# Patient Record
Sex: Female | Born: 1970 | Race: Black or African American | Hispanic: No | Marital: Single | Smoking: Never smoker
Health system: Southern US, Community
[De-identification: ages and names within clinical notes are randomized; demographics above are authoritative.]

## PROBLEM LIST (undated history)

## (undated) DIAGNOSIS — I219 Acute myocardial infarction, unspecified: Secondary | ICD-10-CM

## (undated) DIAGNOSIS — D369 Benign neoplasm, unspecified site: Secondary | ICD-10-CM

## (undated) DIAGNOSIS — I10 Essential (primary) hypertension: Secondary | ICD-10-CM

## (undated) DIAGNOSIS — D573 Sickle-cell trait: Secondary | ICD-10-CM

## (undated) DIAGNOSIS — R51 Headache: Secondary | ICD-10-CM

## (undated) DIAGNOSIS — R0600 Dyspnea, unspecified: Secondary | ICD-10-CM

## (undated) DIAGNOSIS — J45909 Unspecified asthma, uncomplicated: Secondary | ICD-10-CM

## (undated) DIAGNOSIS — I499 Cardiac arrhythmia, unspecified: Secondary | ICD-10-CM

## (undated) DIAGNOSIS — R519 Headache, unspecified: Secondary | ICD-10-CM

## (undated) DIAGNOSIS — Z0282 Encounter for adoption services: Secondary | ICD-10-CM

## (undated) DIAGNOSIS — M1990 Unspecified osteoarthritis, unspecified site: Secondary | ICD-10-CM

## (undated) DIAGNOSIS — E119 Type 2 diabetes mellitus without complications: Secondary | ICD-10-CM

## (undated) DIAGNOSIS — G4730 Sleep apnea, unspecified: Secondary | ICD-10-CM

## (undated) DIAGNOSIS — Z789 Other specified health status: Secondary | ICD-10-CM

## (undated) HISTORY — PX: TUBAL LIGATION: SHX77

## (undated) HISTORY — PX: BREAST SURGERY: SHX581

## (undated) HISTORY — PX: BREAST EXCISIONAL BIOPSY: SUR124

## (undated) HISTORY — DX: Sickle-cell trait: D57.3

---

## 2009-10-15 ENCOUNTER — Emergency Department: Payer: Self-pay | Admitting: Emergency Medicine

## 2012-03-10 ENCOUNTER — Ambulatory Visit: Payer: Self-pay

## 2013-01-03 ENCOUNTER — Emergency Department: Payer: Self-pay | Admitting: Internal Medicine

## 2013-05-29 ENCOUNTER — Ambulatory Visit: Payer: Self-pay | Admitting: Obstetrics and Gynecology

## 2013-05-29 LAB — PREGNANCY, URINE: Pregnancy Test, Urine: NEGATIVE m[IU]/mL

## 2013-05-29 LAB — HEMOGLOBIN: HGB: 13.6 g/dL (ref 12.0–16.0)

## 2013-06-04 ENCOUNTER — Ambulatory Visit: Payer: Self-pay | Admitting: Obstetrics and Gynecology

## 2014-03-08 HISTORY — PX: TUMOR EXCISION: SHX421

## 2014-06-29 NOTE — Op Note (Signed)
PATIENT NAME:  Destiny Singh, Destiny Singh MR#:  [id number] DATE OF BIRTH:  12/28/1970  DATE OF PROCEDURE:  06/04/2013  PREOPERATIVE DIAGNOSIS: 1.  Multiparous,  desiring permanent sterilization.   POSTOPERATIVE DIAGNOSIS:   1.  Multiparous, desiring permanent sterilization.  OPERATION PERFORMED: Includes laparoscopic bilateral tubal ligation.   ANESTHESIA: General.   SURGEON: Rubie Maid, M.D.   EBL: Minimal.  OPERATIVE FLUIDS: 1400 mL of lactated Ringer's.   URINE OUTPUT:  150 mL.   COMPLICATIONS: None.   FINDINGS: On exam under anesthesia, the uterus was sounded to approximately 9 cm. On laparoscopy, there was a normal-appearing uterus, fallopian tubes and ovaries bilaterally. Abdomen and pelvis were noted to be free of any adhesions.   SPECIMENS: There were none.   DETAILS OF PROCEDURE: The patient was taken to the operating room where she was placed under general anesthesia without difficulty. She was then placed in the dorsal lithotomy position using the Allen stirrups and prepped and draped in normal sterile fashion. Next, a straight catheterization was performed with return of 150 mL of clear urine. After this, the speculum was placed into the vagina to visualize the cervix. A single-tooth tenaculum was placed in the anterior lip of the cervix and the uterus was sounded to 9 cm. After this, the sound and the single-tooth tenaculum were removed and a Hulka clamp was placed on the cervix for uterine manipulation. The speculum was then removed from the vagina. Next, attention was then turned to the abdomen where an approximately 5 mm vertical infraumbilical incision was made. After this, a 5 mm trocar and sheath were placed in the abdomen under direct visualization using the laparoscope. Once entrance into the abdominal cavity had been confirmed, a pneumoperitoneum was then established by insufflating the abdomen with CO2 gas. Survey over the abdomen was performed with the above findings noted.  Next, a 5 mm accessory trocar and sheath were placed under direct visualization through a stab incision, approximately 5 fingerbreadths from the umbilicus lateral to the rectus muscles on both sides. Next, the left fallopian tube was grasped approximately 3 cm from the left cornu and fulgurated using bipolar cautery. Two contiguous areas of the tube distal to the initial sites were also cauterized with fulguration noted to extend to the mesosalpinx. This incision was then cut using laparoscopic scissors. The right fallopian tube was then similarly fulgurated in 3 contiguous areas beginning approximately 3 cm from the right cornu with extension of the fulguration to the mesosalpinx. This area was then cut using the laparoscopic scissors. Good hemostasis was noted bilaterally. The accessory sheaths and cautery were removed under direct visualization. The pneumoperitoneum was evacuated. The laparoscope and sheath were then withdrawn and the skin edges were reapproximated using Dermabond. All incisions were then injected using 1% Xylocaine with approximately 10 mL being injected total for all 3 incisions. Instruments were then removed from the vagina and the patient was awakened from general anesthesia without difficulty. Sponge and needle counts were correct.  Estimated blood loss was minimal. There were no complications.   DISPOSITION: The patient will be discharged home later today once awake and alert and discharge medications include Tylenol #3 and Motrin as needed for pain.     ____________________________ Chesley Noon. Marcelline Mates, MD asc:dmm D: 06/04/2013 14:36:36 ET T: 06/04/2013 23:06:57 ET JOB#: [id number]  cc: Chesley Noon. Marcelline Mates, MD, <Dictator> Augusto Gamble MD ELECTRONICALLY SIGNED 06/11/2013 9:07

## 2014-07-25 ENCOUNTER — Other Ambulatory Visit: Payer: Self-pay | Admitting: Nurse Practitioner

## 2014-07-25 ENCOUNTER — Ambulatory Visit
Admission: RE | Admit: 2014-07-25 | Discharge: 2014-07-25 | Disposition: A | Discharge status: Home / Self Care | Payer: Medicaid Other | Source: Ambulatory Visit | Attending: Nurse Practitioner | Admitting: Nurse Practitioner

## 2014-07-25 DIAGNOSIS — M25562 Pain in left knee: Secondary | ICD-10-CM | POA: Diagnosis not present

## 2014-07-25 DIAGNOSIS — M25462 Effusion, left knee: Secondary | ICD-10-CM | POA: Diagnosis not present

## 2014-07-25 DIAGNOSIS — M25569 Pain in unspecified knee: Secondary | ICD-10-CM | POA: Diagnosis present

## 2014-08-09 DIAGNOSIS — M222X9 Patellofemoral disorders, unspecified knee: Secondary | ICD-10-CM | POA: Insufficient documentation

## 2015-02-28 ENCOUNTER — Other Ambulatory Visit: Payer: Self-pay | Admitting: Nurse Practitioner

## 2015-02-28 DIAGNOSIS — M25561 Pain in right knee: Secondary | ICD-10-CM

## 2015-02-28 DIAGNOSIS — M25562 Pain in left knee: Secondary | ICD-10-CM

## 2015-04-17 ENCOUNTER — Emergency Department: Payer: Medicaid Other

## 2015-04-17 ENCOUNTER — Encounter: Payer: Self-pay | Admitting: Emergency Medicine

## 2015-04-17 ENCOUNTER — Emergency Department
Admission: EM | Admit: 2015-04-17 | Discharge: 2015-04-17 | Disposition: A | Discharge status: Home / Self Care | Payer: Medicaid Other | Attending: Emergency Medicine | Admitting: Emergency Medicine

## 2015-04-17 DIAGNOSIS — R112 Nausea with vomiting, unspecified: Secondary | ICD-10-CM | POA: Diagnosis not present

## 2015-04-17 DIAGNOSIS — R1084 Generalized abdominal pain: Secondary | ICD-10-CM | POA: Diagnosis not present

## 2015-04-17 DIAGNOSIS — Z3202 Encounter for pregnancy test, result negative: Secondary | ICD-10-CM | POA: Insufficient documentation

## 2015-04-17 DIAGNOSIS — R1013 Epigastric pain: Secondary | ICD-10-CM | POA: Diagnosis present

## 2015-04-17 LAB — URINALYSIS COMPLETE WITH MICROSCOPIC (ARMC ONLY)
BACTERIA UA: NONE SEEN
Bilirubin Urine: NEGATIVE
Glucose, UA: NEGATIVE mg/dL
LEUKOCYTES UA: NEGATIVE
Nitrite: NEGATIVE
PH: 5 (ref 5.0–8.0)
PROTEIN: NEGATIVE mg/dL
Specific Gravity, Urine: 1.024 (ref 1.005–1.030)

## 2015-04-17 LAB — CBC
HEMATOCRIT: 43.1 % (ref 35.0–47.0)
HEMOGLOBIN: 13.9 g/dL (ref 12.0–16.0)
MCH: 24.9 pg — ABNORMAL LOW (ref 26.0–34.0)
MCHC: 32.3 g/dL (ref 32.0–36.0)
MCV: 77.3 fL — AB (ref 80.0–100.0)
Platelets: 374 10*3/uL (ref 150–440)
RBC: 5.58 MIL/uL — AB (ref 3.80–5.20)
RDW: 16.5 % — AB (ref 11.5–14.5)
WBC: 13.3 10*3/uL — AB (ref 3.6–11.0)

## 2015-04-17 LAB — COMPREHENSIVE METABOLIC PANEL
ALT: 19 U/L (ref 14–54)
AST: 18 U/L (ref 15–41)
Albumin: 3.7 g/dL (ref 3.5–5.0)
Alkaline Phosphatase: 78 U/L (ref 38–126)
Anion gap: 10 (ref 5–15)
BUN: 14 mg/dL (ref 6–20)
CHLORIDE: 104 mmol/L (ref 101–111)
CO2: 22 mmol/L (ref 22–32)
Calcium: 8.9 mg/dL (ref 8.9–10.3)
Creatinine, Ser: 0.7 mg/dL (ref 0.44–1.00)
GFR calc Af Amer: >60 mL/min (ref >60)
Glucose, Bld: 118 mg/dL — ABNORMAL HIGH (ref 65–99)
POTASSIUM: 3.9 mmol/L (ref 3.5–5.1)
Sodium: 136 mmol/L (ref 135–145)
Total Bilirubin: 0.8 mg/dL (ref 0.3–1.2)
Total Protein: 7.5 g/dL (ref 6.5–8.1)

## 2015-04-17 LAB — LIPASE, BLOOD: LIPASE: 22 U/L (ref 11–51)

## 2015-04-17 LAB — PREGNANCY, URINE: PREG TEST UR: NEGATIVE

## 2015-04-17 MED ORDER — SODIUM CHLORIDE 0.9 % IV SOLN
Freq: Once | INTRAVENOUS | Status: AC
  Administered 2015-04-17: 1000 mL via INTRAVENOUS

## 2015-04-17 MED ORDER — MORPHINE SULFATE (PF) 4 MG/ML IV SOLN: 4.0000 mg | Freq: Once | INTRAVENOUS | Status: DC

## 2015-04-17 MED ORDER — MORPHINE SULFATE (PF) 4 MG/ML IV SOLN
4.0000 mg | Freq: Once | INTRAVENOUS | Status: AC
  Administered 2015-04-17: 4 mg via INTRAVENOUS

## 2015-04-17 MED ORDER — IOHEXOL 240 MG/ML SOLN
25.0000 mL | Freq: Once | INTRAMUSCULAR | Status: DC | PRN
  Administered 2015-04-17: 25 mL via ORAL
  Filled 2015-04-17: qty 25

## 2015-04-17 MED ORDER — DICYCLOMINE HCL 20 MG PO TABS: 20.0000 mg | ORAL_TABLET | Freq: Three times a day (TID) | ORAL | Status: DC | PRN

## 2015-04-17 MED ORDER — ONDANSETRON HCL 4 MG PO TABS: 4.0000 mg | ORAL_TABLET | Freq: Every day | ORAL | Status: DC | PRN

## 2015-04-17 MED ORDER — ONDANSETRON HCL 4 MG/2ML IJ SOLN
4.0000 mg | Freq: Once | INTRAMUSCULAR | Status: AC
  Administered 2015-04-17: 4 mg via INTRAVENOUS

## 2015-04-17 MED ORDER — ONDANSETRON 4 MG PO TBDP
4.0000 mg | ORAL_TABLET | Freq: Once | ORAL | Status: AC | PRN
  Administered 2015-04-17: 4 mg via ORAL

## 2015-04-17 MED ORDER — MORPHINE SULFATE (PF) 4 MG/ML IV SOLN
4.0000 mg | Freq: Once | INTRAVENOUS | Status: AC
  Administered 2015-04-17: 4 mg via INTRAVENOUS
  Filled 2015-04-17: qty 1

## 2015-04-17 MED ORDER — ONDANSETRON HCL 4 MG/2ML IJ SOLN
INTRAMUSCULAR | Status: AC
  Administered 2015-04-17: 4 mg via INTRAVENOUS
  Filled 2015-04-17: qty 2

## 2015-04-17 MED ORDER — IOHEXOL 300 MG/ML  SOLN
100.0000 mL | Freq: Once | INTRAMUSCULAR | Status: AC | PRN
  Administered 2015-04-17: 100 mL via INTRAVENOUS
  Filled 2015-04-17: qty 100

## 2015-04-17 MED ORDER — ONDANSETRON 4 MG PO TBDP
ORAL_TABLET | ORAL | Status: AC
  Filled 2015-04-17: qty 1

## 2015-04-17 MED ORDER — MORPHINE SULFATE (PF) 4 MG/ML IV SOLN
INTRAVENOUS | Status: AC
  Administered 2015-04-17: 4 mg via INTRAVENOUS
  Filled 2015-04-17: qty 1

## 2015-04-17 NOTE — ED Notes (Signed)
Vomiting since 230 am, epigastric pain that goes into back and throat

## 2015-04-17 NOTE — ED Notes (Signed)
Took her daughters unknown nausea pill at home - give zofran odt 4mg 

## 2015-04-17 NOTE — ED Provider Notes (Addendum)
[facility] Emergency Department Provider Note     Time seen: ----------------------------------------- 2:00 PM on 04/17/2015 -----------------------------------------    I have reviewed the triage vital signs and the nursing notes.   HISTORY  Chief Complaint Abdominal Pain    HPI 77 [name] is a 45 y.o. female who presents ER for epigastric and left sided abdominal pain that started at 2:30 this morning. She's not had any diarrhea but has had a lot of vomiting. Nothing makes her symptoms better or worse. She denies fevers chills or other complaints, states her daughter has also been sick.   History reviewed. No pertinent past medical history.  There are no active problems to display for this patient.   Past Surgical History  Procedure Laterality Date  . Breast surgery      Allergies Review of patient's allergies indicates no known allergies.  Social History Social History  Substance Use Topics  . Smoking status: Never Smoker   . Smokeless tobacco: None  . Alcohol Use: No    Review of Systems Constitutional: Negative for fever. Eyes: Negative for visual changes. ENT: Negative for sore throat. Cardiovascular: Negative for chest pain. Respiratory: Negative for shortness of breath. Gastrointestinal: Positive for abdominal pain and vomiting Genitourinary: Negative for dysuria. Musculoskeletal: Negative for back pain. Skin: Negative for rash. Neurological: Negative for headaches, focal weakness or numbness.  10-point ROS otherwise negative.  ____________________________________________   PHYSICAL EXAM:  VITAL SIGNS: ED Triage Vitals  Enc Vitals Group     BP 04/17/15 1126 138/92 mmHg     Pulse Rate 04/17/15 1126 108     Resp 04/17/15 1126 18     Temp 04/17/15 1126 98.7 F (37.1 C)     Temp src --      SpO2 04/17/15 1126 100 %     Weight 04/17/15 1126 245 lb (111.131 kg)     Height 04/17/15 1126 5\' 9"  (1.753 m)     Head Cir  --      Peak Flow --      Pain Score 04/17/15 1126 10     Pain Loc --      Pain Edu? --      Excl. in [place]? --     Constitutional: Alert and oriented. Well appearing and in no distress. Eyes: Conjunctivae are normal. PERRL. Normal extraocular movements. ENT   Head: Normocephalic and atraumatic.   Nose: No congestion/rhinnorhea.   Mouth/Throat: Mucous membranes are moist.   Neck: No stridor. Cardiovascular: Normal rate, regular rhythm. Normal and symmetric distal pulses are present in all extremities. No murmurs, rubs, or gallops. Respiratory: Normal respiratory effort without tachypnea nor retractions. Breath sounds are clear and equal bilaterally. No wheezes/rales/rhonchi. Gastrointestinal: Epigastric and left-sided abdominal tenderness, no rebound or guarding. Normal bowel sounds. Musculoskeletal: Nontender with normal range of motion in all extremities. No joint effusions.  No lower extremity tenderness nor edema. Neurologic:  Normal speech and language. No gross focal neurologic deficits are appreciated. Speech is normal. No gait instability. Skin:  Skin is warm, dry and intact. No rash noted. Psychiatric: Mood and affect are normal. Speech and behavior are normal. Patient exhibits appropriate insight and judgment. ____________________________________________  ED COURSE:  Pertinent labs & imaging results that were available during my care of the patient were reviewed by me and considered in my medical decision making (see chart for details). Patient is in no acute distress but does have significant left-sided abdominal tenderness. Obtain CT imaging. ____________________________________________    LABS (  pertinent positives/negatives)  Labs Reviewed  COMPREHENSIVE METABOLIC PANEL - Abnormal; Notable for the following:    Glucose, Bld 118 (*)    All other components within normal limits  CBC - Abnormal; Notable for the following:    WBC 13.3 (*)    RBC 5.58 (*)     MCV 77.3 (*)    MCH 24.9 (*)    RDW 16.5 (*)    All other components within normal limits  LIPASE, BLOOD  URINALYSIS COMPLETEWITH MICROSCOPIC ([facility] ONLY)  POC URINE PREG, ED    RADIOLOGY Images were viewed by me  CT the abdomen and pelvis with contrast IMPRESSION: No acute abnormality noted.  Scattered diverticular are noted. ____________________________________________  FINAL ASSESSMENT AND PLAN  Abdominal pain, vomiting  Plan: Patient with labs and imaging as dictated above. Pain is likely intestinal spasm related. Patient likely with a viral issue. She'll be discharged with Bentyl and Zofran. She is stable for outpatient follow-up.   Earleen Newport, MD   Earleen Newport, MD 04/17/15 [place], MD 04/17/15 [phone]

## 2015-04-17 NOTE — Discharge Instructions (Signed)
Abdominal Pain, Adult °Many things can cause abdominal pain. Usually, abdominal pain is not caused by a disease and will improve without treatment. It can often be observed and treated at home. Your health care provider will do a physical exam and possibly order blood tests and X-rays to help determine the seriousness of your pain. However, in many cases, more time must pass before a clear cause of the pain can be found. Before that point, your health care provider may not know if you need more testing or further treatment. °HOME CARE INSTRUCTIONS °Monitor your abdominal pain for any changes. The following actions may help to alleviate any discomfort you are experiencing: °· Only take over-the-counter or prescription medicines as directed by your health care provider. °· Do not take laxatives unless directed to do so by your health care provider. °· Try a clear liquid diet (broth, tea, or water) as directed by your health care provider. Slowly move to a bland diet as tolerated. °SEEK MEDICAL CARE IF: °· You have unexplained abdominal pain. °· You have abdominal pain associated with nausea or diarrhea. °· You have pain when you urinate or have a bowel movement. °· You experience abdominal pain that wakes you in the night. °· You have abdominal pain that is worsened or improved by eating food. °· You have abdominal pain that is worsened with eating fatty foods. °· You have a fever. °SEEK IMMEDIATE MEDICAL CARE IF: °· Your pain does not go away within 2 hours. °· You keep throwing up (vomiting). °· Your pain is felt only in portions of the abdomen, such as the right side or the left lower portion of the abdomen. °· You pass bloody or black tarry stools. °MAKE SURE YOU: °· Understand these instructions. °· Will watch your condition. °· Will get help right away if you are not doing well or get worse. °  °This information is not intended to replace advice given to you by your health care provider. Make sure you discuss  any questions you have with your health care provider. °  °Document Released: 12/02/2004 Document Revised: 11/13/2014 Document Reviewed: 11/01/2012 °Elsevier Interactive Patient Education ©2016 [employer] ° °Nausea and Vomiting °Nausea is a sick feeling that often comes before throwing up (vomiting). Vomiting is a reflex where stomach contents come out of your mouth. Vomiting can cause severe loss of body fluids (dehydration). Children and elderly adults can become dehydrated quickly, especially if they also have diarrhea. Nausea and vomiting are symptoms of a condition or disease. It is important to find the cause of your symptoms. °CAUSES  °· Direct irritation of the stomach lining. This irritation can result from increased acid production (gastroesophageal reflux disease), infection, food poisoning, taking certain medicines (such as nonsteroidal anti-inflammatory drugs), alcohol use, or tobacco use. °· Signals from the brain. These signals could be caused by a headache, heat exposure, an inner ear disturbance, increased pressure in the brain from injury, infection, a tumor, or a concussion, pain, emotional stimulus, or metabolic problems. °· An obstruction in the gastrointestinal tract (bowel obstruction). °· Illnesses such as diabetes, hepatitis, gallbladder problems, appendicitis, kidney problems, cancer, sepsis, atypical symptoms of a heart attack, or eating disorders. °· Medical treatments such as chemotherapy and radiation. °· Receiving medicine that makes you sleep (general anesthetic) during surgery. °DIAGNOSIS °Your caregiver may ask for tests to be done if the problems do not improve after a few days. Tests may also be done if symptoms are severe or if the reason for the   nausea and vomiting is not clear. Tests may include: °· Urine tests. °· Blood tests. °· Stool tests. °· Cultures (to look for evidence of infection). °· X-rays or other imaging studies. °Test results can help your caregiver make  decisions about treatment or the need for additional tests. °TREATMENT °You need to stay well hydrated. Drink frequently but in small amounts. You may wish to drink water, sports drinks, clear broth, or eat frozen ice pops or gelatin dessert to help stay hydrated. When you eat, eating slowly may help prevent nausea. There are also some antinausea medicines that may help prevent nausea. °HOME CARE INSTRUCTIONS  °· Take all medicine as directed by your caregiver. °· If you do not have an appetite, do not force yourself to eat. However, you must continue to drink fluids. °· If you have an appetite, eat a normal diet unless your caregiver tells you differently. °¨ Eat a variety of complex carbohydrates (rice, wheat, potatoes, bread), lean meats, yogurt, fruits, and vegetables. °¨ Avoid high-fat foods because they are more difficult to digest. °· Drink enough water and fluids to keep your urine clear or pale yellow. °· If you are dehydrated, ask your caregiver for specific rehydration instructions. Signs of dehydration may include: °¨ Severe thirst. °¨ Dry lips and mouth. °¨ Dizziness. °¨ Dark urine. °¨ Decreasing urine frequency and amount. °¨ Confusion. °¨ Rapid breathing or pulse. °SEEK IMMEDIATE MEDICAL CARE IF:  °· You have blood or brown flecks (like coffee grounds) in your vomit. °· You have black or bloody stools. °· You have a severe headache or stiff neck. °· You are confused. °· You have severe abdominal pain. °· You have chest pain or trouble breathing. °· You do not urinate at least once every 8 hours. °· You develop cold or clammy skin. °· You continue to vomit for longer than 24 to 48 hours. °· You have a fever. °MAKE SURE YOU:  °· Understand these instructions. °· Will watch your condition. °· Will get help right away if you are not doing well or get worse. °  °This information is not intended to replace advice given to you by your health care provider. Make sure you discuss any questions you have with  your health care provider. °  °Document Released: 02/22/2005 Document Revised: 05/17/2011 Document Reviewed: 07/22/2010 °Elsevier Interactive Patient Education ©2016 [employer] ° °

## 2015-04-22 ENCOUNTER — Ambulatory Visit
Admission: RE | Admit: 2015-04-22 | Discharge: 2015-04-22 | Disposition: A | Discharge status: Home / Self Care | Payer: Medicaid Other | Source: Ambulatory Visit | Attending: Nurse Practitioner | Admitting: Nurse Practitioner

## 2015-04-22 DIAGNOSIS — M25562 Pain in left knee: Secondary | ICD-10-CM | POA: Insufficient documentation

## 2015-04-22 DIAGNOSIS — M25561 Pain in right knee: Secondary | ICD-10-CM

## 2015-04-22 DIAGNOSIS — M1711 Unilateral primary osteoarthritis, right knee: Secondary | ICD-10-CM | POA: Insufficient documentation

## 2015-06-06 DIAGNOSIS — M2242 Chondromalacia patellae, left knee: Secondary | ICD-10-CM | POA: Insufficient documentation

## 2015-06-06 DIAGNOSIS — M2241 Chondromalacia patellae, right knee: Secondary | ICD-10-CM | POA: Insufficient documentation

## 2015-06-17 ENCOUNTER — Emergency Department
Admission: EM | Admit: 2015-06-17 | Discharge: 2015-06-17 | Disposition: A | Discharge status: Home / Self Care | Payer: Medicaid Other | Attending: Emergency Medicine | Admitting: Emergency Medicine

## 2015-06-17 DIAGNOSIS — Y999 Unspecified external cause status: Secondary | ICD-10-CM | POA: Insufficient documentation

## 2015-06-17 DIAGNOSIS — X509XXA Other and unspecified overexertion or strenuous movements or postures, initial encounter: Secondary | ICD-10-CM | POA: Diagnosis not present

## 2015-06-17 DIAGNOSIS — Y9389 Activity, other specified: Secondary | ICD-10-CM | POA: Insufficient documentation

## 2015-06-17 DIAGNOSIS — Z791 Long term (current) use of non-steroidal anti-inflammatories (NSAID): Secondary | ICD-10-CM | POA: Insufficient documentation

## 2015-06-17 DIAGNOSIS — S39012A Strain of muscle, fascia and tendon of lower back, initial encounter: Secondary | ICD-10-CM | POA: Insufficient documentation

## 2015-06-17 DIAGNOSIS — Y929 Unspecified place or not applicable: Secondary | ICD-10-CM | POA: Insufficient documentation

## 2015-06-17 DIAGNOSIS — M545 Low back pain: Secondary | ICD-10-CM | POA: Diagnosis present

## 2015-06-17 MED ORDER — ORPHENADRINE CITRATE 30 MG/ML IJ SOLN
60.0000 mg | Freq: Once | INTRAMUSCULAR | Status: AC
  Administered 2015-06-17: 60 mg via INTRAMUSCULAR
  Filled 2015-06-17: qty 2

## 2015-06-17 MED ORDER — OXYCODONE HCL 5 MG PO TABS
10.0000 mg | ORAL_TABLET | Freq: Once | ORAL | Status: AC
  Administered 2015-06-17: 10 mg via ORAL
  Filled 2015-06-17: qty 2

## 2015-06-17 MED ORDER — DIAZEPAM 2 MG PO TABS
2.0000 mg | ORAL_TABLET | Freq: Once | ORAL | Status: AC
  Administered 2015-06-17: 2 mg via ORAL
  Filled 2015-06-17: qty 1

## 2015-06-17 MED ORDER — OXYCODONE-ACETAMINOPHEN 5-325 MG PO TABS: 1.0000 | ORAL_TABLET | Freq: Four times a day (QID) | ORAL | Status: DC | PRN

## 2015-06-17 MED ORDER — MELOXICAM 15 MG PO TABS: 15.0000 mg | ORAL_TABLET | Freq: Every day | ORAL | Status: DC

## 2015-06-17 NOTE — ED Notes (Signed)
Pt arrives to ER via POV c/o mid back spasms X 1 [place]. 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 Pt ambulatory but pain when walking. Pt alert and oriented X4, active, cooperative, pt in NAD. RR even and unlabored, color WNL.

## 2015-06-17 NOTE — ED Notes (Addendum)
Pt wheeled to treatment room in wheelchair. Pt states back spasms that go straight up back, denies radiation down legs. Pt states it is difficult for her to stand up straight. Pt states pain began this morning after picking up a pt. Pt states she has a "messed up knee so I can't lift with my knees".

## 2015-06-17 NOTE — ED Notes (Signed)
Called pharmacy to get Norflex injection because pyxis is out.

## 2015-06-17 NOTE — ED Provider Notes (Signed)
CSN: [id number]     Arrival date & time 06/17/15  1935 History   First MD Initiated Contact with Patient 06/17/15 1951     Chief Complaint  Patient presents with  . Back Pain     HPI   45 year old female who presents to the emergency department for evaluation of right mid to lower back pain. She states that while assisting a resident out of her wheelchair, she felt her muscle pull. She states she is now having spasms and a burning sensation. She has had no relief with ibuprofen or ice to hot muscle rub. Pain is much worse with movement.  History reviewed. No pertinent past medical history. Past Surgical History  Procedure Laterality Date  . Breast surgery     No family history on file. Social History  Substance Use Topics  . Smoking status: Never Smoker   . Smokeless tobacco: None  . Alcohol Use: No   OB History    No data available     Review of Systems  Constitutional: Positive for activity change. Negative for fever and chills.  Respiratory: Negative.   Cardiovascular: Negative.   Genitourinary: Negative for dysuria, hematuria, flank pain and difficulty urinating.  Musculoskeletal: Positive for back pain and gait problem.  Skin: Negative for color change, rash and wound.  Neurological: Negative for weakness and numbness.      Allergies  Bee venom  Home Medications   Prior to Admission medications   Medication Sig Start Date End Date Taking? Authorizing Provider  dicyclomine (BENTYL) 20 MG tablet Take 1 tablet (20 mg total) by mouth 3 (three) times daily as needed for spasms. 04/17/15 04/16/16  Earleen Newport, MD  meloxicam (MOBIC) 15 MG tablet Take 1 tablet (15 mg total) by mouth daily. 06/17/15   Victorino Dike, FNP  ondansetron (ZOFRAN) 4 MG tablet Take 1 tablet (4 mg total) by mouth daily as needed for nausea or vomiting. 04/17/15   Earleen Newport, MD  oxyCODONE-acetaminophen (ROXICET) 5-325 MG tablet Take 1 tablet by mouth every 6 (six) hours as needed.  06/17/15   Abiel Antrim B Shady Padron, FNP   BP 111/52 mmHg  Pulse 73  Temp(Src) 97.7 F (36.5 C) (Oral)  Resp 16  Ht 5\' 9"  (1.753 m)  Wt 106.595 kg  BMI 34.69 kg/m2  SpO2 97%  LMP 06/17/2015 Physical Exam  Constitutional: She is oriented to person, place, and time. She appears well-developed and well-nourished.  HENT:  Head: Atraumatic.  Eyes: EOM are normal.  Neck: Normal range of motion. Neck supple.  Pulmonary/Chest: Effort normal.  Abdominal: Soft.  Musculoskeletal:       Lumbar back: She exhibits decreased range of motion, tenderness, pain and spasm. She exhibits no bony tenderness, no swelling, no edema and no deformity.  Neurological: She is alert and oriented to person, place, and time.  Skin: Skin is warm and dry.  Psychiatric: She has a normal mood and affect. Her behavior is normal. Judgment and thought content normal.  Nursing note and vitals reviewed.   ED Course  Procedures (including critical care time) Labs Review Labs Reviewed - No data to display  Imaging Review No results found. I have personally reviewed and evaluated these images and lab results as part of my medical decision-making.   EKG Interpretation None      MDM   Final diagnoses:  Lumbosacral strain, initial encounter    Patient was given Norflex and oxycodone IR while in the emergency department with little relief. She  was then given 2 mg of Valium by mouth with significant relief. She was then able to stand and bear some weight independently. She was advised to stay home for the next 2 days and rest. She was given a work note for the same. She will be prescribed Roxicet 5-3 25 and meloxicam 15 mg tablet. She was instructed to follow-up with her primary care provider for symptoms that are not improving over the next few days. She was instructed to return to the emergency department for symptoms that change or worsen if she is unable schedule an appointment.    Victorino Dike, FNP 06/17/15  [id number]  Earleen Newport, MD 06/17/15 [phone]

## 2015-07-28 ENCOUNTER — Emergency Department
Admission: EM | Admit: 2015-07-28 | Discharge: 2015-07-28 | Disposition: A | Discharge status: Home / Self Care | Payer: Medicaid Other | Attending: Emergency Medicine | Admitting: Emergency Medicine

## 2015-07-28 ENCOUNTER — Encounter: Payer: Self-pay | Admitting: Emergency Medicine

## 2015-07-28 DIAGNOSIS — M545 Low back pain: Secondary | ICD-10-CM | POA: Diagnosis present

## 2015-07-28 DIAGNOSIS — M62830 Muscle spasm of back: Secondary | ICD-10-CM | POA: Diagnosis not present

## 2015-07-28 DIAGNOSIS — I10 Essential (primary) hypertension: Secondary | ICD-10-CM | POA: Diagnosis not present

## 2015-07-28 HISTORY — DX: Essential (primary) hypertension: I10

## 2015-07-28 MED ORDER — DIAZEPAM 5 MG PO TABS
5.0000 mg | ORAL_TABLET | Freq: Once | ORAL | Status: AC
  Administered 2015-07-28: 5 mg via ORAL
  Filled 2015-07-28: qty 1

## 2015-07-28 MED ORDER — IBUPROFEN 800 MG PO TABS: 800.0000 mg | ORAL_TABLET | Freq: Three times a day (TID) | ORAL | Status: DC | PRN

## 2015-07-28 MED ORDER — DIAZEPAM 2 MG PO TABS: 2.0000 mg | ORAL_TABLET | Freq: Three times a day (TID) | ORAL | Status: DC | PRN

## 2015-07-28 MED ORDER — HYDROCODONE-ACETAMINOPHEN 5-325 MG PO TABS
1.0000 | ORAL_TABLET | Freq: Once | ORAL | Status: AC
  Administered 2015-07-28: 1 via ORAL
  Filled 2015-07-28: qty 1

## 2015-07-28 NOTE — ED Notes (Signed)
See triage note   Developed lower back pain this am  W/o injury

## 2015-07-28 NOTE — ED Provider Notes (Signed)
[facility] Emergency Department Provider Note  ____________________________________________  Time seen: Approximately 2:57 PM  I have reviewed the triage vital signs and the nursing notes.   HISTORY  Chief Complaint Back Pain    HPI Destiny Singh is a 45 y.o. female ,NAD, presents to the emergency department with 12 hour history of minimal lower back pain. States that the pain began at 3 AM while she was working. Denies any inciting injury, trauma, fall. Has had back pain several times over the last month. Has been seen by her primary care provider who gave her tizanidine which she states does not help. States she went home after working and took a tizanidine and lay down. States she woke with worsening pain causing her to "fall to her knees".  Does note that the pain can radiate down her right leg. States she asked her daughter to bring her to the emergency department for further treatment. Denies any numbness, weakness, tingling. Denies any lower back pain nor hip pain. No saddle paresthesias nor loss of bowel or bladder control. Patient does note she has not taken her blood pressure medication today, propranolol, because she states it makes her somnolent.   Past Medical History  Diagnosis Date  . Hypertension     There are no active problems to display for this patient.   Past Surgical History  Procedure Laterality Date  . Breast surgery      Current Outpatient Rx  Name  Route  Sig  Dispense  Refill  . diazepam (VALIUM) 2 MG tablet   Oral   Take 1 tablet (2 mg total) by mouth every 8 (eight) hours as needed.   21 tablet   0   . ibuprofen (ADVIL,MOTRIN) 800 MG tablet   Oral   Take 1 tablet (800 mg total) by mouth every 8 (eight) hours as needed (pain).   60 tablet   0     Allergies Bee venom and Other  No family history on file.  Social History Social History  Substance Use Topics  . Smoking status: Never Smoker   . Smokeless tobacco:  None  . Alcohol Use: No     Review of Systems  Constitutional: No fever/chills, fatigue Eyes: No visual changes.  Cardiovascular: No chest pain. Respiratory: No shortness of breath. No wheezing.  Gastrointestinal: No abdominal pain.  No nausea, vomiting.  Musculoskeletal: Positive for back pain. Negative lower extremity pain. Skin: Negative for rash, bruising, redness, swelling, skin sores. Neurological: Negative for headaches, focal weakness or numbness. No saddle paresthesias, loss of bowel or bladder control 10-point ROS otherwise negative.  ____________________________________________   PHYSICAL EXAM:  VITAL SIGNS: ED Triage Vitals  Enc Vitals Group     BP 07/28/15 1346 169/100 mmHg     Pulse Rate 07/28/15 1346 105     Resp 07/28/15 1346 20     Temp 07/28/15 1346 98.6 F (37 C)     Temp Source 07/28/15 1346 Oral     SpO2 07/28/15 1346 96 %     Weight 07/28/15 1346 235 lb (106.595 kg)     Height 07/28/15 1346 5\' 9"  (1.753 m)     Head Cir --      Peak Flow --      Pain Score 07/28/15 1348 10     Pain Loc --      Pain Edu? --      Excl. in [place]? --      Constitutional: Alert and oriented. Well  appearing and in no acute distress but in pain. Eyes: Conjunctivae are normal.  Head: Atraumatic. Neck: Supple with full range of motion. No cervical spine tenderness to palpation. No trapezial muscle spasms noted. Hematological/Lymphatic/Immunilogical: No cervical lymphadenopathy. Cardiovascular: Normal rate, regular rhythm. Normal S1 and S2.  Good peripheral circulation. Respiratory: Normal respiratory effort without tachypnea or retractions. Lungs CTAB with breath sounds noted in all lung fields. Musculoskeletal:  Mild lower thoracic spine tenderness to palpation without step offs or crepitus. Right thoracic muscle spasm. No tenderness to palpation about the lumbar spine. No SI joint tenderness. No lower extremity tenderness nor edema.  No joint effusions. Neurologic:  Normal  speech and language. No gross focal neurologic deficits are appreciated.  Skin:  Skin is warm, dry and intact. No rash noted. Psychiatric: Mood and affect are normal. Speech and behavior are normal. Patient exhibits appropriate insight and judgement.   ____________________________________________   LABS  None  ____________________________________________  EKG  None ____________________________________________  RADIOLOGY  None ____________________________________________    PROCEDURES  Procedure(s) performed: None    Medications  diazepam (VALIUM) tablet 5 mg (5 mg Oral Given 07/28/15 1524)  HYDROcodone-acetaminophen (NORCO/VICODIN) 5-325 MG per tablet 1 tablet (1 tablet Oral Given 07/28/15 1524)     ____________________________________________   INITIAL IMPRESSION / ASSESSMENT AND PLAN / ED COURSE  Patient's diagnosis is consistent with Muscle spasm of back. Patient will be discharged home with prescriptions for diazepam and ibuprofen to take as directed. Patient was making a phone call to her primary care provider in regards to taking her propranolol in conjunction with the diazepam. Discussed with the patient that the combination of diazepam and propranolol would increase her somnolence and may not be safe to take. Patient does not know her resting heart rate when she takes the propranolol. At this time I have advised her not to take the propranolol while taking diazepam until she is able to speak with her primary care provider. She will monitor her blood pressure and pulse while at home and if remains elevated or has any onset of life-threatening symptoms she is to return to this emergency department for further evaluation. Patient is to follow up with her primary care provider if symptoms of muscle spasms persist past this treatment course. Patient is given ED precautions to return to the ED for any worsening or new symptoms.     ____________________________________________  FINAL CLINICAL IMPRESSION(S) / ED DIAGNOSES  Final diagnoses:  Muscle spasm of back      NEW MEDICATIONS STARTED DURING THIS VISIT:  Discharge Medication List as of 07/28/2015  4:40 PM    START taking these medications   Details  diazepam (VALIUM) 2 MG tablet Take 1 tablet (2 mg total) by mouth every 8 (eight) hours as needed., Starting 07/28/2015, Until Tue 07/27/16, Print    ibuprofen (ADVIL,MOTRIN) 800 MG tablet Take 1 tablet (800 mg total) by mouth every 8 (eight) hours as needed (pain)., Starting 07/28/2015, Until Discontinued, [place], PA-C 07/28/15 1704  Nance Pear, MD 07/28/15 1807

## 2015-07-28 NOTE — Discharge Instructions (Signed)
Back Exercises The following exercises strengthen the muscles that help to support the back. They also help to keep the lower back flexible. Doing these exercises can help to prevent back pain or lessen existing pain. If you have back pain or discomfort, try doing these exercises 2-3 times each [place] or as told by your health care provider. When the pain goes away, do them once each [place], but increase the number of times that you repeat the steps for each exercise (do more repetitions). If you do not have back pain or discomfort, do these exercises once each [place] or as told by your health care provider. EXERCISES Single Knee to Chest Repeat these steps 3-5 times for each leg:  Lie on your back on a firm bed or the floor with your legs extended.  Bring one knee to your chest. Your other leg should stay extended and in contact with the floor.  Hold your knee in place by grabbing your knee or thigh.  Pull on your knee until you feel a gentle stretch in your lower back.  Hold the stretch for 10-30 seconds.  Slowly release and straighten your leg. Pelvic Tilt Repeat these steps 5-10 times:  Lie on your back on a firm bed or the floor with your legs extended.  Bend your knees so they are pointing toward the ceiling and your feet are flat on the floor.  Tighten your lower abdominal muscles to press your lower back against the floor. This motion will tilt your pelvis so your tailbone points up toward the ceiling instead of pointing to your feet or the floor.  With gentle tension and even breathing, hold this position for 5-10 seconds. Cat-Cow Repeat these steps until your lower back becomes more flexible:  Get into a hands-and-knees position on a firm surface. Keep your hands under your shoulders, and keep your knees under your hips. You may place padding under your knees for comfort.  Let your head hang down, and point your tailbone toward the floor so your lower back becomes rounded like the  back of a cat.  Hold this position for 5 seconds.  Slowly lift your head and point your tailbone up toward the ceiling so your back forms a sagging arch like the back of a cow.  Hold this position for 5 seconds. Press-Ups Repeat these steps 5-10 times:  Lie on your abdomen (face-down) on the floor.  Place your palms near your head, about shoulder-width apart.  While you keep your back as relaxed as possible and keep your hips on the floor, slowly straighten your arms to raise the top half of your body and lift your shoulders. Do not use your back muscles to raise your upper torso. You may adjust the placement of your hands to make yourself more comfortable.  Hold this position for 5 seconds while you keep your back relaxed.  Slowly return to lying flat on the floor. Bridges Repeat these steps 10 times:  Lie on your back on a firm surface.  Bend your knees so they are pointing toward the ceiling and your feet are flat on the floor.  Tighten your buttocks muscles and lift your buttocks off of the floor until your waist is at almost the same height as your knees. You should feel the muscles working in your buttocks and the back of your thighs. If you do not feel these muscles, slide your feet 1-2 inches farther away from your buttocks.  Hold this position for 3-5  seconds.  Slowly lower your hips to the starting position, and allow your buttocks muscles to relax completely. If this exercise is too easy, try doing it with your arms crossed over your chest. Abdominal Crunches Repeat these steps 5-10 times: 1. Lie on your back on a firm bed or the floor with your legs extended. 2. Bend your knees so they are pointing toward the ceiling and your feet are flat on the floor. 3. Cross your arms over your chest. 4. Tip your chin slightly toward your chest without bending your neck. 5. Tighten your abdominal muscles and slowly raise your trunk (torso) high enough to lift your shoulder blades  a tiny bit off of the floor. Avoid raising your torso higher than that, because it can put too much stress on your low back and it does not help to strengthen your abdominal muscles. 6. Slowly return to your starting position. Back Lifts Repeat these steps 5-10 times: 1. Lie on your abdomen (face-down) with your arms at your sides, and rest your forehead on the floor. 2. Tighten the muscles in your legs and your buttocks. 3. Slowly lift your chest off of the floor while you keep your hips pressed to the floor. Keep the back of your head in line with the curve in your back. Your eyes should be looking at the floor. 4. Hold this position for 3-5 seconds. 5. Slowly return to your starting position. SEEK MEDICAL CARE IF:  Your back pain or discomfort gets much worse when you do an exercise.  Your back pain or discomfort does not lessen within 2 hours after you exercise. If you have any of these problems, stop doing these exercises right away. Do not do them again unless your health care provider says that you can. SEEK IMMEDIATE MEDICAL CARE IF:  You develop sudden, severe back pain. If this happens, stop doing the exercises right away. Do not do them again unless your health care provider says that you can.   This information is not intended to replace advice given to you by your health care provider. Make sure you discuss any questions you have with your health care provider.   Document Released: 04/01/2004 Document Revised: 11/13/2014 Document Reviewed: 04/18/2014 Elsevier Interactive Patient Education 2016 [place] therapy can help ease sore, stiff, injured, and tight muscles and joints. Heat relaxes your muscles, which may help ease your pain. Heat therapy should only be used on old, pre-existing, or long-lasting (chronic) injuries. Do not use heat therapy unless told by your doctor. HOW TO USE HEAT THERAPY There are several different kinds of heat therapy,  including:  Moist heat pack.  Warm water bath.  Hot water bottle.  Electric heating pad.  Heated gel pack.  Heated wrap.  Electric heating pad. GENERAL HEAT THERAPY RECOMMENDATIONS   Do not sleep while using heat therapy. Only use heat therapy while you are awake.  Your skin may turn pink while using heat therapy. Do not use heat therapy if your skin turns red.  Do not use heat therapy if you have new pain.  High heat or long exposure to heat can cause burns. Be careful when using heat therapy to avoid burning your skin.  Do not use heat therapy on areas of your skin that are already irritated, such as with a rash or sunburn. GET HELP IF:   You have blisters, redness, swelling (puffiness), or numbness.  You have new pain.  Your pain is worse.  MAKE SURE YOU:  Understand these instructions.  Will watch your condition.  Will get help right away if you are not doing well or get worse.   This information is not intended to replace advice given to you by your health care provider. Make sure you discuss any questions you have with your health care provider.   Document Released: 05/17/2011 Document Revised: 03/15/2014 Document Reviewed: 04/17/2013 Elsevier Interactive Patient Education 2016 Elsevier Inc.  Muscle Cramps and Spasms Muscle cramps and spasms occur when a muscle or muscles tighten and you have no control over this tightening (involuntary muscle contraction). They are a common problem and can develop in any muscle. The most common place is in the calf muscles of the leg. Both muscle cramps and muscle spasms are involuntary muscle contractions, but they also have differences:   Muscle cramps are sporadic and painful. They may last a few seconds to a quarter of an hour. Muscle cramps are often more forceful and last longer than muscle spasms.  Muscle spasms may or may not be painful. They may also last just a few seconds or much longer. CAUSES  It is uncommon  for cramps or spasms to be due to a serious underlying problem. In many cases, the cause of cramps or spasms is unknown. Some common causes are:   Overexertion.   Overuse from repetitive motions (doing the same thing over and over).   Remaining in a certain position for a long period of time.   Improper preparation, form, or technique while performing a sport or activity.   Dehydration.   Injury.   Side effects of some medicines.   Abnormally low levels of the salts and ions in your blood (electrolytes), especially potassium and calcium. This could happen if you are taking water pills (diuretics) or you are pregnant.  Some underlying medical problems can make it more likely to develop cramps or spasms. These include, but are not limited to:   Diabetes.   Parkinson disease.   Hormone disorders, such as thyroid problems.   Alcohol abuse.   Diseases specific to muscles, joints, and bones.   Blood vessel disease where not enough blood is getting to the muscles.  HOME CARE INSTRUCTIONS   Stay well hydrated. Drink enough water and fluids to keep your urine clear or pale yellow.  It may be helpful to massage, stretch, and relax the affected muscle.  For tight or tense muscles, use a warm towel, heating pad, or hot shower water directed to the affected area.  If you are sore or have pain after a cramp or spasm, applying ice to the affected area may relieve discomfort.  Put ice in a plastic bag.  Place a towel between your skin and the bag.  Leave the ice on for 15-20 minutes, 03-04 times a [place].  Medicines used to treat a known cause of cramps or spasms may help reduce their frequency or severity. Only take over-the-counter or prescription medicines as directed by your caregiver. SEEK MEDICAL CARE IF:  Your cramps or spasms get more severe, more frequent, or do not improve over time.  MAKE SURE YOU:   Understand these instructions.  Will watch your  condition.  Will get help right away if you are not doing well or get worse.   This information is not intended to replace advice given to you by your health care provider. Make sure you discuss any questions you have with your health care provider.   Document Released: 08/14/2001  Document Revised: 06/19/2012 Document Reviewed: 02/09/2012 Elsevier Interactive Patient Education [employer].

## 2015-07-28 NOTE — ED Notes (Signed)
Back pain since early this am, history of same times several months. No new injury.

## 2015-11-16 ENCOUNTER — Encounter: Payer: Self-pay | Admitting: *Deleted

## 2015-11-16 ENCOUNTER — Emergency Department
Admission: EM | Admit: 2015-11-16 | Discharge: 2015-11-16 | Disposition: A | Discharge status: Home / Self Care | Payer: Medicaid Other | Attending: Emergency Medicine | Admitting: Emergency Medicine

## 2015-11-16 DIAGNOSIS — S8391XA Sprain of unspecified site of right knee, initial encounter: Secondary | ICD-10-CM | POA: Insufficient documentation

## 2015-11-16 DIAGNOSIS — X501XXA Overexertion from prolonged static or awkward postures, initial encounter: Secondary | ICD-10-CM | POA: Insufficient documentation

## 2015-11-16 DIAGNOSIS — Z791 Long term (current) use of non-steroidal anti-inflammatories (NSAID): Secondary | ICD-10-CM | POA: Diagnosis not present

## 2015-11-16 DIAGNOSIS — I10 Essential (primary) hypertension: Secondary | ICD-10-CM | POA: Diagnosis not present

## 2015-11-16 DIAGNOSIS — Y990 Civilian activity done for income or pay: Secondary | ICD-10-CM | POA: Insufficient documentation

## 2015-11-16 DIAGNOSIS — Y92009 Unspecified place in unspecified non-institutional (private) residence as the place of occurrence of the external cause: Secondary | ICD-10-CM | POA: Insufficient documentation

## 2015-11-16 DIAGNOSIS — Y9389 Activity, other specified: Secondary | ICD-10-CM | POA: Insufficient documentation

## 2015-11-16 DIAGNOSIS — S8392XA Sprain of unspecified site of left knee, initial encounter: Secondary | ICD-10-CM | POA: Diagnosis not present

## 2015-11-16 DIAGNOSIS — S8992XA Unspecified injury of left lower leg, initial encounter: Secondary | ICD-10-CM | POA: Diagnosis present

## 2015-11-16 MED ORDER — HYDROCODONE-ACETAMINOPHEN 5-325 MG PO TABS: 1.0000 | ORAL_TABLET | ORAL | 0 refills | Status: DC | PRN

## 2015-11-16 NOTE — Discharge Instructions (Signed)
Ice to knees as needed for swelling. Wear brace that you have at home on your right knee for support. Continue taking ibuprofen as needed for inflammation and pain. Norco as needed for severe pain. Be aware that this medication could cause drowsiness and increased your  risk for falling. Follow-up with your primary care doctor if any continued problems.

## 2015-11-16 NOTE — ED Triage Notes (Signed)
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

## 2015-11-16 NOTE — ED Provider Notes (Signed)
[facility] Emergency Department Provider Note  ____________________________________________   First MD Initiated Contact with Patient 11/16/15 [phone]     (approximate)  I have reviewed the triage vital signs and the nursing notes.   HISTORY  Chief Complaint Knee Pain   HPI Destiny Singh is a 45 y.o. female is here with complaint of bilateral knee pain. 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 Patient states that she twisted both knees while getting the resident the floor. Patient states that she has pre-existing injury of both knees from 1-1/2 years ago.Patient states that she does not want to file Workmen's Comp. because of all of her pre-existing knee problems. Patient states she also has rheumatoid arthritis but does not take any medications other than ibuprofen. Patient has continued to ambulate without assistance. She states that her knees are painful with walking. She denies any direct trauma to her knees and states that it was a twisting motion that has flared her knees up. Currently she rates her pain as 10 out of 10.   Past Medical History:  Diagnosis Date  . Hypertension     There are no active problems to display for this patient.   Past Surgical History:  Procedure Laterality Date  . BREAST SURGERY      Prior to Admission medications   Medication Sig Start Date End Date Taking? Authorizing Provider  diazepam (VALIUM) 2 MG tablet Take 1 tablet (2 mg total) by mouth every 8 (eight) hours as needed. 07/28/15 07/27/16  Jami L Hagler, PA-C  HYDROcodone-acetaminophen (NORCO/VICODIN) 5-325 MG tablet Take 1 tablet by mouth every Destiny (four) hours as needed for moderate pain. 11/16/15   Johnn Hai, PA-C  ibuprofen (ADVIL,MOTRIN) 800 MG tablet Take 1 tablet (800 mg total) by mouth every 8 (eight) hours as needed (pain). 07/28/15   Jami L Hagler, PA-C    Allergies Bee venom and Other  History reviewed. No  pertinent family history.  Social History Social History  Substance Use Topics  . Smoking status: Never Smoker  . Smokeless tobacco: Not on file  . Alcohol use No    Review of Systems Constitutional: No fever/chills ENT: No sore throat. Cardiovascular: Denies chest pain. Respiratory: Denies shortness of breath. Gastrointestinal:   No nausea, no vomiting.   Musculoskeletal: Positive history for bilateral knee pain. Skin: Negative for rash. Neurological: Negative for headaches, focal weakness or numbness.  10-point ROS otherwise negative.  ____________________________________________   PHYSICAL EXAM:  VITAL SIGNS: ED Triage Vitals  Enc Vitals Group     BP 11/16/15 0830 128/81     Pulse Rate 11/16/15 0830 91     Resp 11/16/15 0830 18     Temp 11/16/15 0830 98.2 F (36.8 C)     Temp Source 11/16/15 0830 Oral     SpO2 11/16/15 0830 97 %     Weight 11/16/15 0830 230 lb (104.3 kg)     Height 11/16/15 0830 5\' 9"  (1.753 m)     Head Circumference --      Peak Flow --      Pain Score 11/16/15 0831 10     Pain Loc --      Pain Edu? --      Excl. in [place]? --     Constitutional: Alert and oriented. Well appearing and in no acute distress. Eyes: Conjunctivae are normal. PERRL. EOMI. Head: Atraumatic. Nose: No congestion/rhinnorhea. Neck: No stridor.   Cardiovascular:  Normal rate, regular rhythm. Grossly normal heart sounds.  Good peripheral circulation. Respiratory: Normal respiratory effort.  No retractions. Lungs CTAB. Musculoskeletal: On examination of the lower legs there are no gross deformities however there is degenerative changes noted in both knees. Range of motion is slow but patient is able. There is no effusion present. There is some soft tissue edema present. There is crepitus on range of motion. Range of motion is slow and guarded. Ligaments are stable bilaterally. Neurologic:  Normal speech and language. No gross focal neurologic deficits are appreciated. No gait  instability. Skin:  Skin is warm, dry and intact. No ecchymosis, abrasions, erythema was noted. Psychiatric: Mood and affect are normal. Speech and behavior are normal.  ____________________________________________   LABS (all labs ordered are listed, but only abnormal results are displayed)  Labs Reviewed - No data to display  PROCEDURES  Procedure(s) performed: None  Procedures  Critical Care performed: No  ____________________________________________   INITIAL IMPRESSION / ASSESSMENT AND PLAN / ED COURSE  Pertinent labs & imaging results that were available during my care of the patient were reviewed by me and considered in my medical decision making (see chart for details).    Clinical Course  X-rays were deferred at this time since there was no direct trauma to the knees. Patient has a knee immobilizer at home and wear on her right knee since at this time the right one hurts worse than the left. Patient has ibuprofen at home. She will continue taking this medication. She was given a prescription for Norco as needed for severe pain. She is instructed to use ice and elevate as needed. 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 She'll follow-up with her primary care doctor if any continued problems.   ____________________________________________   FINAL CLINICAL IMPRESSION(S) / ED DIAGNOSES  Final diagnoses:  Knee sprain, left, initial encounter  Knee sprain, right, initial encounter      NEW MEDICATIONS STARTED DURING THIS VISIT:  Discharge Medication List as of 11/16/2015  9:15 AM    START taking these medications   Details  HYDROcodone-acetaminophen (NORCO/VICODIN) 5-325 MG tablet Take 1 tablet by mouth every Destiny (four) hours as needed for moderate pain., Starting Sun 11/16/2015, Print         Note:  This document was prepared using Dragon voice recognition software and may include unintentional dictation errors.    Johnn Hai,  PA-C 11/16/15 [place], MD 11/16/15 [phone]

## 2016-01-12 ENCOUNTER — Other Ambulatory Visit: Payer: Self-pay | Admitting: Nurse Practitioner

## 2016-01-12 DIAGNOSIS — Z1231 Encounter for screening mammogram for malignant neoplasm of breast: Secondary | ICD-10-CM

## 2016-01-13 ENCOUNTER — Emergency Department: Payer: Medicaid Other

## 2016-01-13 ENCOUNTER — Emergency Department
Admission: EM | Admit: 2016-01-13 | Discharge: 2016-01-13 | Disposition: A | Discharge status: Home / Self Care | Payer: Medicaid Other | Attending: Emergency Medicine | Admitting: Emergency Medicine

## 2016-01-13 ENCOUNTER — Encounter: Payer: Self-pay | Admitting: Emergency Medicine

## 2016-01-13 DIAGNOSIS — S60221A Contusion of right hand, initial encounter: Secondary | ICD-10-CM | POA: Diagnosis not present

## 2016-01-13 DIAGNOSIS — Y9389 Activity, other specified: Secondary | ICD-10-CM | POA: Diagnosis not present

## 2016-01-13 DIAGNOSIS — S39012A Strain of muscle, fascia and tendon of lower back, initial encounter: Secondary | ICD-10-CM | POA: Insufficient documentation

## 2016-01-13 DIAGNOSIS — X500XXA Overexertion from strenuous movement or load, initial encounter: Secondary | ICD-10-CM | POA: Diagnosis not present

## 2016-01-13 DIAGNOSIS — Y990 Civilian activity done for income or pay: Secondary | ICD-10-CM | POA: Insufficient documentation

## 2016-01-13 DIAGNOSIS — Z79899 Other long term (current) drug therapy: Secondary | ICD-10-CM | POA: Diagnosis not present

## 2016-01-13 DIAGNOSIS — Z791 Long term (current) use of non-steroidal anti-inflammatories (NSAID): Secondary | ICD-10-CM | POA: Insufficient documentation

## 2016-01-13 DIAGNOSIS — Y929 Unspecified place or not applicable: Secondary | ICD-10-CM | POA: Diagnosis not present

## 2016-01-13 DIAGNOSIS — I10 Essential (primary) hypertension: Secondary | ICD-10-CM | POA: Insufficient documentation

## 2016-01-13 DIAGNOSIS — S3992XA Unspecified injury of lower back, initial encounter: Secondary | ICD-10-CM | POA: Diagnosis present

## 2016-01-13 MED ORDER — IBUPROFEN 800 MG PO TABS: 800.0000 mg | ORAL_TABLET | Freq: Three times a day (TID) | ORAL | 0 refills | Status: DC | PRN

## 2016-01-13 MED ORDER — OXYCODONE-ACETAMINOPHEN 7.5-325 MG PO TABS: 1.0000 | ORAL_TABLET | Freq: Four times a day (QID) | ORAL | 0 refills | Status: DC | PRN

## 2016-01-13 MED ORDER — ORPHENADRINE CITRATE 30 MG/ML IJ SOLN
60.0000 mg | Freq: Two times a day (BID) | INTRAMUSCULAR | Status: DC
  Administered 2016-01-13: 60 mg via INTRAMUSCULAR
  Filled 2016-01-13: qty 2

## 2016-01-13 MED ORDER — HYDROMORPHONE HCL 1 MG/ML IJ SOLN
1.0000 mg | Freq: Once | INTRAMUSCULAR | Status: AC
  Administered 2016-01-13: 1 mg via INTRAMUSCULAR
  Filled 2016-01-13: qty 1

## 2016-01-13 MED ORDER — CYCLOBENZAPRINE HCL 10 MG PO TABS: 10.0000 mg | ORAL_TABLET | Freq: Three times a day (TID) | ORAL | 0 refills | Status: DC | PRN

## 2016-01-13 NOTE — ED Triage Notes (Signed)
Pt to ed with c/o back pain and right wrist/hand pain.  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

## 2016-01-13 NOTE — ED Notes (Signed)
States she developed pain to right side of back which moves into right leg yesterday  Also having pain to right hand  States she feels like she has jammed it   Min swelling noted  Ambulates slowly to room w/o limp

## 2016-01-13 NOTE — ED Provider Notes (Signed)
[facility] Emergency Department Provider Note   ____________________________________________   First MD Initiated Contact with Patient 01/13/16 [phone]     (approximate)  I have reviewed the triage vital signs and the nursing notes.   HISTORY  Chief Complaint Hand Pain and Back Pain    HPI Destiny Singh is a 45 y.o. 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 Patient state she hit her hand after trying to lift a patient when she felt a catch in her back. Incident occurred yesterday. Patient states she is noticing edema to the dorsal aspect of her right hand. Patient states she is having pain with flexion and extension of her back.  Patient denies any bladder or bowel dysfunction. Patient stated the pain radiates down her right leg. No palliative measures taken for this complaint.Patient is rating her back pain as a 10 over 10. Patient rates her hand pain as a 5/10. Patient describes back pain as  "sharp". She described her hand pain as "dull".   Past Medical History:  Diagnosis Date  . Hypertension     There are no active problems to display for this patient.   Past Surgical History:  Procedure Laterality Date  . BREAST SURGERY      Prior to Admission medications   Medication Sig Start Date End Date Taking? Authorizing Provider  ciclopirox (LOPROX) 0.77 % cream Apply 1 application topically 2 (two) times daily.  01/05/16  Yes Historical Provider, MD  diazepam (VALIUM) 2 MG tablet Take 1 tablet (2 mg total) by mouth every 8 (eight) hours as needed. 07/28/15 07/27/16 Yes Jami L Hagler, PA-C  doxycycline (VIBRA-TABS) 100 MG tablet Take 1 tablet by mouth daily. 1 tab for twice daily for 14 days - then 1 tab once daily. 12/19/15  Yes Historical Provider, MD  ibuprofen (ADVIL,MOTRIN) 800 MG tablet Take 1 tablet (800 mg total) by mouth every 8 (eight) hours as needed (pain). 07/28/15  Yes Jami L Hagler,  PA-C  propranolol ER (INDERAL LA) 60 MG 24 hr capsule Take 1 capsule by mouth daily. 12/19/15  Yes Historical Provider, MD  traMADol (ULTRAM) 50 MG tablet Take 50 mg by mouth every 12 (twelve) hours as needed.  12/19/15  Yes Historical Provider, MD  cyclobenzaprine (FLEXERIL) 10 MG tablet Take 1 tablet (10 mg total) by mouth 3 (three) times daily as needed. 01/13/16   Sable Feil, PA-C  ibuprofen (ADVIL,MOTRIN) 800 MG tablet Take 1 tablet (800 mg total) by mouth every 8 (eight) hours as needed for moderate pain. 01/13/16   Sable Feil, PA-C  oxyCODONE-acetaminophen (PERCOCET) 7.5-325 MG tablet Take 1 tablet by mouth every 6 (six) hours as needed for severe pain. 01/13/16   Sable Feil, PA-C    Allergies Bee venom and Other  Family History  Problem Relation Age of Onset  . Adopted: Yes    Social History Social History  Substance Use Topics  . Smoking status: Never Smoker  . Smokeless tobacco: Never Used  . Alcohol use No    Review of Systems Constitutional: No fever/chills Eyes: No visual changes. ENT: No sore throat. Cardiovascular: Denies chest pain. Respiratory: Denies shortness of breath. Gastrointestinal: No abdominal pain.  No nausea, no vomiting.  No diarrhea.  No constipation. Genitourinary: Negative for dysuria. Musculoskeletal: Positive for back pain. Skin: Negative for rash. Neurological: Negative for headaches, focal weakness or numbness. Endocrine:Hypertension Allergic/Immunilogical: Bee sting ___________________________________________  PHYSICAL EXAM:  VITAL SIGNS: ED Triage Vitals  Enc Vitals Group     BP 01/13/16 0755 (!) 149/78     Pulse Rate 01/13/16 0755 80     Resp 01/13/16 0755 16     Temp 01/13/16 0755 97.5 F (36.4 C)     Temp Source 01/13/16 0755 Oral     SpO2 01/13/16 0755 97 %     Weight 01/13/16 0752 230 lb (104.3 kg)     Height --      Head Circumference --      Peak Flow --      Pain Score 01/13/16 0752 10     Pain Loc --       Pain Edu? --      Excl. in [place]? --     Constitutional: Alert and oriented. Well appearing and in no acute distress. Obesity Eyes: Conjunctivae are normal. PERRL. EOMI. Head: Atraumatic. Nose: No congestion/rhinnorhea. Mouth/Throat: Mucous membranes are moist.  Oropharynx non-erythematous. Neck: No stridor.  No cervical spine tenderness to palpation. Hematological/Lymphatic/Immunilogical: No cervical lymphadenopathy. Cardiovascular: Normal rate, regular rhythm. Grossly normal heart sounds.  Good peripheral circulation. Respiratory: Normal respiratory effort.  No retractions. Lungs CTAB. Gastrointestinal: Soft and nontender. No distention. No abdominal bruits. No CVA tenderness. Musculoskeletal: Exam is limited by patient body habitus. No obvious spinal deformity. Patient has some moderate guarding palpation of L3-S1. Patient decreased range of motion with lateral movements results and paraspinal muscle spasms. Patient has negative straight leg test. No obvious deformity to the right hand. There is some mild edema to the dorsal aspect over the third metacarpal head. Patient has full nuchal range of motion of the hand. Neurologic:  Normal speech and language. No gross focal neurologic deficits are appreciated. No gait instability. Skin:  Skin is warm, dry and intact. No rash noted. Psychiatric: Mood and affect are normal. Speech and behavior are normal.  ____________________________________________   LABS (all labs ordered are listed, but only abnormal results are displayed)  Labs Reviewed - No data to display ____________________________________________  EKG   ____________________________________________  RADIOLOGY  No acute findings x-ray of the right hand. ____________________________________________   PROCEDURES  Procedure(s) performed: None  Procedures  Critical Care performed: No  ____________________________________________   INITIAL IMPRESSION / ASSESSMENT AND  PLAN / ED COURSE  Pertinent labs & imaging results that were available during my care of the patient were reviewed by me and considered in my medical decision making (see chart for details).  Right hand contusion. Discussed x-ray finding with patient. Low back strain. Patient given discharge Instructions. Patient given a prescription for Percocet, ibuprofen and Flexeril. Patient given a work note.  Clinical Course      ____________________________________________   FINAL CLINICAL IMPRESSION(S) / ED DIAGNOSES  Final diagnoses:  Contusion of right hand, initial encounter  Strain of lumbar region, initial encounter      NEW MEDICATIONS STARTED DURING THIS VISIT:  New Prescriptions   CYCLOBENZAPRINE (FLEXERIL) 10 MG TABLET    Take 1 tablet (10 mg total) by mouth 3 (three) times daily as needed.   IBUPROFEN (ADVIL,MOTRIN) 800 MG TABLET    Take 1 tablet (800 mg total) by mouth every 8 (eight) hours as needed for moderate pain.   OXYCODONE-ACETAMINOPHEN (PERCOCET) 7.5-325 MG TABLET    Take 1 tablet by mouth every 6 (six) hours as needed for severe pain.     Note:  This document was prepared using Dragon voice recognition software and may include unintentional dictation errors.  Sable Feil, PA-C 01/13/16 [id number]    Lavonia Drafts, MD 01/13/16 [id number]

## 2016-02-06 ENCOUNTER — Other Ambulatory Visit: Payer: Self-pay | Admitting: Physician Assistant

## 2016-02-06 DIAGNOSIS — M2391 Unspecified internal derangement of right knee: Secondary | ICD-10-CM

## 2016-02-19 ENCOUNTER — Ambulatory Visit: Payer: Medicaid Other

## 2016-02-19 ENCOUNTER — Ambulatory Visit
Admission: RE | Admit: 2016-02-19 | Discharge: 2016-02-19 | Disposition: A | Discharge status: Home / Self Care | Payer: Medicaid Other | Source: Ambulatory Visit | Attending: Physician Assistant | Admitting: Physician Assistant

## 2016-02-19 DIAGNOSIS — M2391 Unspecified internal derangement of right knee: Secondary | ICD-10-CM

## 2016-02-19 DIAGNOSIS — S83231A Complex tear of medial meniscus, current injury, right knee, initial encounter: Secondary | ICD-10-CM | POA: Diagnosis not present

## 2016-02-19 DIAGNOSIS — M948X6 Other specified disorders of cartilage, lower leg: Secondary | ICD-10-CM | POA: Insufficient documentation

## 2016-03-03 DIAGNOSIS — S83232A Complex tear of medial meniscus, current injury, left knee, initial encounter: Secondary | ICD-10-CM | POA: Insufficient documentation

## 2016-03-18 ENCOUNTER — Encounter
Admission: RE | Admit: 2016-03-18 | Discharge: 2016-03-18 | Disposition: A | Discharge status: Home / Self Care | Payer: Medicaid Other | Source: Ambulatory Visit | Attending: Surgery | Admitting: Surgery

## 2016-03-18 HISTORY — DX: Headache: R51

## 2016-03-18 HISTORY — DX: Benign neoplasm, unspecified site: D36.9

## 2016-03-18 HISTORY — DX: Dyspnea, unspecified: R06.00

## 2016-03-18 HISTORY — DX: Acute myocardial infarction, unspecified: I21.9

## 2016-03-18 HISTORY — DX: Headache, unspecified: R51.9

## 2016-03-18 HISTORY — DX: Unspecified asthma, uncomplicated: J45.909

## 2016-03-18 HISTORY — DX: Unspecified osteoarthritis, unspecified site: M19.90

## 2016-03-18 HISTORY — DX: Encounter for adoption services: Z02.82

## 2016-03-18 HISTORY — DX: Cardiac arrhythmia, unspecified: I49.9

## 2016-03-18 HISTORY — DX: Other specified health status: Z78.9

## 2016-03-18 NOTE — Patient Instructions (Signed)
  Your procedure is scheduled on: 04-01-16 [facility]) Report to [facility] 2nd floor medical mall [facility] Entrance-take elevator on left to 2nd floor.  Check in with surgery information desk.) To find out your arrival time please call [phone] between 1PM - 3PM on 03-31-16 [facility])  Remember: Instructions that are not followed completely may result in serious medical risk, up to and including death, or upon the discretion of your surgeon and anesthesiologist your surgery may need to be rescheduled.    _x___ 1. Do not eat food or drink liquids after midnight. No gum chewing or hard candies.     __x__ 2. No Alcohol for 24 hours before or after surgery.   __x__3. No Smoking for 24 prior to surgery.   ____  4. Bring all medications with you on the [place] of surgery if instructed.    __x__ 5. Notify your doctor if there is any change in your medical condition     (cold, fever, infections).     Do not wear jewelry, make-up, hairpins, clips or nail polish.  Do not wear lotions, powders, or perfumes. You may wear deodorant.  Do not shave 48 hours prior to surgery. Men may shave face and neck.  Do not bring valuables to the hospital.    [facility] is not responsible for any belongings or valuables.               Contacts, dentures or bridgework may not be worn into surgery.  Leave your suitcase in the car. After surgery it may be brought to your room.  For patients admitted to the hospital, discharge time is determined by your treatment team.   Patients discharged the [place] of surgery will not be allowed to drive home.  You will need someone to drive you home and stay with you the night of your procedure.    Please read over the following fact sheets that you were given:   [facility] Preparing for Surgery and or MRSA Information   _x___ Take these medicines the morning of surgery with A SIP OF WATER:    1. PROPRANOLOL  2.  3.  4.  5.  6.  ____Fleets enema or Magnesium  Citrate as directed.   _x___ Use CHG Soap or sage wipes as directed on instruction sheet   ____ Use inhalers on the [place] of surgery and bring to hospital [place] of surgery  ____ Stop metformin 2 days prior to surgery    ____ Take 1/2 of usual insulin dose the night before surgery and none on the morning of surgery.   ____ Stop Aspirin, Coumadin, Pllavix ,Eliquis, Effient, or Pradaxa  x__ Stop Anti-inflammatories such as Advil, Aleve, Ibuprofen, Motrin, Naproxen,          Naprosyn, GOODIES POWDERS or aspirin products 7 DAYS PRIOR TO SURGERY-Ok to take Tylenol OR TRAMADOL IF NEEDED   ____ Stop supplements until after surgery.    ____ Bring C-Pap to the hospital.

## 2016-03-22 ENCOUNTER — Encounter
Admission: RE | Admit: 2016-03-22 | Discharge: 2016-03-22 | Disposition: A | Discharge status: Home / Self Care | Payer: Medicaid Other | Source: Ambulatory Visit | Attending: Surgery | Admitting: Surgery

## 2016-03-22 DIAGNOSIS — I252 Old myocardial infarction: Secondary | ICD-10-CM | POA: Diagnosis not present

## 2016-03-22 DIAGNOSIS — I10 Essential (primary) hypertension: Secondary | ICD-10-CM | POA: Insufficient documentation

## 2016-03-22 DIAGNOSIS — I517 Cardiomegaly: Secondary | ICD-10-CM | POA: Diagnosis not present

## 2016-03-22 DIAGNOSIS — Z01812 Encounter for preprocedural laboratory examination: Secondary | ICD-10-CM | POA: Insufficient documentation

## 2016-03-22 DIAGNOSIS — Z01810 Encounter for preprocedural cardiovascular examination: Secondary | ICD-10-CM | POA: Diagnosis present

## 2016-03-22 DIAGNOSIS — M170 Bilateral primary osteoarthritis of knee: Secondary | ICD-10-CM | POA: Insufficient documentation

## 2016-03-22 LAB — CBC
HCT: 41.3 % (ref 35.0–47.0)
Hemoglobin: 13.3 g/dL (ref 12.0–16.0)
MCH: 25.1 pg — ABNORMAL LOW (ref 26.0–34.0)
MCHC: 32.3 g/dL (ref 32.0–36.0)
MCV: 77.6 fL — AB (ref 80.0–100.0)
PLATELETS: 346 10*3/uL (ref 150–440)
RBC: 5.32 MIL/uL — AB (ref 3.80–5.20)
RDW: 16.9 % — AB (ref 11.5–14.5)
WBC: 8.8 10*3/uL (ref 3.6–11.0)

## 2016-03-25 ENCOUNTER — Ambulatory Visit: Payer: Medicaid Other | Attending: Nurse Practitioner

## 2016-03-31 MED ORDER — CEFAZOLIN SODIUM-DEXTROSE 2-4 GM/100ML-% IV SOLN
2.0000 g | Freq: Once | INTRAVENOUS | Status: AC
  Administered 2016-04-01: 2 g via INTRAVENOUS

## 2016-04-01 ENCOUNTER — Ambulatory Visit: Payer: Medicaid Other | Admitting: Anesthesiology

## 2016-04-01 ENCOUNTER — Encounter
Admission: RE | Disposition: A | Discharge status: Home / Self Care | Payer: Self-pay | Source: Ambulatory Visit | Attending: Surgery

## 2016-04-01 ENCOUNTER — Encounter: Payer: Self-pay | Admitting: *Deleted

## 2016-04-01 ENCOUNTER — Ambulatory Visit
Admission: RE | Admit: 2016-04-01 | Discharge: 2016-04-01 | Disposition: A | Discharge status: Home / Self Care | Payer: Medicaid Other | Source: Ambulatory Visit | Attending: Surgery | Admitting: Surgery

## 2016-04-01 DIAGNOSIS — M1711 Unilateral primary osteoarthritis, right knee: Secondary | ICD-10-CM | POA: Insufficient documentation

## 2016-04-01 DIAGNOSIS — M23203 Derangement of unspecified medial meniscus due to old tear or injury, right knee: Secondary | ICD-10-CM | POA: Diagnosis not present

## 2016-04-01 DIAGNOSIS — M25561 Pain in right knee: Secondary | ICD-10-CM | POA: Diagnosis present

## 2016-04-01 DIAGNOSIS — I10 Essential (primary) hypertension: Secondary | ICD-10-CM | POA: Diagnosis not present

## 2016-04-01 HISTORY — PX: CHONDROPLASTY: SHX5177

## 2016-04-01 HISTORY — PX: KNEE ARTHROSCOPY WITH MENISCAL REPAIR: SHX5653

## 2016-04-01 LAB — POCT PREGNANCY, URINE: Preg Test, Ur: NEGATIVE

## 2016-04-01 SURGERY — ARTHROSCOPY, KNEE, WITH MENISCUS REPAIR
Anesthesia: General | Site: Knee | Laterality: Right | Wound class: Clean

## 2016-04-01 MED ORDER — PROMETHAZINE HCL 25 MG/ML IJ SOLN: 6.2500 mg | INTRAMUSCULAR | Status: DC | PRN

## 2016-04-01 MED ORDER — ROCURONIUM BROMIDE 100 MG/10ML IV SOLN
INTRAVENOUS | Status: DC | PRN
  Administered 2016-04-01: 45 mg via INTRAVENOUS

## 2016-04-01 MED ORDER — ROCURONIUM BROMIDE 50 MG/5ML IV SOSY
PREFILLED_SYRINGE | INTRAVENOUS | Status: AC
  Filled 2016-04-01: qty 5

## 2016-04-01 MED ORDER — FAMOTIDINE 20 MG PO TABS
ORAL_TABLET | ORAL | Status: AC
  Administered 2016-04-01: 20 mg via ORAL
  Filled 2016-04-01: qty 1

## 2016-04-01 MED ORDER — KETOROLAC TROMETHAMINE 30 MG/ML IJ SOLN
INTRAMUSCULAR | Status: AC
  Filled 2016-04-01: qty 1

## 2016-04-01 MED ORDER — LIDOCAINE HCL (PF) 2 % IJ SOLN
INTRAMUSCULAR | Status: AC
  Filled 2016-04-01: qty 2

## 2016-04-01 MED ORDER — DEXAMETHASONE SODIUM PHOSPHATE 10 MG/ML IJ SOLN
INTRAMUSCULAR | Status: AC
  Filled 2016-04-01: qty 1

## 2016-04-01 MED ORDER — CEFAZOLIN SODIUM-DEXTROSE 2-4 GM/100ML-% IV SOLN
INTRAVENOUS | Status: AC
  Filled 2016-04-01: qty 100

## 2016-04-01 MED ORDER — FENTANYL CITRATE (PF) 250 MCG/5ML IJ SOLN
INTRAMUSCULAR | Status: AC
  Filled 2016-04-01: qty 5

## 2016-04-01 MED ORDER — GLYCOPYRROLATE 0.2 MG/ML IJ SOLN
INTRAMUSCULAR | Status: AC
  Filled 2016-04-01: qty 1

## 2016-04-01 MED ORDER — LIDOCAINE HCL (PF) 1 % IJ SOLN
INTRAMUSCULAR | Status: AC
  Filled 2016-04-01: qty 30

## 2016-04-01 MED ORDER — BUPIVACAINE-EPINEPHRINE 0.25% -1:200000 IJ SOLN
INTRAMUSCULAR | Status: DC | PRN
  Administered 2016-04-01: 20 mL
  Administered 2016-04-01: 30 mL

## 2016-04-01 MED ORDER — ONDANSETRON HCL 4 MG/2ML IJ SOLN
INTRAMUSCULAR | Status: DC | PRN
  Administered 2016-04-01: 4 mg via INTRAVENOUS

## 2016-04-01 MED ORDER — MIDAZOLAM HCL 2 MG/2ML IJ SOLN
INTRAMUSCULAR | Status: AC
  Filled 2016-04-01: qty 2

## 2016-04-01 MED ORDER — METOCLOPRAMIDE HCL 10 MG PO TABS: 5.0000 mg | ORAL_TABLET | Freq: Three times a day (TID) | ORAL | Status: DC | PRN

## 2016-04-01 MED ORDER — FENTANYL CITRATE (PF) 100 MCG/2ML IJ SOLN
INTRAMUSCULAR | Status: AC
  Administered 2016-04-01: 25 ug via INTRAVENOUS
  Filled 2016-04-01: qty 2

## 2016-04-01 MED ORDER — HYDROCODONE-ACETAMINOPHEN 5-325 MG PO TABS
1.0000 | ORAL_TABLET | Freq: Four times a day (QID) | ORAL | 0 refills | Status: DC | PRN
Start: 2016-04-01 — End: 2017-04-18

## 2016-04-01 MED ORDER — FAMOTIDINE 20 MG PO TABS
20.0000 mg | ORAL_TABLET | Freq: Once | ORAL | Status: AC
Start: 2016-04-01 — End: 2016-04-01
  Administered 2016-04-01: 20 mg via ORAL

## 2016-04-01 MED ORDER — BUPIVACAINE-EPINEPHRINE (PF) 0.25% -1:200000 IJ SOLN
INTRAMUSCULAR | Status: AC
  Filled 2016-04-01: qty 60

## 2016-04-01 MED ORDER — FENTANYL CITRATE (PF) 100 MCG/2ML IJ SOLN
INTRAMUSCULAR | Status: DC | PRN
  Administered 2016-04-01: 250 ug via INTRAVENOUS

## 2016-04-01 MED ORDER — HYDROCODONE-ACETAMINOPHEN 5-325 MG PO TABS
1.0000 | ORAL_TABLET | ORAL | Status: DC | PRN
  Administered 2016-04-01: 2 via ORAL

## 2016-04-01 MED ORDER — KETOROLAC TROMETHAMINE 30 MG/ML IJ SOLN
INTRAMUSCULAR | Status: DC | PRN
  Administered 2016-04-01: 30 mg via INTRAVENOUS

## 2016-04-01 MED ORDER — MIDAZOLAM HCL 2 MG/2ML IJ SOLN
INTRAMUSCULAR | Status: DC | PRN
  Administered 2016-04-01: 2 mg via INTRAVENOUS

## 2016-04-01 MED ORDER — ONDANSETRON HCL 4 MG/2ML IJ SOLN
INTRAMUSCULAR | Status: AC
  Filled 2016-04-01: qty 2

## 2016-04-01 MED ORDER — LIDOCAINE HCL (CARDIAC) 20 MG/ML IV SOLN
INTRAVENOUS | Status: DC | PRN
  Administered 2016-04-01: 100 mg via INTRAVENOUS

## 2016-04-01 MED ORDER — PROPOFOL 10 MG/ML IV BOLUS
INTRAVENOUS | Status: AC
  Filled 2016-04-01: qty 20

## 2016-04-01 MED ORDER — PROPOFOL 10 MG/ML IV BOLUS
INTRAVENOUS | Status: DC | PRN
  Administered 2016-04-01: 180 mg via INTRAVENOUS

## 2016-04-01 MED ORDER — ONDANSETRON HCL 4 MG PO TABS: 4.0000 mg | ORAL_TABLET | Freq: Four times a day (QID) | ORAL | Status: DC | PRN

## 2016-04-01 MED ORDER — FENTANYL CITRATE (PF) 100 MCG/2ML IJ SOLN
25.0000 ug | INTRAMUSCULAR | Status: DC | PRN
  Administered 2016-04-01 (×3): 25 ug via INTRAVENOUS

## 2016-04-01 MED ORDER — LIDOCAINE HCL 1 % IJ SOLN
INTRAMUSCULAR | Status: DC | PRN
  Administered 2016-04-01: 30 mL

## 2016-04-01 MED ORDER — SUGAMMADEX SODIUM 500 MG/5ML IV SOLN
INTRAVENOUS | Status: AC
  Filled 2016-04-01: qty 5

## 2016-04-01 MED ORDER — SUGAMMADEX SODIUM 200 MG/2ML IV SOLN
INTRAVENOUS | Status: DC | PRN
  Administered 2016-04-01: 500 mg via INTRAVENOUS

## 2016-04-01 MED ORDER — HYDROCODONE-ACETAMINOPHEN 5-325 MG PO TABS
ORAL_TABLET | ORAL | Status: AC
  Filled 2016-04-01: qty 2

## 2016-04-01 MED ORDER — LACTATED RINGERS IV SOLN
INTRAVENOUS | Status: DC
  Administered 2016-04-01: 07:00:00 via INTRAVENOUS

## 2016-04-01 MED ORDER — DEXAMETHASONE SODIUM PHOSPHATE 4 MG/ML IJ SOLN
INTRAMUSCULAR | Status: DC | PRN
  Administered 2016-04-01: 5 mg via INTRAVENOUS

## 2016-04-01 MED ORDER — METOCLOPRAMIDE HCL 5 MG/ML IJ SOLN: 5.0000 mg | Freq: Three times a day (TID) | INTRAMUSCULAR | Status: DC | PRN

## 2016-04-01 MED ORDER — ONDANSETRON HCL 4 MG/2ML IJ SOLN: 4.0000 mg | Freq: Four times a day (QID) | INTRAMUSCULAR | Status: DC | PRN

## 2016-04-01 MED ORDER — POTASSIUM CHLORIDE IN NACL 20-0.9 MEQ/L-% IV SOLN: INTRAVENOUS | Status: DC

## 2016-04-01 SURGICAL SUPPLY — 35 items
BAG COUNTER SPONGE EZ (MISCELLANEOUS) IMPLANT
BAG SPNG 4X4 CLR HAZ (MISCELLANEOUS)
BANDAGE ACE 6X5 VEL STRL LF (GAUZE/BANDAGES/DRESSINGS) ×2 IMPLANT
BLADE FULL RADIUS 3.5 (BLADE) ×2 IMPLANT
BLADE SHAVER 4.5X7 STR FR (MISCELLANEOUS) ×2 IMPLANT
CHLORAPREP W/TINT 26ML (MISCELLANEOUS) ×2 IMPLANT
CUFF TOURN 24 STER (MISCELLANEOUS) IMPLANT
CUFF TOURN 30 STER DUAL PORT (MISCELLANEOUS) IMPLANT
CUFF TOURN 34 STER (MISCELLANEOUS) ×1 IMPLANT
DRAPE IMP U-DRAPE 54X76 (DRAPES) ×2 IMPLANT
ELECT REM PT RETURN 9FT ADLT (ELECTROSURGICAL) ×2
ELECTRODE REM PT RTRN 9FT ADLT (ELECTROSURGICAL) ×1 IMPLANT
GAUZE SPONGE 4X4 12PLY STRL (GAUZE/BANDAGES/DRESSINGS) ×2 IMPLANT
GLOVE BIO SURGEON STRL SZ8 (GLOVE) ×4 IMPLANT
GLOVE BIOGEL M 7.0 STRL (GLOVE) ×4 IMPLANT
GLOVE BIOGEL PI IND STRL 7.5 (GLOVE) ×1 IMPLANT
GLOVE BIOGEL PI INDICATOR 7.5 (GLOVE) ×1
GLOVE INDICATOR 8.0 STRL GRN (GLOVE) ×2 IMPLANT
GOWN STRL REUS W/ TWL LRG LVL3 (GOWN DISPOSABLE) ×1 IMPLANT
GOWN STRL REUS W/ TWL XL LVL3 (GOWN DISPOSABLE) ×2 IMPLANT
GOWN STRL REUS W/TWL LRG LVL3 (GOWN DISPOSABLE) ×2
GOWN STRL REUS W/TWL XL LVL3 (GOWN DISPOSABLE) ×4
IV LACTATED RINGER IRRG 3000ML (IV SOLUTION) ×2
IV LR IRRIG 3000ML ARTHROMATIC (IV SOLUTION) ×1 IMPLANT
KIT RM TURNOVER STRD PROC AR (KITS) ×2 IMPLANT
MANIFOLD NEPTUNE II (INSTRUMENTS) ×2 IMPLANT
NDL HYPO 21X1.5 SAFETY (NEEDLE) ×1 IMPLANT
NEEDLE HYPO 21X1.5 SAFETY (NEEDLE) ×2 IMPLANT
PACK ARTHROSCOPY KNEE (MISCELLANEOUS) ×2 IMPLANT
PENCIL ELECTRO HAND CTR (MISCELLANEOUS) ×2 IMPLANT
SUT PROLENE 4 0 PS 2 18 (SUTURE) ×2 IMPLANT
SUT TICRON COATED BLUE 2 0 30 (SUTURE) IMPLANT
SYR 50ML LL SCALE MARK (SYRINGE) ×2 IMPLANT
TUBING ARTHRO INFLOW-ONLY STRL (TUBING) ×2 IMPLANT
WAND HAND CNTRL MULTIVAC 90 (MISCELLANEOUS) ×2 IMPLANT

## 2016-04-01 NOTE — Discharge Instructions (Addendum)
Keep dressing dry and intact.  May shower after dressing changed on post-op Podesta #4 (Monday).  Cover sutures with Band-Aids after drying off. Apply ice frequently to knee. Take ibuprofen 800 mg TID with meals for 7-10 days, then as necessary. Take pain medication as prescribed or ES Tylenol when needed.  May weight-bear as tolerated - use crutches or walker as needed. Follow-up in 10-14 days or as scheduled.  AMBULATORY SURGERY  DISCHARGE INSTRUCTIONS   1) The drugs that you were given will stay in your system until tomorrow so for the next 24 hours you should not:  A) Drive an automobile B) Make any legal decisions C) Drink any alcoholic beverage   2) You may resume regular meals tomorrow.  Today it is better to start with liquids and gradually work up to solid foods.  You may eat anything you prefer, but it is better to start with liquids, then soup and crackers, and gradually work up to solid foods.   3) Please notify your doctor immediately if you have any unusual bleeding, trouble breathing, redness and pain at the surgery site, drainage, fever, or pain not relieved by medication.    4) Additional Instructions:    Please contact your physician with any problems or [facility] at [phone], Monday through Friday 6 am to 4 pm, or [place] [facility] number at [phone].

## 2016-04-01 NOTE — Anesthesia Procedure Notes (Signed)
Procedure Name: Intubation Date/Time: 04/01/2016 7:45 AM Performed by: Rosaria Ferries, Kyheem Bathgate Pre-anesthesia Checklist: Patient identified, Emergency Drugs available, Suction available and Patient being monitored Patient Re-evaluated:Patient Re-evaluated prior to inductionOxygen Delivery Method: Circle system utilized Preoxygenation: Pre-oxygenation with 100% oxygen Intubation Type: IV induction Laryngoscope Size: Mac and 3 Grade View: Grade I Tube type: Oral Tube size: 7.0 mm Number of attempts: 1 Placement Confirmation: ETT inserted through vocal cords under direct vision,  positive ETCO2 and breath sounds checked- equal and bilateral Secured at: 22 cm Tube secured with: Tape Dental Injury: Teeth and Oropharynx as per pre-operative assessment  Comments: Lots of thyroid pressure down and to right to optimize view

## 2016-04-01 NOTE — Anesthesia Preprocedure Evaluation (Signed)
Anesthesia Evaluation  Patient identified by MRN, date of birth, ID band Patient awake    Reviewed: Allergy & Precautions, H&P , NPO status , Patient's Chart, lab work & pertinent test results, reviewed documented beta blocker date and time   History of Anesthesia Complications Negative for: history of anesthetic complications  Airway Mallampati: IV  TM Distance: >3 FB Neck ROM: full    Dental no notable dental hx. (+) Teeth Intact, Dental Advidsory Given   Pulmonary neg shortness of breath, asthma , neg sleep apnea, neg recent URI,           Cardiovascular Exercise Tolerance: Good hypertension, (-) angina+ Past MI  (-) CAD, (-) Cardiac Stents and (-) CABG + dysrhythmias (-) Valvular Problems/Murmurs     Neuro/Psych negative neurological ROS  negative psych ROS   GI/Hepatic negative GI ROS, Neg liver ROS,   Endo/Other  negative endocrine ROS  Renal/GU negative Renal ROS  negative genitourinary   Musculoskeletal   Abdominal   Peds  Hematology negative hematology ROS (+)   Anesthesia Other Findings Past Medical History: No date: Adopted No date: Arthritis     Comment: BOTH KNEES No date: Asthma No date: Benign tumor No date: Dyspnea     Comment: VERY RARE No date: Dysrhythmia     Comment: IRREGULAR PER PT No date: Headache No date: Hypertension No date: Myocardial infarction     Comment: AGE 46   Reproductive/Obstetrics negative OB ROS                             Anesthesia Physical Anesthesia Plan  ASA: II  Anesthesia Plan: General   Post-op Pain Management:    Induction:   Airway Management Planned:   Additional Equipment:   Intra-op Plan:   Post-operative Plan:   Informed Consent: I have reviewed the patients History and Physical, chart, labs and discussed the procedure including the risks, benefits and alternatives for the proposed anesthesia with the patient or  authorized representative who has indicated his/her understanding and acceptance.   Dental Advisory Given  Plan Discussed with: Anesthesiologist, CRNA and Surgeon  Anesthesia Plan Comments:         Anesthesia Quick Evaluation

## 2016-04-01 NOTE — Anesthesia Post-op Follow-up Note (Cosign Needed)
Anesthesia QCDR form completed.        

## 2016-04-01 NOTE — Transfer of Care (Signed)
Immediate Anesthesia Transfer of Care Note  Patient: Destiny Singh  Procedure(s) Performed: Procedure(s): KNEE ARTHROSCOPY WITH PARTIAL MEDIAL  MENISCAL REPAIR (Right) CHONDROPLASTY -ABRASION OF FEMORAL TROCHLEA, LATERAL TIBIA Plateau (Right)  Patient Location: PACU  Anesthesia Type:General  Level of Consciousness: awake, alert , oriented and patient cooperative  Airway & Oxygen Therapy: Patient Spontanous Breathing and Patient connected to face mask oxygen  Post-op Assessment: Report given to RN  Post vital signs: Reviewed and stable  Last Vitals:  Vitals:   04/01/16 0601  BP: (!) 166/99  Pulse: 88  Resp: 18  Temp: 36.5 C    Last Pain:  Vitals:   04/01/16 0601  TempSrc: Oral  PainSc: 10-Worst pain ever         Complications: No apparent anesthesia complications

## 2016-04-01 NOTE — H&P (Signed)
Paper H&P to be scanned into permanent record. H&P reviewed. No changes. 

## 2016-04-01 NOTE — Op Note (Signed)
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  Postoperative diagnosis:   Same.  Procedure:   Arthroscopic partial medial meniscectomy with abrasion chondroplasty of grade 3 chondromalacial changes involving the medial femoral condyle, lateral tibial plateau, the femoral trochlea, right knee.  Surgeon:   Pascal Lux, M.D.  Anesthesia:   GET  Findings:   As above. The medial meniscus tear involving the posterior medial corner and included a degenerative flap tear with extension back nearly to the meniscocapsular junction. The lateral meniscus was in satisfactory condition as were the anterior and posterior cruciate ligaments. There also were grade 2 chondral malacia changes involving the central ridge of the patella and grade 2 chondral malacia changes involving the medial tibial plateau.  Complications:   None.  EBL:   2 cc.  Total fluids:   800 cc of crystalloid.  Tourniquet time:   None  Drains:   None  Closure:   4-0 Prolene interrupted sutures.  Brief clinical note:   The patient is a 46 year old female with a history of medial sided right knee pain. Her symptoms have persisted despite medications, activity modification, etc. Her history and examination are consistent with degenerative joint disease with a medial meniscus tear, both of which were confirmed by MRI scan. The patient presents at this time for arthroscopy, debridement, and partial medial meniscectomy.  Procedure:   The patient was brought into the operating room and lain in the supine position. After adequate general endotracheal intubation and anesthesia was obtained, a timeout was performed to verify the appropriate side. The patient's right knee was injected sterilely using a solution of 30 cc of 1% lidocaine and 30 cc of 0.25% Sensorcaine with epinephrine. The right lower extremity was prepped with ChloraPrep  solution before being draped sterilely. Preoperative antibiotics were administered. The expected portal sites were injected with 0.5% Sensorcaine with epinephrine before the camera was placed in the anterolateral portal and instrumentation performed through the anteromedial portal. The knee was sequentially examined beginning in the suprapatellar pouch, then progressing to the patellofemoral space, the medial gutter compartment, the notch, and finally the lateral compartment and gutter. The findings were as described above. Abundant reactive synovial tissues anteriorly were debrided using the full-radius resector in order to improve visualization. The degenerative tear of the medial meniscus was debrided back to stable margins using the straight and side-biting mini-munchers, as well as the full-radius resector. Subsequent probing of the remaining rim demonstrated good stability. The areas of loose articular cartilage involving the lateral tibial plateau, the medial femoral condyle, and the femoral trochlea all were debrided back to stable margins using the full-radius resector as well. The instruments were removed from the joint after suctioning the excess fluid. The portal sites were closed using 4-0 Prolene interrupted sutures before a sterile bulky dressing was applied to the knee. The patient was then awakened, extubated, and returned to the recovery room in satisfactory condition after tolerating the procedure well.

## 2016-04-02 NOTE — Anesthesia Postprocedure Evaluation (Signed)
Anesthesia Post Note  Patient: Destiny Singh  Procedure(s) Performed: Procedure(s) (LRB): KNEE ARTHROSCOPY WITH PARTIAL MEDIAL  MENISCAL REPAIR (Right) CHONDROPLASTY -ABRASION OF FEMORAL TROCHLEA, LATERAL TIBIA Plateau (Right)  Patient location during evaluation: PACU Anesthesia Type: General Level of consciousness: awake and alert Pain management: pain level controlled Vital Signs Assessment: post-procedure vital signs reviewed and stable Respiratory status: spontaneous breathing, nonlabored ventilation, respiratory function stable and patient connected to nasal cannula oxygen Cardiovascular status: blood pressure returned to baseline and stable Postop Assessment: no signs of nausea or vomiting Anesthetic complications: no     Last Vitals:  Vitals:   04/01/16 1012 04/01/16 1132  BP: (!) 144/89 (!) 142/96  Pulse: 68 80  Resp: 16 16  Temp: 36.7 C     Last Pain:  Vitals:   04/02/16 0838  TempSrc:   PainSc: 10-Worst pain ever                 Martha Clan

## 2016-05-24 DIAGNOSIS — M25461 Effusion, right knee: Secondary | ICD-10-CM | POA: Insufficient documentation

## 2016-09-27 ENCOUNTER — Emergency Department
Admission: EM | Admit: 2016-09-27 | Discharge: 2016-09-27 | Disposition: A | Discharge status: Home / Self Care | Payer: Medicaid Other | Attending: Emergency Medicine | Admitting: Emergency Medicine

## 2016-09-27 ENCOUNTER — Encounter: Payer: Self-pay | Admitting: Emergency Medicine

## 2016-09-27 DIAGNOSIS — R778 Other specified abnormalities of plasma proteins: Secondary | ICD-10-CM

## 2016-09-27 DIAGNOSIS — Z8673 Personal history of transient ischemic attack (TIA), and cerebral infarction without residual deficits: Secondary | ICD-10-CM | POA: Insufficient documentation

## 2016-09-27 DIAGNOSIS — J45909 Unspecified asthma, uncomplicated: Secondary | ICD-10-CM | POA: Insufficient documentation

## 2016-09-27 DIAGNOSIS — I10 Essential (primary) hypertension: Secondary | ICD-10-CM | POA: Insufficient documentation

## 2016-09-27 DIAGNOSIS — I471 Supraventricular tachycardia: Secondary | ICD-10-CM | POA: Insufficient documentation

## 2016-09-27 DIAGNOSIS — Z79899 Other long term (current) drug therapy: Secondary | ICD-10-CM | POA: Insufficient documentation

## 2016-09-27 DIAGNOSIS — R7989 Other specified abnormal findings of blood chemistry: Secondary | ICD-10-CM

## 2016-09-27 LAB — BASIC METABOLIC PANEL
ANION GAP: 11 (ref 5–15)
BUN: 20 mg/dL (ref 6–20)
CALCIUM: 8.9 mg/dL (ref 8.9–10.3)
CO2: 21 mmol/L — AB (ref 22–32)
Chloride: 107 mmol/L (ref 101–111)
Creatinine, Ser: 1.23 mg/dL — ABNORMAL HIGH (ref 0.44–1.00)
GFR calc Af Amer: 60 mL/min — ABNORMAL LOW (ref >60)
GFR calc non Af Amer: 51 mL/min — ABNORMAL LOW (ref >60)
GLUCOSE: 137 mg/dL — AB (ref 65–99)
Potassium: 3.9 mmol/L (ref 3.5–5.1)
Sodium: 139 mmol/L (ref 135–145)

## 2016-09-27 LAB — T4, FREE: Free T4: 1.04 ng/dL (ref 0.61–1.12)

## 2016-09-27 LAB — CBC
HEMATOCRIT: 42.7 % (ref 35.0–47.0)
Hemoglobin: 13.8 g/dL (ref 12.0–16.0)
MCH: 24.9 pg — AB (ref 26.0–34.0)
MCHC: 32.3 g/dL (ref 32.0–36.0)
MCV: 77.1 fL — AB (ref 80.0–100.0)
Platelets: 415 10*3/uL (ref 150–440)
RBC: 5.54 MIL/uL — ABNORMAL HIGH (ref 3.80–5.20)
RDW: 17.2 % — AB (ref 11.5–14.5)
WBC: 12.4 10*3/uL — AB (ref 3.6–11.0)

## 2016-09-27 LAB — TSH: TSH: 1.237 u[IU]/mL (ref 0.350–4.500)

## 2016-09-27 LAB — TROPONIN I
Troponin I: <0.03 ng/mL (ref <0.03)
Troponin I: 0.04 ng/mL (ref <0.03)

## 2016-09-27 LAB — MAGNESIUM: Magnesium: 1.8 mg/dL (ref 1.7–2.4)

## 2016-09-27 MED ORDER — ASPIRIN 81 MG PO CHEW
324.0000 mg | CHEWABLE_TABLET | Freq: Once | ORAL | Status: AC
Start: 2016-09-27 — End: 2016-09-27
  Administered 2016-09-27: 324 mg via ORAL
  Filled 2016-09-27: qty 4

## 2016-09-27 MED ORDER — SODIUM CHLORIDE 0.9 % IV BOLUS (SEPSIS)
1000.0000 mL | Freq: Once | INTRAVENOUS | Status: AC
  Administered 2016-09-27: 1000 mL via INTRAVENOUS

## 2016-09-27 MED ORDER — ADENOSINE 6 MG/2ML IV SOLN
INTRAVENOUS | Status: AC
  Filled 2016-09-27: qty 2

## 2016-09-27 MED ORDER — ADENOSINE 6 MG/2ML IV SOLN
6.0000 mg | Freq: Once | INTRAVENOUS | Status: AC
  Administered 2016-09-27: 6 mg via INTRAVENOUS

## 2016-09-27 MED ORDER — ADENOSINE 6 MG/2ML IV SOLN
INTRAVENOUS | Status: AC
  Administered 2016-09-27: 6 mg via INTRAVENOUS
  Filled 2016-09-27: qty 2

## 2016-09-27 MED ORDER — ADENOSINE 12 MG/4ML IV SOLN
INTRAVENOUS | Status: AC
  Filled 2016-09-27: qty 4

## 2016-09-27 NOTE — ED Provider Notes (Signed)
Clinical Course as of Sep 27 2316  Mon Sep 27, 2016  2011 Assuming care from Dr. Alfred Levins.  In short, Destiny Singh is a 46 y.o. female with a chief complaint of SVT and chest pain.  Refer to the original H&P for additional details.  The current plan of care is to follow up second troponin and discharge if appropriate.   [CF]  2014 Patient remains in normal sinus rhythm with no chest pain. Second troponin is due at 8:30. Care transferred to Dr. Karma Greaser with plan to DC home with f/u with Cardiology if 2nd trop is negative,  [CV]  2240 The patient's second troponin is 0.04 which is to be expected after a lengthy episode of SVT.  I did call and speak with Dr. Fletcher Anon with cardiology who agreed that this is not concerning as long as the patient is asymptomatic.  I double checked with her and she has been asymptomatic since receiving the adenosine.  I gave her my usual and customary recommendations and return precautions and she will call the office of Dr. Fletcher Anon to schedule a follow-up appointment.  She and her family understand and agree with the plan. Troponin I: (!!) 0.04 [CF]    Clinical Course User Index [CF] Hinda Kehr, MD [CV] Rudene Re, MD      Hinda Kehr, MD 09/27/16 [phone]

## 2016-09-27 NOTE — ED Triage Notes (Signed)
Pt presents to ED c/o fast heart rate, and shortness of breath

## 2016-09-27 NOTE — ED Provider Notes (Signed)
[facility] Emergency Department Provider Note  ____________________________________________  Time seen: Approximately 6:32 PM  I have reviewed the triage vital signs and the nursing notes.   HISTORY  Chief Complaint No chief complaint on file.   HPI Destiny Singh is a 46 y.o. female with a history of coronary artery disease, hypertension, irregular heartbeat on propranolol who presents for evaluation of chest pain. Patient reports that she was post work overnight and was taking a nap. When she woke up she noticed palpitations and started having chest pain that she describes as a heaviness located in the center of her chest, constant for 1 hour, nonradiating, associated with shortness of breath and dizziness. Patient has had a prior episode which was similar but she does not remember what the diagnosis was or what medication she received. Patient is not a smoker. She denies drug use or alcohol. She denies caffeine intake. She denies history of thyroid disease. She denies personal or family history blood clots, recent travel immobilization, leg pain or swelling, hemoptysis, exogenous hormones. Patient is status post tubal ligation.  Past Medical History:  Diagnosis Date  . Adopted   . Arthritis    BOTH KNEES  . Asthma   . Benign tumor   . Dyspnea    VERY RARE  . Dysrhythmia    IRREGULAR PER PT  . Headache   . Hypertension   . Myocardial infarction ([place])    AGE 29    There are no active problems to display for this patient.   Past Surgical History:  Procedure Laterality Date  . BREAST SURGERY Bilateral    BENIGN TUMORS  . CHONDROPLASTY Right 04/01/2016   Procedure: CHONDROPLASTY -ABRASION OF FEMORAL TROCHLEA, LATERAL TIBIA Plateau;  Surgeon: Corky Mull, MD;  Location: [facility] ORS;  Service: Orthopedics;  Laterality: Right;  . KNEE ARTHROSCOPY WITH MENISCAL REPAIR Right 04/01/2016   Procedure: KNEE ARTHROSCOPY WITH PARTIAL MEDIAL  MENISCAL REPAIR;   Surgeon: Corky Mull, MD;  Location: [facility] ORS;  Service: Orthopedics;  Laterality: Right;  . TUBAL LIGATION    . TUMOR EXCISION     BENIGN-HEAD    Prior to Admission medications   Medication Sig Start Date End Date Taking? Authorizing Provider  Aspirin-Acetaminophen-Caffeine (GOODY HEADACHE PO) Take 1 packet by mouth daily as needed (headaches).    [provider]  ciclopirox (LOPROX) 0.77 % cream Apply 1 application topically 2 (two) times daily.  01/05/16   [provider]  cyclobenzaprine (FLEXERIL) 10 MG tablet Take 1 tablet (10 mg total) by mouth 3 (three) times daily as needed. Patient taking differently: Take 10 mg by mouth 3 (three) times daily as needed for muscle spasms.  01/13/16   Sable Feil, PA-C  doxycycline (VIBRA-TABS) 100 MG tablet Take 1 tablet by mouth daily.  10/Destiny/17   [provider]  HYDROcodone-acetaminophen (NORCO) 5-325 MG tablet Take 1-2 tablets by mouth every 6 (six) hours as needed for moderate pain. MAXIMUM TOTAL ACETAMINOPHEN DOSE IS 4000 MG PER [place] 04/01/16   Poggi, Marshall Cork, MD  ibuprofen (ADVIL,MOTRIN) 800 MG tablet Take 1 tablet (800 mg total) by mouth every 8 (eight) hours as needed for moderate pain. 01/13/16   Sable Feil, PA-C  propranolol ER (INDERAL LA) 60 MG 24 hr capsule Take 60 mg by mouth every morning.  10/Destiny/17   [provider]    Allergies Bee venom and Other  Family History  Problem Relation Age of Onset  . Adopted:  Yes    Social History Social History  Substance Use Topics  . Smoking status: Never Smoker  . Smokeless tobacco: Never Used  . Alcohol use No    Review of Systems  Constitutional: Negative for fever. + Lightheadedness Eyes: Negative for visual changes. ENT: Negative for sore throat. Neck: No neck pain  Cardiovascular: + chest pain. Respiratory: + shortness of breath. Gastrointestinal: Negative for abdominal pain, vomiting or diarrhea. Genitourinary: Negative for  dysuria. Musculoskeletal: Negative for back pain. Skin: Negative for rash. Neurological: Negative for headaches, weakness or numbness. Psych: No SI or HI  ____________________________________________   PHYSICAL EXAM:  VITAL SIGNS: ED Triage Vitals  Enc Vitals Group     BP 09/27/16 1720 (!) 154/100     Pulse Rate 09/27/16 1720 (!) 175     Resp 09/27/16 1720 (!) 25     Temp 09/27/16 1731 98.4 F (36.9 C)     Temp src --      SpO2 09/27/16 1720 100 %     Weight 09/27/16 1732 220 lb (99.8 kg)     Height 09/27/16 1732 5\' 9"  (1.753 m)     Head Circumference --      Peak Flow --      Pain Score --      Pain Loc --      Pain Edu? --      Excl. in [place]? --     Constitutional: Alert and oriented, in distress due to chest pain.  HEENT:      Head: Normocephalic and atraumatic.         Eyes: Conjunctivae are normal. Sclera is non-icteric.       Mouth/Throat: Mucous membranes are moist.       Neck: Supple with no signs of meningismus. Cardiovascular: Regular rhythm with tachycardic rate. No murmurs, gallops, or rubs. 2+ symmetrical distal pulses are present in all extremities. No JVD. Respiratory: Normal respiratory effort. Lungs are clear to auscultation bilaterally. No wheezes, crackles, or rhonchi.  Gastrointestinal: Soft, non tender, and non distended with positive bowel sounds. No rebound or guarding. Musculoskeletal: Nontender with normal range of motion in all extremities. No edema, cyanosis, or erythema of extremities. Neurologic: Normal speech and language. Face is symmetric. Moving all extremities. No gross focal neurologic deficits are appreciated. Skin: Skin is warm, dry and intact. No rash noted. Psychiatric: Mood and affect are normal. Speech and behavior are normal.  ____________________________________________   LABS (all labs ordered are listed, but only abnormal results are displayed)  Labs Reviewed  CBC - Abnormal; Notable for the following:       Result Value    WBC 12.4 (*)    RBC 5.54 (*)    MCV 77.1 (*)    MCH 24.9 (*)    RDW 17.2 (*)    All other components within normal limits  BASIC METABOLIC PANEL - Abnormal; Notable for the following:    CO2 21 (*)    Glucose, Bld 137 (*)    Creatinine, Ser 1.23 (*)    GFR calc non Af Amer 51 (*)    GFR calc Af Amer 60 (*)    All other components within normal limits  TSH  T4, FREE  TROPONIN I  MAGNESIUM  TROPONIN I   ____________________________________________  EKG   ED ECG REPORT I, Rudene Re, the attending physician, personally viewed and interpreted this ECG.  17:11 - SVT, rate of 190, normal QTC, normal axis, slight ST depressions in inferolateral leads, no  ST elevation.  17:22 - sinus tachycardia, rate of 111, normal intervals, normal axis, occasional PVCs, no ST elevations or depressions. ____________________________________________  RADIOLOGY  none  ____________________________________________   PROCEDURES  Procedure(s) performed: None Procedures Critical Care performed:  None ____________________________________________   INITIAL IMPRESSION / ASSESSMENT AND PLAN / ED COURSE   46 y.o. female with a history of coronary artery disease, hypertension, irregular heartbeat on propranolol who presents for evaluation of chest pain and palpitations that started one hour PTA. Patient is initial EKG concerning for SVT. Pacer pads paced on patient and she was chemically cardioverted with 6mg  of adenosine. Repeat EKG showing sinus tachycardia which resolved with IV fluids. Patient's symptoms all resolved after she was cardioverted. Thyroid labs within normal limits, no evidence of anemia, patient slightly dehydrated with GFR 51 (baseline 60). Electrolytes within normal limits. Patient was given 1 L of IV fluid and her heart rates currently 88 with a BP of 132/79. Due to patient's history of coronary artery disease and chest pain that she had upon arrival will monitor her and  repeat troponin 3 hours.  Clinical Course as of Sep 27 2013  Mon Sep 27, 2016  2011 Assuming care from Dr. Alfred Levins.  In short, Destiny Singh is a 46 y.o. female with a chief complaint of SVT and chest pain.  Refer to the original H&P for additional details.  The current plan of care is to follow up second troponin and discharge if appropriate.   [CF]  2014 Patient remains in normal sinus rhythm with no chest pain. Second troponin is due at 8:30. Care transferred to Dr. Karma Greaser with plan to DC home with f/u with Cardiology if 2nd trop is negative,  [CV]    Clinical Course User Index [CF] Hinda Kehr, MD Chisholm, [place], MD    Pertinent labs & imaging results that were available during my care of the patient were reviewed by me and considered in my medical decision making (see chart for details).    ____________________________________________   FINAL CLINICAL IMPRESSION(S) / ED DIAGNOSES  Final diagnoses:  SVT (supraventricular tachycardia) (HCC)      NEW MEDICATIONS STARTED DURING THIS VISIT:  New Prescriptions   No medications on file     Note:  This document was prepared using Dragon voice recognition software and may include unintentional dictation errors.    Alfred Levins, [place], MD 09/27/16 2015

## 2016-09-27 NOTE — Discharge Instructions (Addendum)
As we discussed, your workup was generally reassuring with just a slight elevation of your heart enzyme (troponin) which is commonly seen after an episode of supraventricular tachycardia (SVT).  We recommend that you follow up with the listed cardiologist for further evaluation.  Please read through the included information and try some of the techniques we discussed the next time you have the symptoms.  Return to the Emergency Department if you develop new or worsening symptoms that concern you.

## 2016-11-02 ENCOUNTER — Ambulatory Visit: Payer: Medicaid Other | Admitting: Internal Medicine

## 2016-11-03 ENCOUNTER — Encounter: Payer: Self-pay | Admitting: Internal Medicine

## 2017-03-21 ENCOUNTER — Encounter: Payer: Self-pay | Admitting: Nurse Practitioner

## 2017-04-18 ENCOUNTER — Emergency Department: Payer: Self-pay

## 2017-04-18 ENCOUNTER — Encounter: Payer: Self-pay | Admitting: Medical Oncology

## 2017-04-18 ENCOUNTER — Emergency Department
Admission: EM | Admit: 2017-04-18 | Discharge: 2017-04-18 | Disposition: A | Discharge status: Home / Self Care | Payer: Self-pay | Attending: Emergency Medicine | Admitting: Emergency Medicine

## 2017-04-18 DIAGNOSIS — J45909 Unspecified asthma, uncomplicated: Secondary | ICD-10-CM | POA: Insufficient documentation

## 2017-04-18 DIAGNOSIS — M659 Synovitis and tenosynovitis, unspecified: Secondary | ICD-10-CM | POA: Insufficient documentation

## 2017-04-18 DIAGNOSIS — Z79899 Other long term (current) drug therapy: Secondary | ICD-10-CM | POA: Insufficient documentation

## 2017-04-18 DIAGNOSIS — M779 Enthesopathy, unspecified: Secondary | ICD-10-CM

## 2017-04-18 DIAGNOSIS — I10 Essential (primary) hypertension: Secondary | ICD-10-CM | POA: Insufficient documentation

## 2017-04-18 DIAGNOSIS — M778 Other enthesopathies, not elsewhere classified: Secondary | ICD-10-CM

## 2017-04-18 MED ORDER — NAPROXEN 500 MG PO TABS: 500.0000 mg | ORAL_TABLET | Freq: Two times a day (BID) | ORAL | 0 refills | Status: DC

## 2017-04-18 NOTE — Discharge Instructions (Addendum)
Begin taking naproxen immediately.  1 twice daily with food.  Wear thumb spica splint daily.  Ice as needed for pain.  You will need to follow-up with your primary care provider if not improving in 1 week.

## 2017-04-18 NOTE — ED Notes (Signed)
See triage note  Presents with pain to left thumb/wrist area for the past 3-4 days  Unsure of injury  Min swelling noted  Tender on palpation

## 2017-04-18 NOTE — ED Triage Notes (Signed)
Pt c/o left thumb and wrist pain that began 3 days ago. No known injury.

## 2017-04-18 NOTE — ED Provider Notes (Addendum)
[facility] Emergency Department Provider Note  ____________________________________________   First MD Initiated Contact with Patient 04/18/17 [phone]     (approximate)  I have reviewed the triage vital signs and the nursing notes.   HISTORY  Chief Complaint Hand Pain   HPI 44 R Kellenberger is a 47 y.o. female complaint of left thumb and wrist pain that began without injury.  She has been having pain for the last 3 days.  She has not taken any over-the-counter medication for this.  She denies any previous injury in the past.  Pain is increased with any movement of the wrist or thumb.  She rates her pain as a 10/10.  Past Medical History:  Diagnosis Date  . Adopted   . Arthritis    BOTH KNEES  . Asthma   . Benign tumor   . Dyspnea    VERY RARE  . Dysrhythmia    IRREGULAR PER PT  . Headache   . Hypertension   . Myocardial infarction ([place])    AGE 3    There are no active problems to display for this patient.   Past Surgical History:  Procedure Laterality Date  . BREAST SURGERY Bilateral    BENIGN TUMORS  . CHONDROPLASTY Right 04/01/2016   Procedure: CHONDROPLASTY -ABRASION OF FEMORAL TROCHLEA, LATERAL TIBIA Plateau;  Surgeon: Corky Mull, MD;  Location: [facility] ORS;  Service: Orthopedics;  Laterality: Right;  . KNEE ARTHROSCOPY WITH MENISCAL REPAIR Right 04/01/2016   Procedure: KNEE ARTHROSCOPY WITH PARTIAL MEDIAL  MENISCAL REPAIR;  Surgeon: Corky Mull, MD;  Location: [facility] ORS;  Service: Orthopedics;  Laterality: Right;  . TUBAL LIGATION    . TUMOR EXCISION     BENIGN-HEAD    Prior to Admission medications   Medication Sig Start Date End Date Taking? Authorizing Provider  Aspirin-Acetaminophen-Caffeine (GOODY HEADACHE PO) Take 1 packet by mouth daily as needed (headaches).    [provider]  ciclopirox (LOPROX) 0.77 % cream Apply 1 application topically 2 (two) times daily.  01/05/16   [provider]  naproxen (NAPROSYN) 500  MG tablet Take 1 tablet (500 mg total) by mouth 2 (two) times daily with a meal. 04/18/17   Letitia Neri L, PA-C  propranolol ER (INDERAL LA) 60 MG 24 hr capsule Take 60 mg by mouth every morning.  12/19/15   [provider]    Allergies Bee venom and Other  Family History  Adopted: Yes    Social History Social History   Tobacco Use  . Smoking status: Never Smoker  . Smokeless tobacco: Never Used  Substance Use Topics  . Alcohol use: No  . Drug use: No    Review of Systems Constitutional: No fever/chills Cardiovascular: Denies chest pain. Respiratory: Denies shortness of breath. Musculoskeletal: Left thumb and wrist pain. Neurological: Negative for headaches ___________________________________________   PHYSICAL EXAM:  VITAL SIGNS: ED Triage Vitals  Enc Vitals Group     BP 04/18/17 0849 (!) 160/90     Pulse Rate 04/18/17 0849 100     Resp 04/18/17 0849 18     Temp 04/18/17 0849 98.7 F (37.1 C)     Temp Source 04/18/17 0849 Oral     SpO2 04/18/17 0849 98 %     Weight 04/18/17 0847 215 lb (97.5 kg)     Height 04/18/17 0847 5\' 9"  (1.753 m)     Head Circumference --      Peak Flow --      Pain  Score 04/18/17 0847 10     Pain Loc --      Pain Edu? --      Excl. in [place]? --    Constitutional: Alert and oriented. Well appearing and in no acute distress. Eyes: Conjunctivae are normal.  Head: Atraumatic. Neck: No stridor.   Cardiovascular: Normal rate, regular rhythm. Grossly normal heart sounds.  Good peripheral circulation. Respiratory: Normal respiratory effort.  No retractions. Lungs CTAB. Musculoskeletal: On examination of left hand there is no gross deformity or soft tissue swelling noted.  On palpation of the thumb there is no gross deformity and range of motion is guarded.  Patient is able to flex and extend slowly.  This increases pain on the medial aspect of her wrist.  There is marked tenderness on palpation of her wrist and increased pain with  range of motion on flexion and extension.  No soft tissue swelling or erythema is noted.  Pulses present.  Capillary refill is less than 3 seconds.  Skin is intact. Neurologic:  Normal speech and language. No gross focal neurologic deficits are appreciated. No gait instability. Skin:  Skin is warm, dry and intact.  No warmth, erythema or ecchymosis noted. Psychiatric: Mood and affect are normal. Speech and behavior are normal.  ____________________________________________   LABS (all labs ordered are listed, but only abnormal results are displayed)  Labs Reviewed - No data to display  RADIOLOGY  ED MD interpretation:   No acute bony abnormalities are noted.  Official radiology report(s): Dg Hand Complete Left  Result Date: 04/18/2017 CLINICAL DATA:  Left thumb pain, no known injury, initial encounter EXAM: LEFT HAND - COMPLETE 3+ VIEW COMPARISON:  None. FINDINGS: Mild degenerative changes of the first [facility] joint are noted. No acute fracture or dislocation is seen. No gross soft tissue abnormality is noted. IMPRESSION: Mild degenerative change without acute abnormality. Electronically Signed   By: Inez Catalina M.D.   On: 04/18/2017 09:32    ____________________________________________   PROCEDURES  Procedure(s) performed:   .Splint Application Date/Time: 6/71/2458 1:43 PM Performed by: Brayton Caves, NT Authorized by: Johnn Hai, PA-C   Consent:    Consent obtained:  Written   Consent given by:  Patient   Risks discussed:  Numbness   Alternatives discussed:  No treatment Pre-procedure details:    Sensation:  Normal Procedure details:    Laterality:  Left   Location:  Finger   Finger:  L thumb   Splint type:  Thumb spica Post-procedure details:    Pain:  Unchanged   Sensation:  Normal   Patient tolerance of procedure:  Tolerated well, no immediate complications    Critical Care performed: No  ____________________________________________   INITIAL  IMPRESSION / ASSESSMENT AND PLAN / ED COURSE  Clinical Course as of Apr 18 1301  Mon Apr 18, 2017  0998 DG Hand Complete Left [RS]    Clinical Course User Index [RS] Johnn Hai, PA-C  ____________________________________________ Patient was made aware that by examination and x-ray results most likely this is more tendinitis.  She was placed in a thumb spica splint.  She is to follow-up with Dr. Harlow Mares who is the orthopedist on call for further evaluation.  She was placed on naproxen 500 mg twice daily with food.   FINAL CLINICAL IMPRESSION(S) / ED DIAGNOSES  Final diagnoses:  Tendinitis of extensor tendon of left hand     ED Discharge Orders        Ordered    naproxen (  NAPROSYN) 500 MG tablet  2 times daily with meals     04/18/17 2458       Note:  This document was prepared using Dragon voice recognition software and may include unintentional dictation errors.    Johnn Hai, PA-C 04/18/17 1303    Lavonia Drafts, MD 04/18/17 1316    Johnn Hai, PA-C 04/18/17 1344    Lavonia Drafts, MD 04/18/17 1414

## 2017-05-20 ENCOUNTER — Encounter: Payer: Self-pay | Admitting: Nurse Practitioner

## 2017-06-02 ENCOUNTER — Encounter: Payer: Self-pay | Admitting: Internal Medicine

## 2017-08-05 ENCOUNTER — Encounter: Payer: Self-pay | Admitting: Nurse Practitioner

## 2017-09-16 ENCOUNTER — Encounter: Payer: Self-pay | Admitting: Nurse Practitioner

## 2017-09-16 ENCOUNTER — Ambulatory Visit: Payer: Medicaid Other | Admitting: Nurse Practitioner

## 2017-09-16 ENCOUNTER — Encounter (INDEPENDENT_AMBULATORY_CARE_PROVIDER_SITE_OTHER): Payer: Self-pay

## 2017-09-16 VITALS — BP 148/104 | HR 102 | Resp 16 | Ht 68.5 in | Wt 258.0 lb

## 2017-09-16 DIAGNOSIS — G43009 Migraine without aura, not intractable, without status migrainosus: Secondary | ICD-10-CM | POA: Diagnosis not present

## 2017-09-16 DIAGNOSIS — Z1231 Encounter for screening mammogram for malignant neoplasm of breast: Secondary | ICD-10-CM | POA: Diagnosis not present

## 2017-09-16 DIAGNOSIS — I10 Essential (primary) hypertension: Secondary | ICD-10-CM

## 2017-09-16 DIAGNOSIS — Z1239 Encounter for other screening for malignant neoplasm of breast: Secondary | ICD-10-CM

## 2017-09-16 DIAGNOSIS — Z0001 Encounter for general adult medical examination with abnormal findings: Secondary | ICD-10-CM | POA: Diagnosis not present

## 2017-09-16 DIAGNOSIS — R300 Dysuria: Secondary | ICD-10-CM

## 2017-09-16 MED ORDER — PROPRANOLOL HCL ER 60 MG PO CP24: 60.0000 mg | ORAL_CAPSULE | ORAL | 5 refills | Status: DC

## 2017-09-16 NOTE — Progress Notes (Signed)
[facility] [place], [place] [address]  Internal MEDICINE  Office Visit Note  Patient Name: Destiny Singh  [id number]  [id number]  Date of Service: 10/12/2017   Pt is here for routine health maintenance examination  Chief Complaint  Patient presents with  . Annual Exam  . Hypertension     Hypertension  This is a chronic problem. The current episode started more than 1 year ago. The problem has been gradually worsening (has been out of medications.) since onset. The problem is resistant. Associated symptoms include headaches, malaise/fatigue and palpitations. Pertinent negatives include no chest pain, neck pain or shortness of breath. Agents associated with hypertension include NSAIDs. Risk factors for coronary artery disease include obesity and stress. Past treatments include beta blockers. The current treatment provides moderate improvement. Compliance problems include medication cost.      Current Medication: Outpatient Encounter Medications as of 09/16/2017  Medication Sig  . Aspirin-Acetaminophen-Caffeine (GOODY HEADACHE PO) Take 1 packet by mouth daily as needed (headaches).  . ciclopirox (LOPROX) 0.77 % cream Apply 1 application topically 2 (two) times daily.   . naproxen (NAPROSYN) 500 MG tablet Take 1 tablet (500 mg total) by mouth 2 (two) times daily with a meal.  . propranolol ER (INDERAL LA) 60 MG 24 hr capsule Take 1 capsule (60 mg total) by mouth every morning.  . [DISCONTINUED] propranolol ER (INDERAL LA) 60 MG 24 hr capsule Take 60 mg by mouth every morning.    No facility-administered encounter medications on file as of 09/16/2017.     Surgical History: Past Surgical History:  Procedure Laterality Date  . BREAST SURGERY Bilateral    BENIGN TUMORS  . CHONDROPLASTY Right 04/01/2016   Procedure: CHONDROPLASTY -ABRASION OF FEMORAL TROCHLEA, LATERAL TIBIA Plateau;  Surgeon: Corky Mull, MD;  Location: [facility] ORS;  Service: Orthopedics;   Laterality: Right;  . KNEE ARTHROSCOPY WITH MENISCAL REPAIR Right 04/01/2016   Procedure: KNEE ARTHROSCOPY WITH PARTIAL MEDIAL  MENISCAL REPAIR;  Surgeon: Corky Mull, MD;  Location: [facility] ORS;  Service: Orthopedics;  Laterality: Right;  . TUBAL LIGATION    . TUMOR EXCISION     BENIGN-HEAD    Medical History: Past Medical History:  Diagnosis Date  . Adopted   . Arthritis    BOTH KNEES  . Asthma   . Benign tumor   . Dyspnea    VERY RARE  . Dysrhythmia    IRREGULAR PER PT  . Headache   . Hypertension   . Myocardial infarction ([place])    AGE 47    Family History: Family History  Adopted: Yes      Review of Systems  Constitutional: Positive for fatigue and malaise/fatigue. Negative for chills and unexpected weight change.  HENT: Negative for postnasal drip, rhinorrhea, sneezing and sore throat.   Eyes: Negative.  Negative for redness.  Respiratory: Negative for cough, chest tightness and shortness of breath.   Cardiovascular: Positive for palpitations. Negative for chest pain.  Gastrointestinal: Negative for abdominal pain, constipation, diarrhea, nausea and vomiting.  Endocrine: Negative for cold intolerance, heat intolerance, polydipsia, polyphagia and polyuria.  Genitourinary: Negative.  Negative for dysuria and frequency.  Musculoskeletal: Positive for arthralgias, back pain and myalgias. Negative for joint swelling and neck pain.  Skin: Negative for rash.  Allergic/Immunologic: Negative for environmental allergies.  Neurological: Positive for headaches. Negative for tremors and numbness.  Hematological: Negative for adenopathy. Does not bruise/bleed easily.  Psychiatric/Behavioral: Positive for dysphoric mood. Negative for behavioral problems (Depression),  sleep disturbance and suicidal ideas. The patient is nervous/anxious.      Today's Vitals   09/16/17 1058  BP: (!) 148/104  Pulse: (!) 102  Resp: 16  SpO2: 98%  Weight: 258 lb (117 kg)  Height: 5' 8.5" (1.74  m)   Physical Exam  Constitutional: She is oriented to person, place, and time. She appears well-developed and well-nourished. No distress.  HENT:  Head: Normocephalic and atraumatic.  Nose: Nose normal.  Mouth/Throat: No oropharyngeal exudate.  Eyes: Pupils are equal, round, and reactive to light. Conjunctivae and EOM are normal.  Neck: Normal range of motion. Neck supple. No JVD present. No tracheal deviation present. No thyromegaly present.  Cardiovascular: Normal rate, regular rhythm, normal heart sounds and intact distal pulses. Exam reveals no gallop and no friction rub.  No murmur heard. Pulmonary/Chest: Effort normal and breath sounds normal. No respiratory distress. She has no wheezes. She has no rales. She exhibits no tenderness. Right breast exhibits no inverted nipple, no mass, no nipple discharge, no skin change and no tenderness. Left breast exhibits no inverted nipple, no mass, no nipple discharge, no skin change and no tenderness.  Abdominal: Soft. Bowel sounds are normal. There is no tenderness.  Musculoskeletal: Normal range of motion.  Lymphadenopathy:    She has no cervical adenopathy.  Neurological: She is alert and oriented to person, place, and time. No cranial nerve deficit.  Skin: Skin is warm and dry. Capillary refill takes less than 2 seconds. She is not diaphoretic.  Psychiatric: She has a normal mood and affect. Her behavior is normal. Judgment and thought content normal.  Nursing note and vitals reviewed.    LABS: Recent Results (from the past 2160 hour(s))  UA/M w/rflx Culture, Routine     Status: Abnormal   Collection Time: 09/16/17 11:23 AM  Result Value Ref Range   Specific Gravity, UA 1.017 1.005 - 1.030   pH, UA 7.0 5.0 - 7.5   Color, UA Yellow Yellow   Appearance Ur Clear Clear   Leukocytes, UA Negative Negative   Protein, UA Negative Negative/Trace   Glucose, UA Negative Negative   Ketones, UA Negative Negative   RBC, UA Trace (A) Negative    Bilirubin, UA Negative Negative   Urobilinogen, Ur 0.2 0.2 - 1.0 mg/dL   Nitrite, UA Negative Negative   Microscopic Examination See below:     Comment: Microscopic was indicated and was performed.   Urinalysis Reflex Comment     Comment: This specimen will not reflex to a Urine Culture.  Microscopic Examination     Status: Abnormal   Collection Time: 09/16/17 11:23 AM  Result Value Ref Range   WBC, UA 0-5 0 - 5 /hpf   RBC, UA 3-10 (A) 0 - 2 /hpf   Epithelial Cells (non renal) 0-10 0 - 10 /hpf   Casts None seen None seen /lpf   Mucus, UA Present Not Estab.   Bacteria, UA None seen None seen/Few  Specimen status report     Status: None (Preliminary result)   Collection Time: 09/16/17 11:23 AM  Result Value Ref Range   specimen status report Comment     Comment: No Micro Urine Received    Assessment/Plan: 1. Encounter for general adult medical examination with abnormal findings Annual wellness visit today.   2. Essential hypertension Add back propranolol ER 60mg  daily to lower BP and control migraines.  - propranolol ER (INDERAL LA) 60 MG 24 hr capsule; Take 1 capsule (60 mg total)  by mouth every morning.  Dispense: 30 capsule; Refill: 5  3. Migraine without aura and without status migrainosus, not intractable Restart propranolol Er 60mg  daily to help prevent migraines and lower bp. Use OTC treatment as needed to reileve acute headaches.   4. Screening for breast cancer - MM DIGITAL SCREENING BILATERAL; Future  5. Dysuria - UA/M w/rflx Culture, Routine  General Counseling: Destiny Singh verbalizes understanding of the findings of todays visit and agrees with plan of treatment. I have discussed any further diagnostic evaluation that may be needed or ordered today. We also reviewed her medications today. she has been encouraged to call the office with any questions or concerns that should arise related to todays visit.    Counseling:  Hypertension Counseling:   The following  hypertensive lifestyle modification were recommended and discussed:  1. Limiting alcohol intake to less than 1 oz/[place] of ethanol:(24 oz of beer or 8 oz of wine or 2 oz of 100-proof whiskey). 2. Take baby ASA 81 mg daily. 3. Importance of regular aerobic exercise and losing weight. 4. Reduce dietary saturated fat and cholesterol intake for overall cardiovascular health. 5. Maintaining adequate dietary potassium, calcium, and magnesium intake. 6. Regular monitoring of the blood pressure. 7. Reduce sodium intake to less than 100 mmol/[place] (less than 2.3 gm of sodium or less than 6 gm of sodium choride)   This patient was seen by [place] with Dr Lavera Guise as a part of collaborative care agreement  Orders Placed This Encounter  Procedures  . Microscopic Examination  . MM DIGITAL SCREENING BILATERAL  . UA/M w/rflx Culture, Routine  . Specimen status report    Meds ordered this encounter  Medications  . propranolol ER (INDERAL LA) 60 MG 24 hr capsule    Sig: Take 1 capsule (60 mg total) by mouth every morning.    Dispense:  30 capsule    Refill:  5    Order Specific Question:   Supervising Provider    Answer:   Lavera Guise [[id number]]    Time spent: [place], MD  Internal Medicine

## 2017-09-17 LAB — SPECIMEN STATUS REPORT

## 2017-09-28 LAB — UA/M W/RFLX CULTURE, ROUTINE
Bilirubin, UA: NEGATIVE
GLUCOSE, UA: NEGATIVE
Ketones, UA: NEGATIVE
Leukocytes, UA: NEGATIVE
NITRITE UA: NEGATIVE
PH UA: 7 (ref 5.0–7.5)
Protein, UA: NEGATIVE
Specific Gravity, UA: 1.017 (ref 1.005–1.030)
Urobilinogen, Ur: 0.2 mg/dL (ref 0.2–1.0)

## 2017-09-28 LAB — MICROSCOPIC EXAMINATION
Bacteria, UA: NONE SEEN
CASTS: NONE SEEN /LPF

## 2017-10-12 DIAGNOSIS — Z1239 Encounter for other screening for malignant neoplasm of breast: Secondary | ICD-10-CM | POA: Insufficient documentation

## 2017-10-12 DIAGNOSIS — G43009 Migraine without aura, not intractable, without status migrainosus: Secondary | ICD-10-CM | POA: Insufficient documentation

## 2017-10-12 DIAGNOSIS — R300 Dysuria: Secondary | ICD-10-CM | POA: Insufficient documentation

## 2017-10-12 DIAGNOSIS — Z124 Encounter for screening for malignant neoplasm of cervix: Secondary | ICD-10-CM | POA: Insufficient documentation

## 2017-10-12 DIAGNOSIS — I10 Essential (primary) hypertension: Secondary | ICD-10-CM | POA: Insufficient documentation

## 2018-03-21 ENCOUNTER — Ambulatory Visit: Payer: Self-pay | Admitting: Nurse Practitioner

## 2018-08-19 ENCOUNTER — Encounter: Payer: Self-pay | Admitting: Emergency Medicine

## 2018-08-19 ENCOUNTER — Emergency Department
Admission: EM | Admit: 2018-08-19 | Discharge: 2018-08-19 | Disposition: A | Discharge status: Home / Self Care | Payer: Self-pay | Attending: Emergency Medicine | Admitting: Emergency Medicine

## 2018-08-19 ENCOUNTER — Emergency Department: Payer: Self-pay

## 2018-08-19 ENCOUNTER — Other Ambulatory Visit: Payer: Self-pay

## 2018-08-19 DIAGNOSIS — J45909 Unspecified asthma, uncomplicated: Secondary | ICD-10-CM | POA: Insufficient documentation

## 2018-08-19 DIAGNOSIS — I499 Cardiac arrhythmia, unspecified: Secondary | ICD-10-CM | POA: Insufficient documentation

## 2018-08-19 DIAGNOSIS — R0789 Other chest pain: Secondary | ICD-10-CM | POA: Insufficient documentation

## 2018-08-19 DIAGNOSIS — I252 Old myocardial infarction: Secondary | ICD-10-CM | POA: Insufficient documentation

## 2018-08-19 DIAGNOSIS — I10 Essential (primary) hypertension: Secondary | ICD-10-CM | POA: Insufficient documentation

## 2018-08-19 LAB — CBC
HCT: 44.6 % (ref 36.0–46.0)
Hemoglobin: 14 g/dL (ref 12.0–15.0)
MCH: 24.8 pg — ABNORMAL LOW (ref 26.0–34.0)
MCHC: 31.4 g/dL (ref 30.0–36.0)
MCV: 78.9 fL — ABNORMAL LOW (ref 80.0–100.0)
Platelets: 348 10*3/uL (ref 150–400)
RBC: 5.65 MIL/uL — ABNORMAL HIGH (ref 3.87–5.11)
RDW: 16.3 % — ABNORMAL HIGH (ref 11.5–15.5)
WBC: 9.7 10*3/uL (ref 4.0–10.5)
nRBC: 0 % (ref 0.0–0.2)

## 2018-08-19 LAB — TROPONIN I
Troponin I: <0.03 ng/mL (ref <0.03)
Troponin I: <0.03 ng/mL (ref <0.03)

## 2018-08-19 LAB — FIBRIN DERIVATIVES D-DIMER (ARMC ONLY): Fibrin derivatives D-dimer (ARMC): 445.71 ng/mL (FEU) (ref 0.00–499.00)

## 2018-08-19 LAB — BASIC METABOLIC PANEL
Anion gap: 10 (ref 5–15)
BUN: 19 mg/dL (ref 6–20)
CO2: 26 mmol/L (ref 22–32)
Calcium: 9.2 mg/dL (ref 8.9–10.3)
Chloride: 105 mmol/L (ref 98–111)
Creatinine, Ser: 1.02 mg/dL — ABNORMAL HIGH (ref 0.44–1.00)
GFR calc Af Amer: >60 mL/min (ref >60)
GFR calc non Af Amer: >60 mL/min (ref >60)
Glucose, Bld: 103 mg/dL — ABNORMAL HIGH (ref 70–99)
Potassium: 3.7 mmol/L (ref 3.5–5.1)
Sodium: 141 mmol/L (ref 135–145)

## 2018-08-19 MED ORDER — MORPHINE SULFATE (PF) 4 MG/ML IV SOLN
6.0000 mg | Freq: Once | INTRAVENOUS | Status: AC
  Administered 2018-08-19: 6 mg via INTRAVENOUS
  Filled 2018-08-19: qty 2

## 2018-08-19 MED ORDER — ONDANSETRON HCL 4 MG/2ML IJ SOLN
4.0000 mg | Freq: Once | INTRAMUSCULAR | Status: AC
  Administered 2018-08-19: 4 mg via INTRAVENOUS
  Filled 2018-08-19: qty 2

## 2018-08-19 MED ORDER — METOPROLOL SUCCINATE ER 50 MG PO TB24: 50.0000 mg | ORAL_TABLET | Freq: Every day | ORAL | 0 refills | Status: DC

## 2018-08-19 MED ORDER — METOPROLOL TARTRATE 25 MG PO TABS
25.0000 mg | ORAL_TABLET | Freq: Once | ORAL | Status: AC
  Administered 2018-08-19: 25 mg via ORAL
  Filled 2018-08-19: qty 1

## 2018-08-19 MED ORDER — SODIUM CHLORIDE 0.9 % IV BOLUS
1000.0000 mL | Freq: Once | INTRAVENOUS | Status: AC
  Administered 2018-08-19: 1000 mL via INTRAVENOUS

## 2018-08-19 NOTE — ED Triage Notes (Signed)
States palpatations on and off x 4 days. States has happened in the past with no dx.

## 2018-08-19 NOTE — ED Notes (Signed)
Patient transported to X-ray 

## 2018-08-19 NOTE — ED Provider Notes (Signed)
[facility] Emergency Department Provider Note  ____________________________________________   First MD Initiated Contact with Patient 08/19/18 1152     (approximate)  I have reviewed the triage vital signs and the nursing notes.   HISTORY  Chief Complaint Chest Pain    HPI 72 Destiny Singh is a 48 y.o. female  Here with palpitations and chest pain. Pt reports that over the past 3-4 days, she's had intermittent episodes in which she feels her heart beating quickly, followed by a sensation of mild chest pressure, anxiety, and her heart "beating out of (her) chest." She states the sx seem to come and go randomly and are not associated with stress or exertion. No change in exercise or work tolerance. No specific alleviating factors other htan time and rest. The episodes have become more frequent recently for which she presents today. She states her sx have resolved on arrival. Of note, she has a h/o SVT in past, but did not f/u with Cardiology. Denies any alcohol use. No stimulant or heavy caffeine intake. Denies any LE swelling. She has a mild chest pressure currently but this is common after these episodes. No n/v/diaphoresis.       Past Medical History:  Diagnosis Date  . Adopted   . Arthritis    BOTH KNEES  . Asthma   . Benign tumor   . Dyspnea    VERY RARE  . Dysrhythmia    IRREGULAR PER PT  . Headache   . Hypertension   . Myocardial infarction ([place])    AGE 6    Patient Active Problem List   Diagnosis Date Noted  . Screening for breast cancer 10/12/2017  . Essential hypertension 10/12/2017  . Migraine without aura and without status migrainosus, not intractable 10/12/2017  . Dysuria 10/12/2017    Past Surgical History:  Procedure Laterality Date  . BREAST SURGERY Bilateral    BENIGN TUMORS  . CHONDROPLASTY Right 04/01/2016   Procedure: CHONDROPLASTY -ABRASION OF FEMORAL TROCHLEA, LATERAL TIBIA Plateau;  Surgeon: Corky Mull, MD;  Location:  [facility] ORS;  Service: Orthopedics;  Laterality: Right;  . KNEE ARTHROSCOPY WITH MENISCAL REPAIR Right 04/01/2016   Procedure: KNEE ARTHROSCOPY WITH PARTIAL MEDIAL  MENISCAL REPAIR;  Surgeon: Corky Mull, MD;  Location: [facility] ORS;  Service: Orthopedics;  Laterality: Right;  . TUBAL LIGATION    . TUMOR EXCISION     BENIGN-HEAD    Prior to Admission medications   Medication Sig Start Date End Date Taking? Authorizing Provider  metoprolol succinate (TOPROL XL) 50 MG 24 hr tablet Take 1 tablet (50 mg total) by mouth daily for 30 days. Take with or immediately following a meal. 08/19/18 09/18/18  Duffy Bruce, MD    Allergies Bee venom and Other  Family History  Adopted: Yes    Social History Social History   Tobacco Use  . Smoking status: Never Smoker  . Smokeless tobacco: Never Used  Substance Use Topics  . Alcohol use: No  . Drug use: No    Review of Systems    Review of Systems  Constitutional: Positive for fatigue. Negative for chills and fever.  HENT: Negative for congestion and rhinorrhea.   Eyes: Negative for visual disturbance.  Respiratory: Positive for chest tightness. Negative for cough and shortness of breath.   Cardiovascular: Positive for chest pain and palpitations. Negative for leg swelling.  Gastrointestinal: Negative for abdominal pain, diarrhea, nausea and vomiting.  Genitourinary: Negative for dysuria.  Musculoskeletal: Negative for neck pain  and neck stiffness.  Skin: Negative for rash.  Allergic/Immunologic: Negative for immunocompromised state.  Neurological: Positive for weakness.  All other systems reviewed and are negative.    ____________________________________________  PHYSICAL EXAM:      VITAL SIGNS: ED Triage Vitals  Enc Vitals Group     BP 08/19/18 1131 (!) 174/100     Pulse Rate 08/19/18 1131 (!) 110     Resp 08/19/18 1131 20     Temp 08/19/18 1131 98.1 F (36.7 C)     Temp src --      SpO2 08/19/18 1131 97 %     Weight 08/19/18  1129 260 lb (117.9 kg)     Height 08/19/18 1124 5\' 9"  (1.753 m)     Head Circumference --      Peak Flow --      Pain Score 08/19/18 1124 10     Pain Loc --      Pain Edu? --      Excl. in [place] Singh? --      Physical Exam Vitals signs and nursing note reviewed.  Constitutional:      General: She is not in acute distress.    Appearance: She is well-developed.  HENT:     Head: Normocephalic and atraumatic.  Eyes:     Conjunctiva/sclera: Conjunctivae normal.  Neck:     Musculoskeletal: Neck supple.  Cardiovascular:     Rate and Rhythm: Regular rhythm. Tachycardia present.     Heart sounds: Normal heart sounds. No murmur. No friction rub.  Pulmonary:     Effort: Pulmonary effort is normal. No respiratory distress.     Breath sounds: Normal breath sounds. No wheezing or rales.  Abdominal:     General: There is no distension.     Palpations: Abdomen is soft.     Tenderness: There is no abdominal tenderness.  Skin:    General: Skin is warm.     Capillary Refill: Capillary refill takes less than 2 seconds.  Neurological:     Mental Status: She is alert and oriented to person, place, and time.     Motor: No abnormal muscle tone.       ____________________________________________   LABS (all labs ordered are listed, but only abnormal results are displayed)  Labs Reviewed  BASIC METABOLIC PANEL - Abnormal; Notable for the following components:      Result Value   Glucose, Bld 103 (*)    Creatinine, Ser 1.02 (*)    All other components within normal limits  CBC - Abnormal; Notable for the following components:   RBC 5.65 (*)    MCV 78.9 (*)    MCH 24.8 (*)    RDW 16.3 (*)    All other components within normal limits  TROPONIN I  FIBRIN DERIVATIVES D-DIMER ([facility] ONLY)  TROPONIN I    ____________________________________________  EKG: See below. Sinus tach, no ischemia, PVCs noted.   EKG Interpretation  Date/Time:  Saturday August 19 2018 11:15:50 EDT Ventricular Rate:   118 PR Interval:  160 QRS Duration: 78 QT Interval:  314 QTC Calculation: 440 Destiny Axis:   17 Text Interpretation:  Sinus tachycardia with occasional Premature ventricular complexes Possible Left atrial enlargement Nonspecific ST and T wave abnormality Abnormal ECG When compared with ECG of 27-Sep-2016 17:22, PREVIOUS ECG IS PRESENT Since last EKG, PVCs are less frequent Otherwise no significant change Confirmed by Duffy Bruce [phone]) on 08/19/2018 7:53:59 PM       ________________________________________  RADIOLOGY All  imaging, including plain films, CT scans, and ultrasounds, independently reviewed by me, and interpretations confirmed via formal radiology reads.  ED MD interpretation:   CXR: No apparent focal PNA, PTX ,or abnormality.  Official radiology report(s): Dg Chest 2 View  Result Date: 08/19/2018 CLINICAL DATA:  Chest and back pain beginning today. EXAM: CHEST - 2 VIEW COMPARISON:  10/15/2009 FINDINGS: The heart size and mediastinal contours are within normal limits. Mild linear opacity in the lateral left lung base may be due to scarring or atelectasis. No evidence of pulmonary airspace disease or edema. No evidence of pleural effusion. The visualized skeletal structures are unremarkable. IMPRESSION: Mild left basilar atelectasis versus scarring. Electronically Signed   By: Earle Gell M.D.   On: 08/19/2018 12:16    ____________________________________________  PROCEDURES   Procedure(s) performed (including Critical Care):  Procedures  ____________________________________________  INITIAL IMPRESSION / MDM / [place] / ED COURSE  As part of my medical decision making, I reviewed the following data within the electronic MEDICAL RECORD NUMBER Notes from prior ED visits and [place] Controlled Substance Database      *Avalin Destiny Mould was evaluated in Emergency Department on 08/19/2018 for the symptoms described in the history of present illness. She was evaluated in the  context of the global COVID-19 pandemic, which necessitated consideration that the patient might be at risk for infection with the SARS-CoV-2 virus that causes COVID-19. Institutional protocols and algorithms that pertain to the evaluation of patients at risk for COVID-19 are in a state of rapid change based on information released by regulatory bodies including the CDC and federal and state organizations. These policies and algorithms were followed during the patient's care in the ED.  Some ED evaluations and interventions may be delayed as a result of limited staffing during the pandemic.*      Medical Decision Making: 48 yo F here with atypical CP and palpitations. H/o SVT. On arrival here, her sx are improving but she was initially in sinus tachycardia with frequent PVCs. I suspect she may be intermittently going into symptomatic SVT. EKG is o/w non-ischemic and trop neg x 2 - doubt ACS. D-Dimer neg and she has no other sx of PE or dissection. Labs reassuring. CXR clear. No cardiomegaly or signs of CHF. Given her recurrent visits for same with inability to arrange f/u in past, will start on low dose metop and refer back for f/u. Importance of close f/u reiterated.  ____________________________________________  FINAL CLINICAL IMPRESSION(S) / ED DIAGNOSES  Final diagnoses:  Cardiac arrhythmia, unspecified cardiac arrhythmia type  Atypical chest pain     MEDICATIONS GIVEN DURING THIS VISIT:  Medications  metoprolol tartrate (LOPRESSOR) tablet 25 mg (25 mg Oral Given 08/19/18 1232)  morphine 4 MG/ML injection 6 mg (6 mg Intravenous Given 08/19/18 1239)  ondansetron (ZOFRAN) injection 4 mg (4 mg Intravenous Given 08/19/18 1239)  sodium chloride 0.9 % bolus 1,000 mL (0 mLs Intravenous Stopped 08/19/18 1619)     ED Discharge Orders         Ordered    metoprolol succinate (TOPROL XL) 50 MG 24 hr tablet  Daily     08/19/18 1550           Note:  This document was prepared using Dragon  voice recognition software and may include unintentional dictation errors.   Duffy Bruce, MD 08/19/18 [phone]

## 2018-09-12 ENCOUNTER — Telehealth: Payer: Self-pay | Admitting: Cardiovascular Disease

## 2018-09-12 NOTE — Telephone Encounter (Signed)
Virtual Visit Pre-Appointment Phone Call  "(Name), I am calling you today to discuss your upcoming appointment. We are currently trying to limit exposure to the virus that causes COVID-19 by seeing patients at home rather than in the office."  1. "What is the BEST phone number to call the [place] of the visit?" - include this in appointment notes  2. Do you have or have access to (through a family member/friend) a smartphone with video capability that we can use for your visit?" a. If yes - list this number in appt notes as cell (if different from BEST phone #) and list the appointment type as a VIDEO visit in appointment notes b. If no - list the appointment type as a PHONE visit in appointment notes  3. Confirm consent - "In the setting of the current Covid19 crisis, you are scheduled for a (phone or video) visit with your provider on (date) at (time).  Just as we do with many in-office visits, in order for you to participate in this visit, we must obtain consent.  If you'd like, I can send this to your mychart (if signed up) or email for you to review.  Otherwise, I can obtain your verbal consent now.  All virtual visits are billed to your insurance company just like a normal visit would be.  By agreeing to a virtual visit, we'd like you to understand that the technology does not allow for your provider to perform an examination, and thus may limit your provider's ability to fully assess your condition. If your provider identifies any concerns that need to be evaluated in person, we will make arrangements to do so.  Finally, though the technology is pretty good, we cannot assure that it will always work on either your or our end, and in the setting of a video visit, we may have to convert it to a phone-only visit.  In either situation, we cannot ensure that we have a secure connection.  Are you willing to proceed?" STAFF: Did the patient verbally acknowledge consent to telehealth visit? Document  YES/NO here: YES  4. Advise patient to be prepared - "Two hours prior to your appointment, go ahead and check your blood pressure, pulse, oxygen saturation, and your weight (if you have the equipment to check those) and write them all down. When your visit starts, your provider will ask you for this information. If you have an Apple Watch or Kardia device, please plan to have heart rate information ready on the [place] of your appointment. Please have a pen and paper handy nearby the [place] of the visit as well."  5. Give patient instructions for MyChart download to smartphone OR Doximity/Doxy.me as below if video visit (depending on what platform provider is using)  6. Inform patient they will receive a phone call 15 minutes prior to their appointment time (may be from unknown caller ID) so they should be prepared to answer    TELEPHONE CALL NOTE  Destiny Singh has been deemed a candidate for a follow-up tele-health visit to limit community exposure during the Covid-19 pandemic. I spoke with the patient via phone to ensure availability of phone/video source, confirm preferred email & phone number, and discuss instructions and expectations.  I reminded Destiny Singh to be prepared with any vital sign and/or heart rhythm information that could potentially be obtained via home monitoring, at the time of her visit. I reminded Destiny Singh to expect a phone call prior to  her visit.  Destiny Singh 09/12/2018 11:03 AM   INSTRUCTIONS FOR DOWNLOADING THE MYCHART APP TO SMARTPHONE  - The patient must first make sure to have activated MyChart and know their login information - If Apple, go to [employer] and type in MyChart in the search bar and download the app. If Android, ask patient to go to [employer] and type in [place] in the search bar and download the app. The app is free but as with any other app downloads, their phone may require them to verify saved payment information or Apple/Android password.    - The patient will need to then log into the app with their MyChart username and password, and select [facility] as their healthcare provider to link the account. When it is time for your visit, go to the MyChart app, find appointments, and click Begin Video Visit. Be sure to Select Allow for your device to access the Microphone and Camera for your visit. You will then be connected, and your provider will be with you shortly.  **If they have any issues connecting, or need assistance please contact MyChart service desk [phone])**  **If using a computer, in order to ensure the best quality for their visit they will need to use either of the following Internet Browsers: [employer], or Google Chrome**  IF USING DOXIMITY or DOXY.ME - The patient will receive a link just prior to their visit by text.     FULL LENGTH CONSENT FOR TELE-HEALTH VISIT   I hereby voluntarily request, consent and authorize [facility] and its employed or contracted physicians, physician assistants, nurse practitioners or other licensed health care professionals (the Practitioner), to provide me with telemedicine health care services (the Services") as deemed necessary by the treating Practitioner. I acknowledge and consent to receive the Services by the Practitioner via telemedicine. I understand that the telemedicine visit will involve communicating with the Practitioner through live audiovisual communication technology and the disclosure of certain medical information by electronic transmission. I acknowledge that I have been given the opportunity to request an in-person assessment or other available alternative prior to the telemedicine visit and am voluntarily participating in the telemedicine visit.  I understand that I have the right to withhold or withdraw my consent to the use of telemedicine in the course of my care at any time, without affecting my right to future care or treatment, and that  the Practitioner or I may terminate the telemedicine visit at any time. I understand that I have the right to inspect all information obtained and/or recorded in the course of the telemedicine visit and may receive copies of available information for a reasonable fee.  I understand that some of the potential risks of receiving the Services via telemedicine include:   Delay or interruption in medical evaluation due to technological equipment failure or disruption;  Information transmitted may not be sufficient (e.g. poor resolution of images) to allow for appropriate medical decision making by the Practitioner; and/or   In rare instances, security protocols could fail, causing a breach of personal health information.  Furthermore, I acknowledge that it is my responsibility to provide information about my medical history, conditions and care that is complete and accurate to the best of my ability. I acknowledge that Practitioner's advice, recommendations, and/or decision may be based on factors not within their control, such as incomplete or inaccurate data provided by me or distortions of diagnostic images or specimens that may result from electronic transmissions. I  understand that the practice of medicine is not an exact science and that Practitioner makes no warranties or guarantees regarding treatment outcomes. I acknowledge that I will receive a copy of this consent concurrently upon execution via email to the email address I last provided but may also request a printed copy by calling the office of [facility].    I understand that my insurance will be billed for this visit.   I have read or had this consent read to me.  I understand the contents of this consent, which adequately explains the benefits and risks of the Services being provided via telemedicine.   I have been provided ample opportunity to ask questions regarding this consent and the Services and have had my questions answered to  my satisfaction.  I give my informed consent for the services to be provided through the use of telemedicine in my medical care  By participating in this telemedicine visit I agree to the above.

## 2018-09-22 ENCOUNTER — Other Ambulatory Visit: Payer: Self-pay | Admitting: Nurse Practitioner

## 2018-09-26 DIAGNOSIS — R079 Chest pain, unspecified: Secondary | ICD-10-CM | POA: Insufficient documentation

## 2018-09-26 DIAGNOSIS — R002 Palpitations: Secondary | ICD-10-CM | POA: Insufficient documentation

## 2018-09-26 NOTE — Progress Notes (Signed)
Virtual Visit via Video Note   This visit type was conducted due to national recommendations for restrictions regarding the COVID-19 Pandemic (e.g. social distancing) in an effort to limit this patient's exposure and mitigate transmission in our community.  Due to her co-morbid illnesses, this patient is at least at moderate risk for complications without adequate follow up.  This format is felt to be most appropriate for this patient at this time.  All issues noted in this document were discussed and addressed.  A limited physical exam was performed with this format.  Please refer to the patient's chart for her consent to telehealth for [facility].   I connected with  Tannisha R Campusano on 09/27/18 by a video enabled telemedicine application and verified that I am speaking with the correct person using two identifiers. I discussed the limitations of evaluation and management by telemedicine. The patient expressed understanding and agreed to proceed.   Evaluation Performed:  Follow-up visit  Date:  09/27/2018   ID:  Siddhi Dornbush Dasaro, DOB 1970-03-09, MRN [id number]  Patient Location:  [address] [place] [address]   Provider location:   [facility], [place] office  PCP:  Ronnell Freshwater, NP  Cardiologist:  Patsy Baltimore   Chief Complaint:  Palpitations, HTN  History of Present Illness:    Alexie R Sprenkle is a 48 y.o. female who presents via audio/video conferencing for a telehealth visit today.   The patient does not symptoms concerning for COVID-19 infection (fever, chills, cough, or new SHORTNESS OF BREATH).   Patient has a past medical history of HTN tachycardia Referred by the emergency room for palpitations and chest pain  Seen in the emergency room August 19, 2018 palpitations and chest pain.   past 3-4 days,intermittent   feels her heart beating quickly,  sensation of mild chest pressure, anxiety, heart "beating out of (her) chest."   come and go  randomly and are not associated with stress or exertion.    h/o SVT in past, but did not f/u with Cardiology.  Denies any alcohol use.   In the emergency room blood pressure 174/100 heart rate 110  Daily palpitations, They hurt,   Longstanding issue, Blood pressure always running high, not uncommon to have 742 systolic, no improvement in blood pressure numbers on metoprolol succinate 50 daily    Prior CV studies:   The following studies were reviewed today:    Past Medical History:  Diagnosis Date  . Adopted   . Arthritis    BOTH KNEES  . Asthma   . Benign tumor   . Dyspnea    VERY RARE  . Dysrhythmia    IRREGULAR PER PT  . Headache   . Hypertension   . Myocardial infarction ([place])    AGE 13   Past Surgical History:  Procedure Laterality Date  . BREAST SURGERY Bilateral    BENIGN TUMORS  . CHONDROPLASTY Right 04/01/2016   Procedure: CHONDROPLASTY -ABRASION OF FEMORAL TROCHLEA, LATERAL TIBIA Plateau;  Surgeon: Corky Mull, MD;  Location: [facility] ORS;  Service: Orthopedics;  Laterality: Right;  . KNEE ARTHROSCOPY WITH MENISCAL REPAIR Right 04/01/2016   Procedure: KNEE ARTHROSCOPY WITH PARTIAL MEDIAL  MENISCAL REPAIR;  Surgeon: Corky Mull, MD;  Location: [facility] ORS;  Service: Orthopedics;  Laterality: Right;  . TUBAL LIGATION    . TUMOR EXCISION     BENIGN-HEAD     No outpatient medications have been marked as taking for the  09/27/18 encounter (Telemedicine) with Minna Merritts, MD.     Allergies:   Bee venom and Other   Social History   Tobacco Use  . Smoking status: Never Smoker  . Smokeless tobacco: Never Used  Substance Use Topics  . Alcohol use: No  . Drug use: No     Current Outpatient Medications on File Prior to Visit  Medication Sig Dispense Refill  . metoprolol succinate (TOPROL XL) 50 MG 24 hr tablet Take 1 tablet (50 mg total) by mouth daily for 30 days. Take with or immediately following a meal. 30 tablet 0   No current facility-administered  medications on file prior to visit.      Family Hx: The patient's family history is not on file. She was adopted.  ROS:   Please see the history of present illness.    Review of Systems  Constitutional: Negative.   HENT: Negative.   Respiratory: Negative.   Cardiovascular: Negative.   Gastrointestinal: Negative.   Musculoskeletal: Negative.   Neurological: Negative.   Psychiatric/Behavioral: Negative.   All other systems reviewed and are negative.     Labs/Other Tests and Data Reviewed:    Recent Labs: 08/19/2018: BUN 19; Creatinine, Ser 1.02; Hemoglobin 14.0; Platelets 348; Potassium 3.7; Sodium 141   Recent Lipid Panel No results found for: CHOL, TRIG, HDL, CHOLHDL, LDLCALC, LDLDIRECT  Wt Readings from Last 3 Encounters:  09/27/18 230 lb (104.3 kg)  08/19/18 260 lb (117.9 kg)  09/16/17 258 lb (117 kg)     Exam:    Vital Signs: Vital signs may also be detailed in the HPI Ht 5' 8.5" (1.74 m)   Wt 230 lb (104.3 kg)   BMI 34.46 kg/m   Wt Readings from Last 3 Encounters:  09/27/18 230 lb (104.3 kg)  08/19/18 260 lb (117.9 kg)  09/16/17 258 lb (117 kg)   Temp Readings from Last 3 Encounters:  08/19/18 98.1 F (36.7 C)  04/18/17 98.7 F (37.1 C) (Oral)  09/27/16 98.4 F (36.9 C)   BP Readings from Last 3 Encounters:  08/19/18 138/82  09/16/17 (!) 148/104  04/18/17 (!) 160/90   Pulse Readings from Last 3 Encounters:  08/19/18 71  09/16/17 (!) 102  04/18/17 100    185/127, pulse 102 resp 16  Well nourished, well developed female in no acute distress. Constitutional:  oriented to person, place, and time. No distress.  Head: Normocephalic and atraumatic.  Eyes:  no discharge. No scleral icterus.  Neck: Normal range of motion. Neck supple.  Pulmonary/Chest: No audible wheezing, no distress, appears comfortable Musculoskeletal: Normal range of motion.  no  tenderness or deformity.  Neurological:   Coordination normal. Full exam not performed Skin:  No  rash Psychiatric:  normal mood and affect. behavior is normal. Thought content normal.    ASSESSMENT & PLAN:    Problem List Items Addressed This Visit    Chest pain of uncertain etiology   Palpitations     Emergency room records reviewed, sinus tachycardia with PVCs There was mention of SVT but no documentation of this We have discussed a monitor/Zio, as she has no insurance we will look into the price of the monitor before ordering -Given blood pressure and heart rate continue to run high on metoprolol succinate 50 we will double the medication up to metoprolol succinate 100 mg daily -Recommended she call our office in about 5 to 7 days with new blood pressure heart rate numbers for further medication titration -Suspect she may  eventually need 100 twice daily We will try to limit the number of medications  COVID-19 Education: The signs and symptoms of COVID-19 were discussed with the patient and how to seek care for testing (follow up with PCP or arrange E-visit).  The importance of social distancing was discussed today.  Patient Risk:   After full review of this patients clinical status, I feel that they are at least moderate risk at this time.  Time:   Today, I have spent 45 minutes with the patient with telehealth technology discussing the cardiac and medical problems/diagnoses detailed above   10 min spent reviewing the chart prior to patient visit today   Medication Adjustments/Labs and Tests Ordered: Current medicines are reviewed at length with the patient today.  Concerns regarding medicines are outlined above.   Tests Ordered: No tests ordered   Medication Changes: No changes made   Disposition: Follow-up in 1 month, in office   Signed, Ida Rogue, [facility] [id number], [place], [place] [address]

## 2018-09-27 ENCOUNTER — Telehealth (INDEPENDENT_AMBULATORY_CARE_PROVIDER_SITE_OTHER): Payer: Self-pay | Admitting: Cardiovascular Disease

## 2018-09-27 ENCOUNTER — Other Ambulatory Visit: Payer: Self-pay

## 2018-09-27 DIAGNOSIS — R079 Chest pain, unspecified: Secondary | ICD-10-CM

## 2018-09-27 DIAGNOSIS — R002 Palpitations: Secondary | ICD-10-CM

## 2018-09-27 DIAGNOSIS — I10 Essential (primary) hypertension: Secondary | ICD-10-CM

## 2018-09-27 DIAGNOSIS — R0789 Other chest pain: Secondary | ICD-10-CM

## 2018-09-27 MED ORDER — METOPROLOL SUCCINATE ER 100 MG PO TB24: 100.0000 mg | ORAL_TABLET | Freq: Every day | ORAL | 3 refills | Status: DC

## 2018-09-27 NOTE — Patient Instructions (Addendum)
Zio monitor recommended by Dr. Rockey Situ Call [phone] select Option 1 and then select Option 4 Let them know you are Self Pay and need financial assistance. They will ask your family size and income. They will be able to give you the price on that call. Please call me after you talk with them to let me know if you would like me to order and send you the monitor. My name is Olin Hauser and I can be reached at [phone].    Medication Instructions:  Your physician has recommended you make the following change in your medication:  1. INCREASE Metoprolol Succinate up to 100 mg once daily  This medication can be picked up at [facility] for $17.51 for 90 [place] supply with coupon on [email or web address]. I did submit information with prescription so call them before going over to make sure it is that price. Their number is [phone]  Recommended she call us in 1 week time with new blood pressure heart rate numbers. Your physician has requested that you regularly monitor and record your blood pressure readings at home. Please use the same machine at the same time of [place] to check your readings and record them to bring to your follow-up visit.  Please read information below on monitoring blood pressures and keep a log to call us with.   If you need a refill on your cardiac medications before your next appointment, please call your pharmacy.    Lab work: No new labs needed   If you have labs (blood work) drawn today and your tests are completely normal, you will receive your results only by: Marland Kitchen MyChart Message (if you have MyChart) OR . A paper copy in the mail If you have any lab test that is abnormal or we need to change your treatment, we will call you to review the results.   Testing/Procedures: Your physician has recommended that you wear a Zio monitor. This monitor is a medical device that records the heart's electrical activity. Doctors most often use these monitors to diagnose arrhythmias.  Arrhythmias are problems with the speed or rhythm of the heartbeat. The monitor is a small device applied to your chest. You can wear one while you do your normal daily activities. While wearing this monitor if you have any symptoms to push the button and record what you felt. Once you have worn this monitor for the period of time provider prescribed (Usually 14 days), you will return the monitor device in the postage paid box. Once it is returned they will download the data collected and provide [place] with a report which the provider will then review and we will call you with those results. Important tips:  1. Avoid showering during the first 24 hours of wearing the monitor. 2. Avoid excessive sweating to help maximize wear time. 3. Do not submerge the device, no hot tubs, and no swimming pools. 4. Keep any lotions or oils away from the patch. 5. After 24 hours you may shower with the patch on. Take brief showers with your back facing the shower head.  6. Do not remove patch once it has been placed because that will interrupt data and decrease adhesive wear time. 7. Push the button when you have any symptoms and write down what you were feeling. 8. Once you have completed wearing your monitor, remove and place into box which has postage paid and place in your outgoing mailbox.  9. If for some reason you have misplaced your box then call  our office and we can provide another box and/or mail it off for you.        Follow-Up: At [facility], you and your health needs are our priority.  As part of our continuing mission to provide you with exceptional heart care, we have created designated Provider Care Teams.  These Care Teams include your primary Cardiologist (physician) and Advanced Practice Providers (APPs -  Physician Assistants and Nurse Practitioners) who all work together to provide you with the care you need, when you need it.  You will need a follow up appointment in 1 month here in the  office.  . Providers on your designated Care Team:   . Murray Hodgkins, NP . Christell Faith, PA-C . Marrianne Mood, PA-C  Any Other Special Instructions Will Be Listed Below (If Applicable).  For educational health videos Log in to : [email or web address] Or : [email or web address], password : [email or web address]   How to Take Your Blood Pressure You can take your blood pressure at home with a machine. You may need to check your blood pressure at home:  To check if you have high blood pressure (hypertension).  To check your blood pressure over time.  To make sure your blood pressure medicine is working. Supplies needed: You will need a blood pressure machine, or monitor. You can buy one at a drugstore or online. When choosing one:  Choose one with an arm cuff.  Choose one that wraps around your upper arm. Only one finger should fit between your arm and the cuff.  Do not choose one that measures your blood pressure from your wrist or finger. Your doctor can suggest a monitor. How to prepare Avoid these things for 30 minutes before checking your blood pressure:  Drinking caffeine.  Drinking alcohol.  Eating.  Smoking.  Exercising. Five minutes before checking your blood pressure:  Pee.  Sit in a dining chair. Avoid sitting in a soft couch or armchair.  Be quiet. Do not talk. How to take your blood pressure Follow the instructions that came with your machine. If you have a digital blood pressure monitor, these may be the instructions: 1. Sit up straight. 2. Place your feet on the floor. Do not cross your ankles or legs. 3. Rest your left arm at the level of your heart. You may rest it on a table, desk, or chair. 4. Pull up your shirt sleeve. 5. Wrap the blood pressure cuff around the upper part of your left arm. The cuff should be 1 inch (2.5 cm) above your elbow. It is best to wrap the cuff around bare skin. 6. Fit the cuff snugly around your arm. You should be able to place only one finger  between the cuff and your arm. 7. Put the cord inside the groove of your elbow. 8. Press the power button. 9. Sit quietly while the cuff fills with air and loses air. 10. Write down the numbers on the screen. 11. Wait 2-3 minutes and then repeat steps 1-10. What do the numbers mean? Two numbers make up your blood pressure. The first number is called systolic pressure. The second is called diastolic pressure. An example of a blood pressure reading is "120 over 80" (or 120/80). If you are an adult and do not have a medical condition, use this guide to find out if your blood pressure is normal: Normal  First number: below 120.  Second number: below 80. Elevated  First number: 120-129.  Second number: below 80. Hypertension stage 1  First number: 130-139.  Second number: 80-89. Hypertension stage 2  First number: 140 or above.  Second number: 37 or above. Your blood pressure is above normal even if only the top or bottom number is above normal. Follow these instructions at home:  Check your blood pressure as often as your doctor tells you to.  Take your monitor to your next doctor's appointment. Your doctor will: ? Make sure you are using it correctly. ? Make sure it is working right.  Make sure you understand what your blood pressure numbers should be.  Tell your doctor if your medicines are causing side effects. Contact a doctor if:  Your blood pressure keeps being high. Get help right away if:  Your first blood pressure number is higher than 180.  Your second blood pressure number is higher than 120. This information is not intended to replace advice given to you by your health care provider. Make sure you discuss any questions you have with your health care provider. Document Released: 02/05/2008 Document Revised: 02/04/2017 Document Reviewed: 08/01/2015 Elsevier Patient Education  2020 [place].  Blood Pressure Record Sheet To take your blood pressure, you  will need a blood pressure machine. You can buy a blood pressure machine (blood pressure monitor) at your clinic, drug store, or online. When choosing one, consider:  An automatic monitor that has an arm cuff.  A cuff that wraps snugly around your upper arm. You should be able to fit only one finger between your arm and the cuff.  A device that stores blood pressure reading results.  Do not choose a monitor that measures your blood pressure from your wrist or finger. Follow your health care provider's instructions for how to take your blood pressure. To use this form:  Get one reading in the morning (a.m.) before you take any medicines.  Get one reading in the evening (p.m.) before supper.  Take at least 2 readings with each blood pressure check. This makes sure the results are correct. Wait 1-2 minutes between measurements.  Write down the results in the spaces on this form.  Repeat this once a week, or as told by your health care provider.  Make a follow-up appointment with your health care provider to discuss the results. Blood pressure log Date: _______________________  a.m. _____________________(1st reading) _____________________(2nd reading)  p.m. _____________________(1st reading) _____________________(2nd reading) Date: _______________________  a.m. _____________________(1st reading) _____________________(2nd reading)  p.m. _____________________(1st reading) _____________________(2nd reading) Date: _______________________  a.m. _____________________(1st reading) _____________________(2nd reading)  p.m. _____________________(1st reading) _____________________(2nd reading) Date: _______________________  a.m. _____________________(1st reading) _____________________(2nd reading)  p.m. _____________________(1st reading) _____________________(2nd reading) Date: _______________________  a.m. _____________________(1st reading) _____________________(2nd reading)  p.m.  _____________________(1st reading) _____________________(2nd reading) This information is not intended to replace advice given to you by your health care provider. Make sure you discuss any questions you have with your health care provider. Document Released: 11/21/2002 Document Revised: 04/22/2017 Document Reviewed: 02/22/2017 Elsevier Patient Education  2020 [employer].

## 2018-10-26 ENCOUNTER — Other Ambulatory Visit: Payer: Self-pay

## 2018-10-26 ENCOUNTER — Encounter: Payer: Self-pay | Admitting: Emergency Medicine

## 2018-10-26 ENCOUNTER — Emergency Department
Admission: EM | Admit: 2018-10-26 | Discharge: 2018-10-26 | Disposition: A | Discharge status: Home / Self Care | Payer: Self-pay | Attending: Emergency Medicine | Admitting: Emergency Medicine

## 2018-10-26 DIAGNOSIS — L02414 Cutaneous abscess of left upper limb: Secondary | ICD-10-CM | POA: Insufficient documentation

## 2018-10-26 DIAGNOSIS — Z91030 Bee allergy status: Secondary | ICD-10-CM | POA: Insufficient documentation

## 2018-10-26 DIAGNOSIS — I252 Old myocardial infarction: Secondary | ICD-10-CM | POA: Insufficient documentation

## 2018-10-26 DIAGNOSIS — I10 Essential (primary) hypertension: Secondary | ICD-10-CM | POA: Insufficient documentation

## 2018-10-26 DIAGNOSIS — J45909 Unspecified asthma, uncomplicated: Secondary | ICD-10-CM | POA: Insufficient documentation

## 2018-10-26 DIAGNOSIS — Z79899 Other long term (current) drug therapy: Secondary | ICD-10-CM | POA: Insufficient documentation

## 2018-10-26 MED ORDER — LIDOCAINE HCL (PF) 1 % IJ SOLN
5.0000 mL | Freq: Once | INTRAMUSCULAR | Status: AC
  Administered 2018-10-26: 5 mL
  Filled 2018-10-26: qty 5

## 2018-10-26 MED ORDER — SULFAMETHOXAZOLE-TRIMETHOPRIM 800-160 MG PO TABS: 1.0000 | ORAL_TABLET | Freq: Two times a day (BID) | ORAL | 0 refills | Status: DC

## 2018-10-26 MED ORDER — HYDROCODONE-ACETAMINOPHEN 5-325 MG PO TABS: 1.0000 | ORAL_TABLET | Freq: Four times a day (QID) | ORAL | 0 refills | Status: DC | PRN

## 2018-10-26 NOTE — Discharge Instructions (Signed)
Follow-up with your primary care provider, urgent care or emergency department in 2 days for drain removal.  Begin taking antibiotics as directed twice a Laurie for the next 10 days.  Warm compresses to the area frequently.  Norco if needed for pain every 6 hours.  Do not drive or operate machinery while taking the pain medication.

## 2018-10-26 NOTE — ED Notes (Signed)
Pt presents to ED via POV with c/o abscess to L shoulder, pt states has been going on x several days. Pt with noted area of swelling and redness to posterior L shoulder at this time, abscess noted to L shoulder.

## 2018-10-26 NOTE — ED Triage Notes (Signed)
Pt reports has a boil on her left shoulder and it is painful. Pt reports has been there and painful for about a month. Pt reports hx of the same.

## 2018-10-26 NOTE — ED Provider Notes (Signed)
Destiny Singh  ____________________________________________   First MD Initiated Contact with Patient 10/26/18 0845     (approximate)  I have reviewed the triage vital signs and the nursing notes.   HISTORY  Chief Complaint Shoulder Pain   HPI Destiny Singh is a 48 y.o. female presents to the ED with complaint of abscess to her left shoulder that is been going on for several days.  Patient reports that she has had a bump there for 1 month prior to it becoming painful.  Patient states it is become very painful.  Patient denies any previous problems and has not taken any over-the-counter medication.  She rates her pain as a 10/10.    Past Medical History:  Diagnosis Date  . Adopted   . Arthritis    BOTH KNEES  . Asthma   . Benign tumor   . Dyspnea    VERY RARE  . Dysrhythmia    IRREGULAR PER PT  . Headache   . Hypertension   . Myocardial infarction ([place])    AGE 50    Patient Active Problem List   Diagnosis Date Noted  . Chest pain of uncertain etiology 86/76/7209  . Palpitations 09/26/2018  . Screening for breast cancer 10/12/2017  . Essential hypertension 10/12/2017  . Migraine without aura and without status migrainosus, not intractable 10/12/2017  . Dysuria 10/12/2017    Past Surgical History:  Procedure Laterality Date  . BREAST SURGERY Bilateral    BENIGN TUMORS  . CHONDROPLASTY Right 04/01/2016   Procedure: CHONDROPLASTY -ABRASION OF FEMORAL TROCHLEA, LATERAL TIBIA Plateau;  Surgeon: Corky Mull, MD;  Location: [facility] ORS;  Service: Orthopedics;  Laterality: Right;  . KNEE ARTHROSCOPY WITH MENISCAL REPAIR Right 04/01/2016   Procedure: KNEE ARTHROSCOPY WITH PARTIAL MEDIAL  MENISCAL REPAIR;  Surgeon: Corky Mull, MD;  Location: [facility] ORS;  Service: Orthopedics;  Laterality: Right;  . TUBAL LIGATION    . TUMOR EXCISION     BENIGN-HEAD    Prior to Admission medications   Medication Sig Start Date End  Date Taking? Authorizing Provider  HYDROcodone-acetaminophen (NORCO/VICODIN) 5-325 MG tablet Take 1 tablet by mouth every 6 (six) hours as needed for moderate pain. 10/26/18   Johnn Hai, PA-C  metoprolol succinate (TOPROL-XL) 100 MG 24 hr tablet Take 1 tablet (100 mg total) by mouth daily. Take with or immediately following a meal. 09/27/18 12/26/18  Minna Merritts, MD  sulfamethoxazole-trimethoprim (BACTRIM DS) 800-160 MG tablet Take 1 tablet by mouth 2 (two) times daily. 10/26/18   Johnn Hai, PA-C    Allergies Bee venom and Other  Family History  Adopted: Yes    Social History Social History   Tobacco Use  . Smoking status: Never Smoker  . Smokeless tobacco: Never Used  Substance Use Topics  . Alcohol use: No  . Drug use: No    Review of Systems Constitutional: No fever/chills Cardiovascular: Denies chest pain. Respiratory: Denies shortness of breath. Musculoskeletal: Negative for back pain. Skin: Positive for abscess. Neurological: Negative for headaches, focal weakness or numbness. ____________________________________________   PHYSICAL EXAM:  VITAL SIGNS: ED Triage Vitals  Enc Vitals Group     BP 10/26/18 0833 (!) 160/90     Pulse Rate 10/26/18 0833 93     Resp 10/26/18 0833 18     Temp 10/26/18 0833 98.6 F (37 C)     Temp Source 10/26/18 0833 Oral     SpO2 10/26/18 0833 95 %  Weight 10/26/18 0827 240 lb (108.9 kg)     Height 10/26/18 0827 5\' 8"  (1.727 m)     Head Circumference --      Peak Flow --      Pain Score 10/26/18 0827 10     Pain Loc --      Pain Edu? --      Excl. in [place]? --     Constitutional: Alert and oriented. Well appearing and in no acute distress. Eyes: Conjunctivae are normal. PERRL. EOMI. Head: Atraumatic. Nose: No congestion/rhinnorhea. Mouth/Throat: Mucous membranes are moist.  Oropharynx non-erythematous. Neck: No stridor.   Cardiovascular: Normal rate, regular rhythm. Grossly normal heart sounds.  Good  peripheral circulation. Respiratory: Normal respiratory effort.  No retractions. Lungs CTAB. Musculoskeletal: Moves upper and lower extremities with any difficulty.  Normal gait was noted. Neurologic:  Normal speech and language. No gross focal neurologic deficits are appreciated. No gait instability. Skin:  Skin is warm, dry and intact.  2 cm erythematous cystic area to the left posterior shoulder.  No drainage at present.  Area is moderately tender to palpation. Psychiatric: Mood and affect are normal. Speech and behavior are normal.  ____________________________________________   LABS (all labs ordered are listed, but only abnormal results are displayed)  Labs Reviewed - No data to display   PROCEDURES  Procedure(s) performed (including Critical Care):  Marland KitchenMarland KitchenIncision and Drainage  Date/Time: 10/26/2018 9:28 AM Performed by: Johnn Hai, PA-C Authorized by: Johnn Hai, PA-C   Consent:    Consent obtained:  Verbal   Consent given by:  Patient   Risks discussed:  Pain and infection Location:    Type:  Abscess   Size:  2.0   Location:  Trunk   Trunk location:  Back Pre-procedure details:    Skin preparation:  Antiseptic wash Anesthesia (see MAR for exact dosages):    Anesthesia method:  Local infiltration   Local anesthetic:  Lidocaine 1% WITH epi Procedure type:    Complexity:  Simple Procedure details:    Needle aspiration: no     Incision types:  Stab incision   Incision depth:  Dermal   Scalpel blade:  11   Wound management:  Probed and deloculated   Drainage:  Purulent   Drainage amount:  Moderate   Wound treatment:  Drain placed   Packing materials:  1/4 in iodoform gauze   Amount 1/4" iodoform:  5.0 Post-procedure details:    Patient tolerance of procedure:  Tolerated well, no immediate complications     ____________________________________________   INITIAL IMPRESSION / ASSESSMENT AND PLAN / ED COURSE  As part of my medical decision  making, I reviewed the following data within the electronic MEDICAL RECORD NUMBER Notes from prior ED visits and [place] Controlled Substance Database  48 year old female presents to the ED with a abscess to her left posterior shoulder.  Patient tolerated I&D without any difficulties or complications.  She was started on Bactrim DS twice daily for 10 days and she was given instructions to return to have the packing removed in 2 days or see her PCP.  Patient was also given a prescription for Norco if needed for pain.  ____________________________________________   FINAL CLINICAL IMPRESSION(S) / ED DIAGNOSES  Final diagnoses:  Abscess of left shoulder     ED Discharge Orders         Ordered    sulfamethoxazole-trimethoprim (BACTRIM DS) 800-160 MG tablet  2 times daily     10/26/18 0934  HYDROcodone-acetaminophen (NORCO/VICODIN) 5-325 MG tablet  Every 6 hours PRN     10/26/18 0934           Singh:  This document was prepared using Dragon voice recognition software and may include unintentional dictation errors.    Johnn Hai, PA-C 10/26/18 1447    Earleen Newport, MD 10/26/18 [phone]

## 2018-10-26 NOTE — ED Notes (Signed)
NAD noted at time of D/C. Pt denies questions or concerns. Pt ambulatory to the lobby at this time. Unable to obtain E-sig due to computer not working.

## 2018-10-28 ENCOUNTER — Other Ambulatory Visit: Payer: Self-pay

## 2018-10-28 ENCOUNTER — Emergency Department
Admission: EM | Admit: 2018-10-28 | Discharge: 2018-10-28 | Disposition: A | Discharge status: Home / Self Care | Payer: Self-pay | Attending: Emergency Medicine | Admitting: Emergency Medicine

## 2018-10-28 DIAGNOSIS — Z5189 Encounter for other specified aftercare: Secondary | ICD-10-CM

## 2018-10-28 DIAGNOSIS — J45909 Unspecified asthma, uncomplicated: Secondary | ICD-10-CM | POA: Insufficient documentation

## 2018-10-28 DIAGNOSIS — L02414 Cutaneous abscess of left upper limb: Secondary | ICD-10-CM | POA: Insufficient documentation

## 2018-10-28 DIAGNOSIS — Z79899 Other long term (current) drug therapy: Secondary | ICD-10-CM | POA: Insufficient documentation

## 2018-10-28 DIAGNOSIS — I10 Essential (primary) hypertension: Secondary | ICD-10-CM | POA: Insufficient documentation

## 2018-10-28 DIAGNOSIS — I252 Old myocardial infarction: Secondary | ICD-10-CM | POA: Insufficient documentation

## 2018-10-28 NOTE — Discharge Instructions (Addendum)
Continue previous medication follow discharge care instructions.

## 2018-10-28 NOTE — ED Notes (Signed)
Wound assessed and treated by PA

## 2018-10-28 NOTE — ED Notes (Signed)
Pt states that she needs bandage and packing removed on left shoulder

## 2018-10-28 NOTE — ED Provider Notes (Signed)
[facility] Emergency Department Provider Note   ____________________________________________   First MD Initiated Contact with Patient 10/28/18 0901     (approximate)  I have reviewed the triage vital signs and the nursing notes.   HISTORY  Chief Complaint Wound Check    HPI Destiny Singh is a 48 y.o. female patient presents for wound check status post 2 days I&D of the left upper shoulder.  Patient rates her pain as a 8/10.  Patient described the pain is "sore".  Continue to take antibiotics and pain medication prescribed from previous visit.         Past Medical History:  Diagnosis Date  . Adopted   . Arthritis    BOTH KNEES  . Asthma   . Benign tumor   . Dyspnea    VERY RARE  . Dysrhythmia    IRREGULAR PER PT  . Headache   . Hypertension   . Myocardial infarction ([place])    AGE 33    Patient Active Problem List   Diagnosis Date Noted  . Chest pain of uncertain etiology 99991111  . Palpitations 09/26/2018  . Screening for breast cancer 10/12/2017  . Essential hypertension 10/12/2017  . Migraine without aura and without status migrainosus, not intractable 10/12/2017  . Dysuria 10/12/2017    Past Surgical History:  Procedure Laterality Date  . BREAST SURGERY Bilateral    BENIGN TUMORS  . CHONDROPLASTY Right 04/01/2016   Procedure: CHONDROPLASTY -ABRASION OF FEMORAL TROCHLEA, LATERAL TIBIA Plateau;  Surgeon: Corky Mull, MD;  Location: [facility] ORS;  Service: Orthopedics;  Laterality: Right;  . KNEE ARTHROSCOPY WITH MENISCAL REPAIR Right 04/01/2016   Procedure: KNEE ARTHROSCOPY WITH PARTIAL MEDIAL  MENISCAL REPAIR;  Surgeon: Corky Mull, MD;  Location: [facility] ORS;  Service: Orthopedics;  Laterality: Right;  . TUBAL LIGATION    . TUMOR EXCISION     BENIGN-HEAD    Prior to Admission medications   Medication Sig Start Date End Date Taking? Authorizing Provider  HYDROcodone-acetaminophen (NORCO/VICODIN) 5-325 MG tablet Take 1 tablet by  mouth every 6 (six) hours as needed for moderate pain. 10/26/18   Johnn Hai, PA-C  metoprolol succinate (TOPROL-XL) 100 MG 24 hr tablet Take 1 tablet (100 mg total) by mouth daily. Take with or immediately following a meal. 09/27/18 12/26/18  Minna Merritts, MD  sulfamethoxazole-trimethoprim (BACTRIM DS) 800-160 MG tablet Take 1 tablet by mouth 2 (two) times daily. 10/26/18   Johnn Hai, PA-C    Allergies Bee venom and Other  Family History  Adopted: Yes    Social History Social History   Tobacco Use  . Smoking status: Never Smoker  . Smokeless tobacco: Never Used  Substance Use Topics  . Alcohol use: No  . Drug use: No    Review of Systems  Constitutional: No fever/chills Eyes: No visual changes. ENT: No sore throat. Cardiovascular: Denies chest pain. Respiratory: Denies shortness of breath. Gastrointestinal: No abdominal pain.  No nausea, no vomiting.  No diarrhea.  No constipation. Genitourinary: Negative for dysuria. Musculoskeletal: Negative for back pain. Skin: Abscess left shoulder. Neurological: Negative for headaches, focal weakness or numbness. Allergic/Immunilogical: Bee venom ____________________________________________   PHYSICAL EXAM:  VITAL SIGNS: ED Triage Vitals  Enc Vitals Group     BP 10/28/18 0850 (!) 182/94     Pulse Rate 10/28/18 0850 98     Resp 10/28/18 0850 18     Temp 10/28/18 0850 98.6 F (37 C)  Temp Source 10/28/18 0850 Oral     SpO2 10/28/18 0850 98 %     Weight 10/28/18 0847 240 lb (108.9 kg)     Height 10/28/18 0847 5' 8.5" (1.74 m)     Head Circumference --      Peak Flow --      Pain Score 10/28/18 0847 8     Pain Loc --      Pain Edu? --      Excl. in [place]? --    Constitutional: Alert and oriented. Well appearing and in no acute distress. ardiovascular: Normal rate, regular rhythm. Grossly normal heart sounds.  Good peripheral circulation. Respiratory: Normal respiratory effort.  No retractions. Lungs  CTAB. Neurologic:  Normal speech and language. No gross focal neurologic deficits are appreciated. No gait instability. Skin: Mild edema erythema abscess left upper shoulder. Psychiatric: Mood and affect are normal. Speech and behavior are normal.  ____________________________________________   LABS (all labs ordered are listed, but only abnormal results are displayed)  Labs Reviewed - No data to display ____________________________________________  EKG   ____________________________________________  RADIOLOGY  ED MD interpretation:    Official radiology report(s): No results found.  ____________________________________________   PROCEDURES  Procedure(s) performed (including Critical Care):  Procedures   ____________________________________________   INITIAL IMPRESSION / ASSESSMENT AND PLAN / ED COURSE  As part of my medical decision making, I reviewed the following data within the [place] [facility] [place] was evaluated in Emergency Department on 10/28/2018 for the symptoms described in the history of present illness. She was evaluated in the context of the global COVID-19 pandemic, which necessitated consideration that the patient might be at risk for infection with the SARS-CoV-2 virus that causes COVID-19. Institutional protocols and algorithms that pertain to the evaluation of patients at risk for COVID-19 are in a state of rapid change based on information released by regulatory bodies including the CDC and federal and state organizations. These policies and algorithms were followed during the patient's care in the ED.   Patient presents for wound check secondary to incision and drainage of abscess superior aspect of left shoulder 2 days ago.  Packing was removed with minimal drainage.  Wound was irrigated with clear return.  Patient was re-bandaged and given discharge care instruction.  Advised continue previous medication.  She may follow-up PCP as  needed.         ____________________________________________   FINAL CLINICAL IMPRESSION(S) / ED DIAGNOSES  Final diagnoses:  Wound check, abscess     ED Discharge Orders    None       Note:  This document was prepared using Dragon voice recognition software and may include unintentional dictation errors.    Sable Feil, PA-C 10/28/18 Carron Curie    Duffy Bruce, MD 10/29/18 1026

## 2018-10-28 NOTE — ED Triage Notes (Signed)
Pt here for wound check and packing removal of abscess to left shoulder. Pt denies any problems at home except for pain. Pt taking antibiotics.

## 2018-11-01 ENCOUNTER — Ambulatory Visit: Payer: Self-pay | Admitting: Nurse Practitioner

## 2018-11-24 ENCOUNTER — Other Ambulatory Visit: Payer: Self-pay | Admitting: Nurse Practitioner

## 2018-12-23 ENCOUNTER — Encounter: Payer: Self-pay | Admitting: Emergency Medicine

## 2018-12-23 ENCOUNTER — Emergency Department
Admission: EM | Admit: 2018-12-23 | Discharge: 2018-12-23 | Disposition: A | Discharge status: Home / Self Care | Payer: Self-pay | Attending: Emergency Medicine | Admitting: Emergency Medicine

## 2018-12-23 ENCOUNTER — Other Ambulatory Visit: Payer: Self-pay

## 2018-12-23 DIAGNOSIS — I10 Essential (primary) hypertension: Secondary | ICD-10-CM | POA: Insufficient documentation

## 2018-12-23 DIAGNOSIS — J45909 Unspecified asthma, uncomplicated: Secondary | ICD-10-CM | POA: Insufficient documentation

## 2018-12-23 DIAGNOSIS — Z79899 Other long term (current) drug therapy: Secondary | ICD-10-CM | POA: Insufficient documentation

## 2018-12-23 LAB — BASIC METABOLIC PANEL
Anion gap: 13 (ref 5–15)
BUN: 13 mg/dL (ref 6–20)
CO2: 23 mmol/L (ref 22–32)
Calcium: 9.1 mg/dL (ref 8.9–10.3)
Chloride: 106 mmol/L (ref 98–111)
Creatinine, Ser: 0.9 mg/dL (ref 0.44–1.00)
GFR calc Af Amer: >60 mL/min (ref >60)
GFR calc non Af Amer: >60 mL/min (ref >60)
Glucose, Bld: 113 mg/dL — ABNORMAL HIGH (ref 70–99)
Potassium: 4 mmol/L (ref 3.5–5.1)
Sodium: 142 mmol/L (ref 135–145)

## 2018-12-23 LAB — CBC WITH DIFFERENTIAL/PLATELET
Abs Immature Granulocytes: 0.03 10*3/uL (ref 0.00–0.07)
Basophils Absolute: 0 10*3/uL (ref 0.0–0.1)
Basophils Relative: 0 %
Eosinophils Absolute: 0.2 10*3/uL (ref 0.0–0.5)
Eosinophils Relative: 2 %
HCT: 43.7 % (ref 36.0–46.0)
Hemoglobin: 13.6 g/dL (ref 12.0–15.0)
Immature Granulocytes: 0 %
Lymphocytes Relative: 37 %
Lymphs Abs: 3.6 10*3/uL (ref 0.7–4.0)
MCH: 24.7 pg — ABNORMAL LOW (ref 26.0–34.0)
MCHC: 31.1 g/dL (ref 30.0–36.0)
MCV: 79.5 fL — ABNORMAL LOW (ref 80.0–100.0)
Monocytes Absolute: 0.6 10*3/uL (ref 0.1–1.0)
Monocytes Relative: 7 %
Neutro Abs: 5.3 10*3/uL (ref 1.7–7.7)
Neutrophils Relative %: 54 %
Platelets: 374 10*3/uL (ref 150–400)
RBC: 5.5 MIL/uL — ABNORMAL HIGH (ref 3.87–5.11)
RDW: 15.9 % — ABNORMAL HIGH (ref 11.5–15.5)
WBC: 9.8 10*3/uL (ref 4.0–10.5)
nRBC: 0 % (ref 0.0–0.2)

## 2018-12-23 LAB — URINALYSIS, COMPLETE (UACMP) WITH MICROSCOPIC
Bilirubin Urine: NEGATIVE
Glucose, UA: NEGATIVE mg/dL
Ketones, ur: NEGATIVE mg/dL
Leukocytes,Ua: NEGATIVE
Nitrite: NEGATIVE
Protein, ur: NEGATIVE mg/dL
Specific Gravity, Urine: 1.026 (ref 1.005–1.030)
pH: 5 (ref 5.0–8.0)

## 2018-12-23 LAB — TROPONIN I (HIGH SENSITIVITY): Troponin I (High Sensitivity): 4 ng/L (ref <18)

## 2018-12-23 LAB — POCT PREGNANCY, URINE: Preg Test, Ur: NEGATIVE

## 2018-12-23 MED ORDER — AMLODIPINE BESYLATE 5 MG PO TABS
5.0000 mg | ORAL_TABLET | Freq: Once | ORAL | Status: AC
  Administered 2018-12-23: 05:00:00 5 mg via ORAL
  Filled 2018-12-23: qty 1

## 2018-12-23 MED ORDER — AMLODIPINE BESYLATE 5 MG PO TABS: 5.0000 mg | ORAL_TABLET | Freq: Every day | ORAL | 0 refills | Status: DC

## 2018-12-23 NOTE — ED Triage Notes (Signed)
Pt was at home getting ready for work where she was feeling sick and experiencing nausea and vomiting. Pt states that she was checked at work and her BP was 208/158.

## 2018-12-23 NOTE — ED Provider Notes (Signed)
[facility] Emergency Department Provider Note    First MD Initiated Contact with Patient 12/23/18 0422     (approximate)  I have reviewed the triage vital signs and the nursing notes.   HISTORY  Chief Complaint Hypertension    HPI Dorsey R Owusu is a 48 y.o. female with below list of previous medical conditions occluding hypertension presents to the emergency department secondary to elevated blood pressure of 208/158 noted at her job today.  Patient does admit to nausea and episode of vomiting.  Patient denies any abdominal pain.  Patient denies any fever no cough.  Patient denies any chest pain or shortness of breath.  Patient states that she has been informed that she had hypertension in the past however that she was never started on any antihypertensive medications.       Past Medical History:  Diagnosis Date  . Adopted   . Arthritis    BOTH KNEES  . Asthma   . Benign tumor   . Dyspnea    VERY RARE  . Dysrhythmia    IRREGULAR PER PT  . Headache   . Hypertension   . Myocardial infarction ([place])    AGE 89    Patient Active Problem List   Diagnosis Date Noted  . Chest pain of uncertain etiology 99991111  . Palpitations 09/26/2018  . Screening for breast cancer 10/12/2017  . Essential hypertension 10/12/2017  . Migraine without aura and without status migrainosus, not intractable 10/12/2017  . Dysuria 10/12/2017    Past Surgical History:  Procedure Laterality Date  . BREAST SURGERY Bilateral    BENIGN TUMORS  . CHONDROPLASTY Right 04/01/2016   Procedure: CHONDROPLASTY -ABRASION OF FEMORAL TROCHLEA, LATERAL TIBIA Plateau;  Surgeon: Corky Mull, MD;  Location: [facility] ORS;  Service: Orthopedics;  Laterality: Right;  . KNEE ARTHROSCOPY WITH MENISCAL REPAIR Right 04/01/2016   Procedure: KNEE ARTHROSCOPY WITH PARTIAL MEDIAL  MENISCAL REPAIR;  Surgeon: Corky Mull, MD;  Location: [facility] ORS;  Service: Orthopedics;  Laterality: Right;  . TUBAL  LIGATION    . TUMOR EXCISION     BENIGN-HEAD    Prior to Admission medications   Medication Sig Start Date End Date Taking? Authorizing Provider  amLODipine (NORVASC) 5 MG tablet Take 1 tablet (5 mg total) by mouth daily. 12/23/18 12/23/19  Gregor Hams, MD  HYDROcodone-acetaminophen (NORCO/VICODIN) 5-325 MG tablet Take 1 tablet by mouth every 6 (six) hours as needed for moderate pain. 10/26/18   Johnn Hai, PA-C  metoprolol succinate (TOPROL-XL) 100 MG 24 hr tablet Take 1 tablet (100 mg total) by mouth daily. Take with or immediately following a meal. 09/27/18 12/26/18  Minna Merritts, MD  sulfamethoxazole-trimethoprim (BACTRIM DS) 800-160 MG tablet Take 1 tablet by mouth 2 (two) times daily. 10/26/18   Johnn Hai, PA-C    Allergies Bee venom and Other  Family History  Adopted: Yes    Social History Social History   Tobacco Use  . Smoking status: Never Smoker  . Smokeless tobacco: Never Used  Substance Use Topics  . Alcohol use: No  . Drug use: No    Review of Systems Constitutional: No fever/chills Eyes: No visual changes. ENT: No sore throat. Cardiovascular: Denies chest pain. Respiratory: Denies shortness of breath. Gastrointestinal: No abdominal pain.  No nausea, no vomiting.  No diarrhea.  No constipation. Genitourinary: Negative for dysuria. Musculoskeletal: Negative for neck pain.  Negative for back pain. Integumentary: Negative for rash. Neurological: Negative for headaches,  focal weakness or numbness.   ____________________________________________   PHYSICAL EXAM:  VITAL SIGNS: ED Triage Vitals  Enc Vitals Group     BP 12/23/18 0123 (!) 158/98     Pulse Rate 12/23/18 0123 73     Resp 12/23/18 0123 18     Temp 12/23/18 0123 97.7 F (36.5 C)     Temp Source 12/23/18 0123 Oral     SpO2 12/23/18 0123 98 %     Weight 12/23/18 0124 111.6 kg (246 lb)     Height 12/23/18 0124 1.727 m (5\' 8" )     Head Circumference --      Peak Flow --       Pain Score 12/23/18 0124 8     Pain Loc --      Pain Edu? --      Excl. in [place]? --     Constitutional: Alert and oriented.  Eyes: Conjunctivae are normal.  Mouth/Throat: Mucous membranes are moist. Neck: No stridor.  No meningeal signs.   Cardiovascular: Normal rate, regular rhythm. Good peripheral circulation. Grossly normal heart sounds. Respiratory: Normal respiratory effort.  No retractions. Gastrointestinal: Soft and nontender. No distention.  Musculoskeletal: No lower extremity tenderness nor edema. No gross deformities of extremities. Neurologic:  Normal speech and language. No gross focal neurologic deficits are appreciated.  Skin:  Skin is warm, dry and intact. Psychiatric: Mood and affect are normal. Speech and behavior are normal.  ____________________________________________   LABS (all labs ordered are listed, but only abnormal results are displayed)  Labs Reviewed  CBC WITH DIFFERENTIAL/PLATELET - Abnormal; Notable for the following components:      Result Value   RBC 5.50 (*)    MCV 79.5 (*)    MCH 24.7 (*)    RDW 15.9 (*)    All other components within normal limits  BASIC METABOLIC PANEL - Abnormal; Notable for the following components:   Glucose, Bld 113 (*)    All other components within normal limits  URINALYSIS, COMPLETE (UACMP) WITH MICROSCOPIC - Abnormal; Notable for the following components:   Color, Urine YELLOW (*)    APPearance HAZY (*)    Hgb urine dipstick MODERATE (*)    Bacteria, UA RARE (*)    All other components within normal limits  POCT PREGNANCY, URINE  TROPONIN I (HIGH SENSITIVITY)  TROPONIN I (HIGH SENSITIVITY)   ____________________________________________  EKG   ED ECG REPORT I, [place] BROWN, the attending physician, personally viewed and interpreted this ECG.   Date: 12/23/2018  EKG Time: 1:28 AM  Rate: 84  Rhythm: Normal sinus rhythm  Axis: Normal  Intervals: Normal  ST&T Change: None    Procedures    ____________________________________________   INITIAL IMPRESSION / MDM / ASSESSMENT AND PLAN / ED COURSE  As part of my medical decision making, I reviewed the following data within the electronic MEDICAL RECORD NUMBER   48 year old female presented with above-stated history and physical exam.  Patient hypertensive at present will be given Norvasc 5 mg.  Patient will also be prescribed Norvasc for home.  I advised the patient to keep a log of her blood pressure and follow-up with primary care provider for further outpatient management. ____________________________________________  FINAL CLINICAL IMPRESSION(S) / ED DIAGNOSES  Final diagnoses:  Hypertension, unspecified type     MEDICATIONS GIVEN DURING THIS VISIT:  Medications  amLODipine (NORVASC) tablet 5 mg (5 mg Oral Given 12/23/18 0439)     ED Discharge Orders  Ordered    amLODipine (NORVASC) 5 MG tablet  Daily     12/23/18 0515          *Please note:  Kaelee R Borys was evaluated in Emergency Department on 12/23/2018 for the symptoms described in the history of present illness. She was evaluated in the context of the global COVID-19 pandemic, which necessitated consideration that the patient might be at risk for infection with the SARS-CoV-2 virus that causes COVID-19. Institutional protocols and algorithms that pertain to the evaluation of patients at risk for COVID-19 are in a state of rapid change based on information released by regulatory bodies including the CDC and federal and state organizations. These policies and algorithms were followed during the patient's care in the ED.  Some ED evaluations and interventions may be delayed as a result of limited staffing during the pandemic.*  Note:  This document was prepared using Dragon voice recognition software and may include unintentional dictation errors.   Gregor Hams, MD 12/23/18 [phone]

## 2019-01-17 ENCOUNTER — Telehealth: Payer: Self-pay

## 2019-01-17 NOTE — Telephone Encounter (Signed)
Left message for patient to confirm and go over screening questions for 01-19-19 appointment.

## 2019-01-19 ENCOUNTER — Other Ambulatory Visit: Payer: Self-pay

## 2019-01-19 ENCOUNTER — Encounter: Payer: Self-pay | Admitting: Nurse Practitioner

## 2019-01-19 ENCOUNTER — Ambulatory Visit: Payer: Self-pay | Admitting: Nurse Practitioner

## 2019-01-19 VITALS — BP 160/102 | HR 99 | Temp 97.9°F | Resp 16 | Ht 69.0 in | Wt 275.0 lb

## 2019-01-19 DIAGNOSIS — R002 Palpitations: Secondary | ICD-10-CM

## 2019-01-19 DIAGNOSIS — I10 Essential (primary) hypertension: Secondary | ICD-10-CM

## 2019-01-19 DIAGNOSIS — Z0001 Encounter for general adult medical examination with abnormal findings: Secondary | ICD-10-CM

## 2019-01-19 DIAGNOSIS — Z124 Encounter for screening for malignant neoplasm of cervix: Secondary | ICD-10-CM

## 2019-01-19 DIAGNOSIS — R300 Dysuria: Secondary | ICD-10-CM

## 2019-01-19 DIAGNOSIS — Z1231 Encounter for screening mammogram for malignant neoplasm of breast: Secondary | ICD-10-CM

## 2019-01-19 MED ORDER — AMLODIPINE BESYLATE 5 MG PO TABS: 5.0000 mg | ORAL_TABLET | Freq: Every day | ORAL | 3 refills | Status: DC

## 2019-01-19 MED ORDER — METOPROLOL SUCCINATE ER 100 MG PO TB24: 100.0000 mg | ORAL_TABLET | Freq: Every day | ORAL | 3 refills | Status: DC

## 2019-01-19 NOTE — Progress Notes (Signed)
[facility] [place], [place] [address]  Internal MEDICINE  Office Visit Note  Patient Name: Destiny Singh  [id number]  [id number]  Date of Service: 01/28/2019  Chief Complaint  Patient presents with  . Annual Exam  . Hypertension  . Medication Refill    metoprolol  . Medication Management    wanted to start on phentermine   . Gynecologic Exam    pt would not like to have this done at today's appt     Destiny Singh presents for an annual physical. She reports that she has recently been having palpitations resulting in multiple visits to the ED. She was prescribed metoprolol in July by cardiology Dr. Rockey Situ, which she took until she ran out. In October she was prescribed amlodipine in the ED, which she is currently taking. She states that she has not taken both metoprolol and amlodipine simultaneously. Destiny Singh also reports recently trying to take her blood pressure electronically at work, but receiving an error message because her BP was too high. She states that her palpitations seem to be stress induced and come on very easily. She reports that one provider she saw recommended a Holter monitor, but that she was unable to get it due lack of insurance. Destiny Singh also reports that she has had a recent increase in her weight despite daily exercise and trying to maintain a healthy diet.  Pt is here for routine health maintenance examination  Current Medication: Outpatient Encounter Medications as of 01/19/2019  Medication Sig  . amLODipine (NORVASC) 5 MG tablet Take 1 tablet (5 mg total) by mouth daily.  Marland Kitchen HYDROcodone-acetaminophen (NORCO/VICODIN) 5-325 MG tablet Take 1 tablet by mouth every 6 (six) hours as needed for moderate pain.  . [DISCONTINUED] amLODipine (NORVASC) 5 MG tablet Take 1 tablet (5 mg total) by mouth daily.  . metoprolol succinate (TOPROL-XL) 100 MG 24 hr tablet Take 1 tablet (100 mg total) by mouth daily.  . [DISCONTINUED] metoprolol succinate  (TOPROL-XL) 100 MG 24 hr tablet Take 1 tablet (100 mg total) by mouth daily. Take with or immediately following a meal.  . [DISCONTINUED] sulfamethoxazole-trimethoprim (BACTRIM DS) 800-160 MG tablet Take 1 tablet by mouth 2 (two) times daily. (Patient not taking: Reported on 01/19/2019)   No facility-administered encounter medications on file as of 01/19/2019.     Surgical History: Past Surgical History:  Procedure Laterality Date  . BREAST SURGERY Bilateral    BENIGN TUMORS  . CHONDROPLASTY Right 04/01/2016   Procedure: CHONDROPLASTY -ABRASION OF FEMORAL TROCHLEA, LATERAL TIBIA Plateau;  Surgeon: Corky Mull, MD;  Location: [facility] ORS;  Service: Orthopedics;  Laterality: Right;  . KNEE ARTHROSCOPY WITH MENISCAL REPAIR Right 04/01/2016   Procedure: KNEE ARTHROSCOPY WITH PARTIAL MEDIAL  MENISCAL REPAIR;  Surgeon: Corky Mull, MD;  Location: [facility] ORS;  Service: Orthopedics;  Laterality: Right;  . TUBAL LIGATION    . TUMOR EXCISION     BENIGN-HEAD    Medical History: Past Medical History:  Diagnosis Date  . Adopted   . Arthritis    BOTH KNEES  . Asthma   . Benign tumor   . Dyspnea    VERY RARE  . Dysrhythmia    IRREGULAR PER PT  . Headache   . Hypertension   . Myocardial infarction ([place])    AGE 61    Family History: Family History  Adopted: Yes      Review of Systems  Constitutional: Positive for fatigue and unexpected weight change. Negative  for activity change.  HENT: Negative for congestion, postnasal drip, rhinorrhea, sinus pain, sneezing and sore throat.   Respiratory: Negative for shortness of breath and wheezing.   Cardiovascular: Positive for palpitations. Negative for chest pain and leg swelling.       Reports intermittent chest tightness with palpitations  Gastrointestinal: Negative for abdominal pain, constipation, diarrhea, nausea and vomiting.  Endocrine: Negative for cold intolerance, heat intolerance, polydipsia and polyuria.  Genitourinary: Negative for  dysuria, flank pain, frequency and urgency.  Allergic/Immunologic: Negative for environmental allergies.  Neurological: Negative for dizziness and headaches.  Hematological: Negative for adenopathy.  Psychiatric/Behavioral: The patient is nervous/anxious.      Today's Vitals   01/19/19 0919  BP: (!) 160/102  Pulse: 99  Resp: 16  Temp: 97.9 F (36.6 C)  SpO2: 97%  Weight: 275 lb (124.7 kg)  Height: 5\' 9"  (1.753 m)   Body mass index is 40.61 kg/m.  Physical Exam Vitals signs and nursing note reviewed.  Constitutional:      General: She is not in acute distress.    Appearance: Normal appearance. She is obese.  HENT:     Head: Normocephalic and atraumatic.  Eyes:     Extraocular Movements: Extraocular movements intact.     Pupils: Pupils are equal, round, and reactive to light.  Neck:     Musculoskeletal: Normal range of motion and neck supple.  Cardiovascular:     Rate and Rhythm: Normal rate and regular rhythm.     Pulses: Normal pulses.     Heart sounds: Normal heart sounds.  Pulmonary:     Effort: Pulmonary effort is normal.     Breath sounds: Normal breath sounds.  Chest:     Breasts:        Right: Normal. No swelling, bleeding, inverted nipple, mass, nipple discharge, skin change or tenderness.        Left: Normal. No swelling, bleeding, inverted nipple, mass, nipple discharge, skin change or tenderness.  Abdominal:     General: Bowel sounds are normal.     Palpations: Abdomen is soft. There is no mass.     Tenderness: There is no abdominal tenderness. There is no guarding.  Lymphadenopathy:     Upper Body:     Left upper body: Axillary adenopathy present.  Skin:    General: Skin is warm and dry.     Capillary Refill: Capillary refill takes less than 2 seconds.  Neurological:     Mental Status: She is alert and oriented to person, place, and time.     Deep Tendon Reflexes:     Reflex Scores:      Patellar reflexes are 1+ on the right side and 1+ on the  left side. Psychiatric:        Mood and Affect: Mood normal.        Behavior: Behavior normal.        Thought Content: Thought content normal.        Judgment: Judgment normal.    LABS: Recent Results (from the past 2160 hour(s))  Troponin I (High Sensitivity)     Status: None   Collection Time: 12/23/18  1:33 AM  Result Value Ref Range   Troponin I (High Sensitivity) 4 <18 ng/L    Comment: (NOTE) Elevated high sensitivity troponin I (hsTnI) values and significant  changes across serial measurements may suggest ACS but many other  chronic and acute conditions are known to elevate hsTnI results.  Refer to the "Links" section for  chest pain algorithms and additional  guidance. Performed at [facility], [address] [place], [place] [address]   CBC with Differential     Status: Abnormal   Collection Time: 12/23/18  1:33 AM  Result Value Ref Range   WBC 9.8 4.0 - 10.5 K/uL   RBC 5.50 (H) 3.87 - 5.11 MIL/uL   Hemoglobin 13.6 12.0 - 15.0 g/dL   HCT 43.7 36.0 - 46.0 %   MCV 79.5 (L) 80.0 - 100.0 fL   MCH 24.7 (L) 26.0 - 34.0 pg   MCHC 31.1 30.0 - 36.0 g/dL   RDW 15.9 (H) 11.5 - 15.5 %   Platelets 374 150 - 400 K/uL   nRBC 0.0 0.0 - 0.2 %   Neutrophils Relative % 54 %   Neutro Abs 5.3 1.7 - 7.7 K/uL   Lymphocytes Relative 37 %   Lymphs Abs 3.6 0.7 - 4.0 K/uL   Monocytes Relative 7 %   Monocytes Absolute 0.6 0.1 - 1.0 K/uL   Eosinophils Relative 2 %   Eosinophils Absolute 0.2 0.0 - 0.5 K/uL   Basophils Relative 0 %   Basophils Absolute 0.0 0.0 - 0.1 K/uL   Immature Granulocytes 0 %   Abs Immature Granulocytes 0.03 0.00 - 0.07 K/uL    Comment: Performed at [facility], [address] [place], [place] [id number]  Basic metabolic panel     Status: Abnormal   Collection Time: 12/23/18  1:33 AM  Result Value Ref Range   Sodium 142 135 - 145 mmol/L   Potassium 4.0 3.5 - 5.1 mmol/L   Chloride 106 98 - 111 mmol/L   CO2 23 22 - 32 mmol/L   Glucose, Bld  113 (H) 70 - 99 mg/dL   BUN 13 6 - 20 mg/dL   Creatinine, Ser 0.90 0.44 - 1.00 mg/dL   Calcium 9.1 8.9 - 10.3 mg/dL   GFR calc non Af Amer >60 >60 mL/min   GFR calc Af Amer >60 >60 mL/min   Anion gap 13 5 - 15    Comment: Performed at [facility], [address] [place], [place] [address]  Urinalysis, Complete w Microscopic     Status: Abnormal   Collection Time: 12/23/18  1:33 AM  Result Value Ref Range   Color, Urine YELLOW (A) YELLOW   APPearance HAZY (A) CLEAR   Specific Gravity, Urine 1.026 1.005 - 1.030   pH 5.0 5.0 - 8.0   Glucose, UA NEGATIVE NEGATIVE mg/dL   Hgb urine dipstick MODERATE (A) NEGATIVE   Bilirubin Urine NEGATIVE NEGATIVE   Ketones, ur NEGATIVE NEGATIVE mg/dL   Protein, ur NEGATIVE NEGATIVE mg/dL   Nitrite NEGATIVE NEGATIVE   Leukocytes,Ua NEGATIVE NEGATIVE   RBC / HPF 21-50 0 - 5 RBC/hpf   WBC, UA 0-5 0 - 5 WBC/hpf   Bacteria, UA RARE (A) NONE SEEN   Squamous Epithelial / LPF 11-20 0 - 5   Mucus PRESENT     Comment: Performed at [facility], [address] [place],  [address]  Pregnancy, urine POC     Status: None   Collection Time: 12/23/18  1:35 AM  Result Value Ref Range   Preg Test, Ur NEGATIVE NEGATIVE    Comment:        THE SENSITIVITY OF THIS METHODOLOGY IS >24 mIU/mL   UA/M w/rflx Culture, Routine     Status: Abnormal   Collection Time: 01/19/19  9:09 AM   Specimen: Urine  URINE  Result Value Ref Range   Specific Gravity, UA 1.026 1.005 - 1.030   pH, UA 5.0 5.0 - 7.5   Color, UA Yellow Yellow   Appearance Ur Cloudy (A) Clear   Leukocytes,UA Negative Negative   Protein,UA Negative Negative/Trace   Glucose, UA Negative Negative   Ketones, UA Trace (A) Negative   RBC, UA Negative Negative   Bilirubin, UA Negative Negative   Urobilinogen, Ur 0.2 0.2 - 1.0 mg/dL   Nitrite, UA Negative Negative   Microscopic Examination Comment     Comment: Microscopic follows if indicated.   Microscopic Examination See  below:     Comment: Microscopic was indicated and was performed.   Urinalysis Reflex Comment     Comment: This specimen will not reflex to a Urine Culture.  Microscopic Examination     Status: Abnormal   Collection Time: 01/19/19  9:09 AM   URINE  Result Value Ref Range   WBC, UA 0-5 0 - 5 /hpf   RBC 3-10 (A) 0 - 2 /hpf   Epithelial Cells (non renal) >10 (A) 0 - 10 /hpf   Casts None seen None seen /lpf   Crystals Present (A) N/A   Crystal Type Calcium Oxalate N/A   Mucus, UA Present Not Estab.   Bacteria, UA None seen None seen/Few    Assessment/Plan: 1. Encounter for general adult medical examination with abnormal findings Annual health maintenance exam today.  2. Essential hypertension Should take toprol XL 100mg  daily and amlodipine 5mg  daily. Refills of both were given today. Advised her to follow up with cardiology as scheduled.  - metoprolol succinate (TOPROL-XL) 100 MG 24 hr tablet; Take 1 tablet (100 mg total) by mouth daily.  Dispense: 30 tablet; Refill: 3 - amLODipine (NORVASC) 5 MG tablet; Take 1 tablet (5 mg total) by mouth daily.  Dispense: 30 tablet; Refill: 3  3. Palpitations Should take toprol XL 100mg  daily and amlodipine 5mg  daily. Refills of both were given today. Advised her to follow up with cardiology as scheduled.   4. Encounter for screening mammogram for malignant neoplasm of breast - screening mammo; Future  5. Dysuria - UA/M w/rflx Culture, Routine  General Counseling: Destiny Singh verbalizes understanding of the findings of todays visit and agrees with plan of treatment. I have discussed any further diagnostic evaluation that may be needed or ordered today. We also reviewed her medications today. she has been encouraged to call the office with any questions or concerns that should arise related to todays visit.    Counseling:  Hypertension Counseling:   The following hypertensive lifestyle modification were recommended and discussed:  1. Limiting  alcohol intake to less than 1 oz/[place]zz[place] of ethanol:(24 oz of beer or 8 oz of wine or 2 oz of 100-proof whiskey). 2. Take baby ASA 81 mg daily. 3. Importance of regular aerobic exercise and losing weight. 4. Reduce dietary saturated fat and cholesterol intake for overall cardiovascular health. 5. Maintaining adequate dietary potassium, calcium, and magnesium intake. 6. Regular monitoring of the blood pressure. 7. Reduce sodium intake to less than 100 mmol/[place] (less than 2.3 gm of sodium or less than 6 gm of sodium choride)   This patient was seen by [place] with Dr Lavera Guise as a part of collaborative care agreement  Orders Placed This Encounter  Procedures  . Microscopic Examination  . screening mammo  . UA/M w/rflx Culture, Routine    Meds ordered this encounter  Medications  . metoprolol  succinate (TOPROL-XL) 100 MG 24 hr tablet    Sig: Take 1 tablet (100 mg total) by mouth daily.    Dispense:  30 tablet    Refill:  3    No insurance use coupon RXBIN: [id number]; RXGRP: [id number]; [id number]: [place]; MEMBER [id number]    Order Specific Question:   Supervising Provider    Answer:   Lavera Guise [id number]  . amLODipine (NORVASC) 5 MG tablet    Sig: Take 1 tablet (5 mg total) by mouth daily.    Dispense:  30 tablet    Refill:  3    Order Specific Question:   Supervising Provider    Answer:   Lavera Guise [id number]    Time spent: [place], MD  Internal Medicine

## 2019-01-20 LAB — MICROSCOPIC EXAMINATION
Bacteria, UA: NONE SEEN
Casts: NONE SEEN /lpf
Epithelial Cells (non renal): >10 /hpf — AB (ref 0–10)

## 2019-01-20 LAB — UA/M W/RFLX CULTURE, ROUTINE
Bilirubin, UA: NEGATIVE
Glucose, UA: NEGATIVE
Leukocytes,UA: NEGATIVE
Nitrite, UA: NEGATIVE
Protein,UA: NEGATIVE
RBC, UA: NEGATIVE
Specific Gravity, UA: 1.026 (ref 1.005–1.030)
Urobilinogen, Ur: 0.2 mg/dL (ref 0.2–1.0)
pH, UA: 5 (ref 5.0–7.5)

## 2019-01-22 NOTE — Progress Notes (Signed)
Suspect contamination. Patient asymptomatic at time of visit.

## 2019-02-14 ENCOUNTER — Telehealth: Payer: Self-pay

## 2019-02-14 NOTE — Telephone Encounter (Signed)
Patient rescheduled appointment on 02/16/2019 to 02/22/2019. klh

## 2019-02-14 NOTE — Telephone Encounter (Signed)
[name] FOR PATIENT TO CONFIRM AND SCREEN FOR 02-16-19 OV. °

## 2019-02-16 ENCOUNTER — Ambulatory Visit: Payer: Self-pay | Admitting: Nurse Practitioner

## 2019-02-20 ENCOUNTER — Telehealth: Payer: Self-pay

## 2019-02-20 NOTE — Telephone Encounter (Signed)
Rescheduled appointment on 02/22/2019 to 03/18/2018, patient was exposed to covid. klh

## 2019-02-20 NOTE — Telephone Encounter (Signed)
[name] for patient to confirm and screen for 02-22-19 ov.

## 2019-02-22 ENCOUNTER — Ambulatory Visit: Payer: Self-pay | Admitting: Nurse Practitioner

## 2019-03-15 ENCOUNTER — Telehealth: Payer: Self-pay

## 2019-03-15 NOTE — Telephone Encounter (Signed)
[name] FOR PATIENT TO CONFIRM AND SCREEN FOR 03-19-19 OV.

## 2019-03-19 ENCOUNTER — Ambulatory Visit: Payer: Self-pay | Admitting: Nurse Practitioner

## 2019-03-26 ENCOUNTER — Ambulatory Visit: Payer: Self-pay | Admitting: Nurse Practitioner

## 2019-03-29 ENCOUNTER — Telehealth: Payer: Self-pay

## 2019-03-29 NOTE — Telephone Encounter (Signed)
lmom for patient to screen and confirm as in office ov for 04-02-19.

## 2019-03-30 ENCOUNTER — Telehealth: Payer: Self-pay

## 2019-03-30 NOTE — Telephone Encounter (Signed)
Patient rescheduled appointment on 04/02/2019 to 04/24/2019. klh

## 2019-04-02 ENCOUNTER — Ambulatory Visit: Payer: Self-pay | Admitting: Nurse Practitioner

## 2019-04-19 ENCOUNTER — Telehealth: Payer: Self-pay

## 2019-04-19 NOTE — Telephone Encounter (Signed)
[name] TO CONFIRM AND SCREEN FOR 04-24-19 OV.

## 2019-04-23 ENCOUNTER — Other Ambulatory Visit: Payer: Self-pay

## 2019-04-23 ENCOUNTER — Other Ambulatory Visit
Admission: RE | Admit: 2019-04-23 | Discharge: 2019-04-23 | Disposition: A | Discharge status: Home / Self Care | Payer: Self-pay | Source: Ambulatory Visit | Attending: Nurse Practitioner | Admitting: Nurse Practitioner

## 2019-04-23 DIAGNOSIS — R002 Palpitations: Secondary | ICD-10-CM | POA: Insufficient documentation

## 2019-04-23 DIAGNOSIS — Z0000 Encounter for general adult medical examination without abnormal findings: Secondary | ICD-10-CM | POA: Insufficient documentation

## 2019-04-23 DIAGNOSIS — Z7984 Long term (current) use of oral hypoglycemic drugs: Secondary | ICD-10-CM | POA: Insufficient documentation

## 2019-04-23 DIAGNOSIS — R635 Abnormal weight gain: Secondary | ICD-10-CM | POA: Insufficient documentation

## 2019-04-23 DIAGNOSIS — R7302 Impaired glucose tolerance (oral): Secondary | ICD-10-CM | POA: Insufficient documentation

## 2019-04-23 DIAGNOSIS — I10 Essential (primary) hypertension: Secondary | ICD-10-CM | POA: Insufficient documentation

## 2019-04-23 LAB — LIPID PANEL
Cholesterol: 124 mg/dL (ref 0–200)
HDL: 63 mg/dL (ref >40)
LDL Cholesterol: 45 mg/dL (ref 0–99)
Total CHOL/HDL Ratio: 2 RATIO
Triglycerides: 79 mg/dL (ref <150)
VLDL: 16 mg/dL (ref 0–40)

## 2019-04-23 LAB — COMPREHENSIVE METABOLIC PANEL
ALT: 20 U/L (ref 0–44)
AST: 19 U/L (ref 15–41)
Albumin: 3.7 g/dL (ref 3.5–5.0)
Alkaline Phosphatase: 91 U/L (ref 38–126)
Anion gap: 8 (ref 5–15)
BUN: 14 mg/dL (ref 6–20)
CO2: 25 mmol/L (ref 22–32)
Calcium: 9.1 mg/dL (ref 8.9–10.3)
Chloride: 107 mmol/L (ref 98–111)
Creatinine, Ser: 0.95 mg/dL (ref 0.44–1.00)
GFR calc Af Amer: >60 mL/min (ref >60)
GFR calc non Af Amer: >60 mL/min (ref >60)
Glucose, Bld: 134 mg/dL — ABNORMAL HIGH (ref 70–99)
Potassium: 4.1 mmol/L (ref 3.5–5.1)
Sodium: 140 mmol/L (ref 135–145)
Total Bilirubin: 0.4 mg/dL (ref 0.3–1.2)
Total Protein: 7.8 g/dL (ref 6.5–8.1)

## 2019-04-23 LAB — CBC
HCT: 43.2 % (ref 36.0–46.0)
Hemoglobin: 13.5 g/dL (ref 12.0–15.0)
MCH: 24.8 pg — ABNORMAL LOW (ref 26.0–34.0)
MCHC: 31.3 g/dL (ref 30.0–36.0)
MCV: 79.4 fL — ABNORMAL LOW (ref 80.0–100.0)
Platelets: 399 10*3/uL (ref 150–400)
RBC: 5.44 MIL/uL — ABNORMAL HIGH (ref 3.87–5.11)
RDW: 16.2 % — ABNORMAL HIGH (ref 11.5–15.5)
WBC: 8.7 10*3/uL (ref 4.0–10.5)
nRBC: 0 % (ref 0.0–0.2)

## 2019-04-23 LAB — T4, FREE: Free T4: 0.78 ng/dL (ref 0.61–1.12)

## 2019-04-23 LAB — HEMOGLOBIN A1C
Hgb A1c MFr Bld: 7.2 % — ABNORMAL HIGH (ref 4.8–5.6)
Mean Plasma Glucose: 159.94 mg/dL

## 2019-04-23 LAB — TSH: TSH: 1.018 u[IU]/mL (ref 0.350–4.500)

## 2019-04-24 ENCOUNTER — Encounter: Payer: Self-pay | Admitting: Nurse Practitioner

## 2019-04-24 ENCOUNTER — Ambulatory Visit: Payer: Self-pay | Admitting: Nurse Practitioner

## 2019-04-24 VITALS — BP 180/104 | HR 84 | Temp 97.4°F | Resp 16 | Ht 69.0 in | Wt 291.8 lb

## 2019-04-24 DIAGNOSIS — E1165 Type 2 diabetes mellitus with hyperglycemia: Secondary | ICD-10-CM

## 2019-04-24 DIAGNOSIS — I10 Essential (primary) hypertension: Secondary | ICD-10-CM

## 2019-04-24 DIAGNOSIS — R002 Palpitations: Secondary | ICD-10-CM

## 2019-04-24 MED ORDER — METOPROLOL TARTRATE 50 MG PO TABS: 50.0000 mg | ORAL_TABLET | Freq: Two times a day (BID) | ORAL | 3 refills | Status: DC

## 2019-04-24 MED ORDER — METFORMIN HCL 500 MG PO TABS: 500.0000 mg | ORAL_TABLET | Freq: Two times a day (BID) | ORAL | 3 refills | Status: DC

## 2019-04-24 NOTE — Progress Notes (Signed)
[facility] [place], [place] [address]  Internal MEDICINE  Office Visit Note  Patient Name: Destiny Singh  [id number]  [id number]  Date of Service: 04/24/2019  Chief Complaint  Patient presents with  . Hypertension    fluid retention   . Foot Problem    dark marks on foot   . Follow-up    review labs, took cough medicine prior to lab work, but otherwise fasting   . Palpitations  . weight management    The patient is here for routine follow up. Had routine, fasting labs done yesterday. Her blood sugar was 134 with HgbA1c 7.2. she has not had a diagnosis of diabetes in the past. Her blood pressure is very elevated today. She has some swelling in her lower legs and ankles. Is prescribed amlodipine 5mg  daily and Toprol XL 100mg  daily. She states that she has not been taking these for at least a month. States that when she went to pick them up, they were over $75. She denies chest pain, chest pressure, or headaches. Does have intermittent palpitations. Lipid panel, thyroid panel, and CBC were good.       Current Medication: Outpatient Encounter Medications as of 04/24/2019  Medication Sig  . [DISCONTINUED] amLODipine (NORVASC) 5 MG tablet Take 1 tablet (5 mg total) by mouth daily.  . [DISCONTINUED] HYDROcodone-acetaminophen (NORCO/VICODIN) 5-325 MG tablet Take 1 tablet by mouth every 6 (six) hours as needed for moderate pain.  . [DISCONTINUED] metoprolol succinate (TOPROL-XL) 100 MG 24 hr tablet Take 1 tablet (100 mg total) by mouth daily.  . metFORMIN (GLUCOPHAGE) 500 MG tablet Take 1 tablet (500 mg total) by mouth 2 (two) times daily with a meal.  . metoprolol tartrate (LOPRESSOR) 50 MG tablet Take 1 tablet (50 mg total) by mouth 2 (two) times daily.   No facility-administered encounter medications on file as of 04/24/2019.    Surgical History: Past Surgical History:  Procedure Laterality Date  . BREAST SURGERY Bilateral    BENIGN TUMORS  . CHONDROPLASTY  Right 04/01/2016   Procedure: CHONDROPLASTY -ABRASION OF FEMORAL TROCHLEA, LATERAL TIBIA Plateau;  Surgeon: Corky Mull, MD;  Location: [facility] ORS;  Service: Orthopedics;  Laterality: Right;  . KNEE ARTHROSCOPY WITH MENISCAL REPAIR Right 04/01/2016   Procedure: KNEE ARTHROSCOPY WITH PARTIAL MEDIAL  MENISCAL REPAIR;  Surgeon: Corky Mull, MD;  Location: [facility] ORS;  Service: Orthopedics;  Laterality: Right;  . TUBAL LIGATION    . TUMOR EXCISION     BENIGN-HEAD    Medical History: Past Medical History:  Diagnosis Date  . Adopted   . Arthritis    BOTH KNEES  . Asthma   . Benign tumor   . Dyspnea    VERY RARE  . Dysrhythmia    IRREGULAR PER PT  . Headache   . Hypertension   . Myocardial infarction ([place])    AGE 49    Family History: Family History  Adopted: Yes    Social History   Socioeconomic History  . Marital status: Single    Spouse name: Not on file  . Number of children: Not on file  . Years of education: Not on file  . Highest education level: Not on file  Occupational History  . Not on file  Tobacco Use  . Smoking status: Never Smoker  . Smokeless tobacco: Never Used  Substance and Sexual Activity  . Alcohol use: No  . Drug use: No  . Sexual activity: Not on file  Other Topics Concern  . Not on file  Social History Narrative  . Not on file   Social Determinants of Health   Financial Resource Strain:   . Difficulty of Paying Living Expenses: Not on file  Food Insecurity:   . Worried About Charity fundraiser in the Last Year: Not on file  . Ran Out of Food in the Last Year: Not on file  Transportation Needs:   . Lack of Transportation (Medical): Not on file  . Lack of Transportation (Non-Medical): Not on file  Physical Activity:   . Days of Exercise per Week: Not on file  . Minutes of Exercise per Session: Not on file  Stress:   . Feeling of Stress : Not on file  Social Connections:   . Frequency of Communication with Friends and Family: Not on  file  . Frequency of Social Gatherings with Friends and Family: Not on file  . Attends Religious Services: Not on file  . Active Member of Clubs or Organizations: Not on file  . Attends Archivist Meetings: Not on file  . Marital Status: Not on file  Intimate Partner Violence:   . Fear of Current or Ex-Partner: Not on file  . Emotionally Abused: Not on file  . Physically Abused: Not on file  . Sexually Abused: Not on file      Review of Systems  Constitutional: Positive for fatigue and unexpected weight change. Negative for activity change.       Unexpected weight gain   HENT: Negative for congestion, postnasal drip, rhinorrhea, sinus pain, sneezing and sore throat.   Respiratory: Negative for shortness of breath and wheezing.   Cardiovascular: Positive for palpitations. Negative for chest pain and leg swelling.       Has had few episodes of palpitations.   Gastrointestinal: Negative for abdominal pain, constipation, diarrhea, nausea and vomiting.  Endocrine: Negative for cold intolerance, heat intolerance, polydipsia and polyuria.  Musculoskeletal: Positive for arthralgias and myalgias.  Allergic/Immunologic: Negative for environmental allergies.  Neurological: Negative for dizziness and headaches.  Hematological: Negative for adenopathy.  Psychiatric/Behavioral: Negative for agitation. The patient is nervous/anxious.     Today's Vitals   04/24/19 0915  BP: (!) 180/104  Pulse: 84  Resp: 16  Temp: (!) 97.4 F (36.3 C)  SpO2: 97%  Weight: 291 lb 12.8 oz (132.4 kg)  Height: 5\' 9"  (1.753 m)   Body mass index is 43.09 kg/m.  Physical Exam Vitals and nursing note reviewed.  Constitutional:      General: She is not in acute distress.    Appearance: Normal appearance. She is well-developed. She is obese. She is not diaphoretic.  HENT:     Head: Normocephalic and atraumatic.     Mouth/Throat:     Pharynx: No oropharyngeal exudate.  Eyes:     Pupils: Pupils are  equal, round, and reactive to light.  Neck:     Thyroid: No thyromegaly.     Vascular: No carotid bruit or JVD.     Trachea: No tracheal deviation.  Cardiovascular:     Rate and Rhythm: Normal rate and regular rhythm.     Heart sounds: Normal heart sounds. No murmur. No friction rub. No gallop.      Comments: Mild, bilateral dependant edema Pulmonary:     Effort: Pulmonary effort is normal. No respiratory distress.     Breath sounds: Normal breath sounds. No wheezing or rales.  Chest:     Chest wall: No tenderness.  Abdominal:     Palpations: Abdomen is soft.  Musculoskeletal:        General: Normal range of motion.     Cervical back: Normal range of motion and neck supple.  Lymphadenopathy:     Cervical: No cervical adenopathy.  Skin:    General: Skin is warm and dry.  Neurological:     Mental Status: She is alert and oriented to person, place, and time.     Cranial Nerves: No cranial nerve deficit.  Psychiatric:        Mood and Affect: Mood normal.        Behavior: Behavior normal.        Thought Content: Thought content normal.        Judgment: Judgment normal.    Assessment/Plan: 1. Type 2 diabetes mellitus with hyperglycemia, without long-term current use of insulin (HCC) Recent check HgbA1c 7.2. new diagnosis. Start patient on metformin 500mg  bid. accu-check glucose monitor provided. Advised patient she check blood sugars once daily, prior to eating, keeping log. writtent information about diabetic diet given to the patient.  - metFORMIN (GLUCOPHAGE) 500 MG tablet; Take 1 tablet (500 mg total) by mouth 2 (two) times daily with a meal.  Dispense: 60 tablet; Refill: 3  2. Essential hypertension Restart metoprolol 50mg  twice daily. GoodRx card provided to help with cost. Advised she monitor blood pressure closely.  - metoprolol tartrate (LOPRESSOR) 50 MG tablet; Take 1 tablet (50 mg total) by mouth 2 (two) times daily.  Dispense: 60 tablet; Refill: 3  3.  Palpitations Restart toprol 50mg  bid. Monitor closely.   General Counseling: Yissel verbalizes understanding of the findings of todays visit and agrees with plan of treatment. I have discussed any further diagnostic evaluation that may be needed or ordered today. We also reviewed her medications today. she has been encouraged to call the office with any questions or concerns that should arise related to todays visit.  Diabetes Counseling:  1. Addition of ACE inh/ ARB'S for nephroprotection. Microalbumin is updated  2. Diabetic foot care, prevention of complications. Podiatry consult 3. Exercise and lose weight.  4. Diabetic eye examination, Diabetic eye exam is updated  5. Monitor blood sugar closlely. nutrition counseling.  6. Sign and symptoms of hypoglycemia including shaking sweating,confusion and headaches.  This patient was seen by [place] with Dr Lavera Guise as a part of collaborative care agreement  Meds ordered this encounter  Medications  . metoprolol tartrate (LOPRESSOR) 50 MG tablet    Sig: Take 1 tablet (50 mg total) by mouth 2 (two) times daily.    Dispense:  60 tablet    Refill:  3    Order Specific Question:   Supervising Provider    Answer:   Lavera Guise [id number]  . metFORMIN (GLUCOPHAGE) 500 MG tablet    Sig: Take 1 tablet (500 mg total) by mouth 2 (two) times daily with a meal.    Dispense:  60 tablet    Refill:  3    Order Specific Question:   Supervising Provider    Answer:   Lavera Guise [id number]    Total time spent: 30 Minutes   Time spent includes review of chart, medications, test results, and follow up plan with the patient.      Dr Lavera Guise Internal medicine

## 2019-04-24 NOTE — Progress Notes (Signed)
Reviewed with patient at visit. Started metformin 500mg  bid

## 2019-05-23 ENCOUNTER — Telehealth: Payer: Self-pay

## 2019-05-23 NOTE — Telephone Encounter (Signed)
[name] FOR PATIENT TO CONFIRM AND SCREEN FOR 05-25-19 OV.

## 2019-05-25 ENCOUNTER — Ambulatory Visit: Payer: Self-pay | Admitting: Nurse Practitioner

## 2019-06-11 ENCOUNTER — Ambulatory Visit: Payer: Self-pay | Admitting: Nurse Practitioner

## 2019-06-22 ENCOUNTER — Telehealth: Payer: Self-pay

## 2019-06-22 NOTE — Telephone Encounter (Signed)
[name] to confirm and screen for 06-26-19 ov. Also reminded to bring blood sugar log along to appt.

## 2019-06-26 ENCOUNTER — Encounter: Payer: Self-pay | Admitting: Nurse Practitioner

## 2019-06-26 ENCOUNTER — Other Ambulatory Visit: Payer: Self-pay

## 2019-06-26 ENCOUNTER — Ambulatory Visit: Payer: Self-pay | Admitting: Nurse Practitioner

## 2019-06-26 VITALS — BP 132/80 | HR 86 | Temp 97.5°F | Resp 16 | Ht 69.0 in | Wt 293.6 lb

## 2019-06-26 DIAGNOSIS — E1165 Type 2 diabetes mellitus with hyperglycemia: Secondary | ICD-10-CM

## 2019-06-26 DIAGNOSIS — I10 Essential (primary) hypertension: Secondary | ICD-10-CM

## 2019-06-26 MED ORDER — PHENTERMINE HCL 37.5 MG PO TABS: 37.5000 mg | ORAL_TABLET | Freq: Every day | ORAL | 1 refills | Status: DC

## 2019-06-26 MED ORDER — HYDROCHLOROTHIAZIDE 12.5 MG PO TABS: 12.5000 mg | ORAL_TABLET | Freq: Every day | ORAL | 3 refills | Status: DC

## 2019-06-26 NOTE — Progress Notes (Signed)
[facility] [place], [place] [address]  Internal MEDICINE  Office Visit Note  Patient Name: Destiny Singh  [id number]  [id number]  Date of Service: 06/26/2019  Chief Complaint  Patient presents with  . Diabetes  . Hypertension    The patient is here for follow up visit. She was restarted on her blood pressure medications. Her blood pressure is much improved, though still elevated. She has some fluid retention in her lower legs and feet. Did just get off work and has been on her feet all night. She does not wear compression socks at this time.  She also started metformin 500mg  bid. She has brought her blood sugar log with her. Blood sugars are generally in the 90s and low 100s.  She has tolerated the metformin well. She is very concerned about weight gain. She has gained 2 pounds since her last visit . Possible ventral hernia. Fees pressure in upper part of the abdomen and with straining or sitting up, swelling feels hard and larger, then cannot be felt when she is lying down flat or standing up.       Current Medication: Outpatient Encounter Medications as of 06/26/2019  Medication Sig  . metFORMIN (GLUCOPHAGE) 500 MG tablet Take 1 tablet (500 mg total) by mouth 2 (two) times daily with a meal.  . metoprolol tartrate (LOPRESSOR) 50 MG tablet Take 1 tablet (50 mg total) by mouth 2 (two) times daily.  . hydrochlorothiazide (HYDRODIURIL) 12.5 MG tablet Take 1 tablet (12.5 mg total) by mouth daily.  . phentermine (ADIPEX-P) 37.5 MG tablet Take 1 tablet (37.5 mg total) by mouth daily before breakfast.   No facility-administered encounter medications on file as of 06/26/2019.    Surgical History: Past Surgical History:  Procedure Laterality Date  . BREAST SURGERY Bilateral    BENIGN TUMORS  . CHONDROPLASTY Right 04/01/2016   Procedure: CHONDROPLASTY -ABRASION OF FEMORAL TROCHLEA, LATERAL TIBIA Plateau;  Surgeon: Corky Mull, MD;  Location: [facility] ORS;  Service:  Orthopedics;  Laterality: Right;  . KNEE ARTHROSCOPY WITH MENISCAL REPAIR Right 04/01/2016   Procedure: KNEE ARTHROSCOPY WITH PARTIAL MEDIAL  MENISCAL REPAIR;  Surgeon: Corky Mull, MD;  Location: [facility] ORS;  Service: Orthopedics;  Laterality: Right;  . TUBAL LIGATION    . TUMOR EXCISION     BENIGN-HEAD    Medical History: Past Medical History:  Diagnosis Date  . Adopted   . Arthritis    BOTH KNEES  . Asthma   . Benign tumor   . Dyspnea    VERY RARE  . Dysrhythmia    IRREGULAR PER PT  . Headache   . Hypertension   . Myocardial infarction ([place])    AGE 30    Family History: Family History  Adopted: Yes    Social History   Socioeconomic History  . Marital status: Single    Spouse name: Not on file  . Number of children: Not on file  . Years of education: Not on file  . Highest education level: Not on file  Occupational History  . Not on file  Tobacco Use  . Smoking status: Never Smoker  . Smokeless tobacco: Never Used  Substance and Sexual Activity  . Alcohol use: No  . Drug use: No  . Sexual activity: Not on file  Other Topics Concern  . Not on file  Social History Narrative  . Not on file   Social Determinants of Health   Financial Resource Strain:   .  Difficulty of Paying Living Expenses:   Food Insecurity:   . Worried About Charity fundraiser in the Last Year:   . Arboriculturist in the Last Year:   Transportation Needs:   . Film/video editor (Medical):   Marland Kitchen Lack of Transportation (Non-Medical):   Physical Activity:   . Days of Exercise per Week:   . Minutes of Exercise per Session:   Stress:   . Feeling of Stress :   Social Connections:   . Frequency of Communication with Friends and Family:   . Frequency of Social Gatherings with Friends and Family:   . Attends Religious Services:   . Active Member of Clubs or Organizations:   . Attends Archivist Meetings:   Marland Kitchen Marital Status:   Intimate Partner Violence:   . Fear of Current  or Ex-Partner:   . Emotionally Abused:   Marland Kitchen Physically Abused:   . Sexually Abused:       Review of Systems  Constitutional: Negative for activity change, chills, fatigue and unexpected weight change.       Weight gain of three pounds since last visit.   HENT: Negative for congestion, postnasal drip, rhinorrhea, sneezing and sore throat.   Respiratory: Negative for cough, chest tightness, shortness of breath and wheezing.   Cardiovascular: Negative for chest pain and palpitations.       Improved blood pressure   Gastrointestinal: Negative for abdominal pain, constipation, diarrhea, nausea and vomiting.       Possible abdominal hernia.   Endocrine: Negative for cold intolerance, heat intolerance, polydipsia and polyuria.       Pateit brought glucometer with her. Blood sugars are running between 90 and 130  Musculoskeletal: Negative for arthralgias, back pain, joint swelling and neck pain.  Skin: Negative for rash.  Allergic/Immunologic: Negative for environmental allergies.  Neurological: Negative for dizziness, tremors, numbness and headaches.  Hematological: Negative for adenopathy. Does not bruise/bleed easily.  Psychiatric/Behavioral: Negative for behavioral problems (Depression), sleep disturbance and suicidal ideas. The patient is not nervous/anxious.     Today's Vitals   06/26/19 0832  BP: 132/80  Pulse: 86  Resp: 16  Temp: (!) 97.5 F (36.4 C)  SpO2: 99%  Weight: 293 lb 9.6 oz (133.2 kg)  Height: 5\' 9"  (1.753 m)   Body mass index is 43.36 kg/m.  Physical Exam Vitals and nursing note reviewed.  Constitutional:      General: She is not in acute distress.    Appearance: Normal appearance. She is well-developed. She is obese. She is not diaphoretic.  HENT:     Head: Normocephalic and atraumatic.     Mouth/Throat:     Pharynx: No oropharyngeal exudate.  Eyes:     Pupils: Pupils are equal, round, and reactive to light.  Neck:     Thyroid: No thyromegaly.      Vascular: No carotid bruit or JVD.     Trachea: No tracheal deviation.  Cardiovascular:     Rate and Rhythm: Normal rate and regular rhythm.     Heart sounds: Normal heart sounds. No murmur. No friction rub. No gallop.      Comments: Mild, bilateral dependant edema Pulmonary:     Effort: Pulmonary effort is normal. No respiratory distress.     Breath sounds: Normal breath sounds. No wheezing or rales.  Chest:     Chest wall: No tenderness.  Abdominal:     Palpations: Abdomen is soft.     Hernia: A hernia  is present. Hernia is present in the ventral area.     Comments: Small, nontender, ventral hernia noted.  Musculoskeletal:        General: Normal range of motion.     Cervical back: Normal range of motion and neck supple.  Lymphadenopathy:     Cervical: No cervical adenopathy.  Skin:    General: Skin is warm and dry.  Neurological:     Mental Status: She is alert and oriented to person, place, and time.     Cranial Nerves: No cranial nerve deficit.  Psychiatric:        Mood and Affect: Mood normal.        Behavior: Behavior normal.        Thought Content: Thought content normal.        Judgment: Judgment normal.   Assessment/Plan: 1. Essential hypertension Improved. Still having mild pitting edema in lower extremities. Add HCTZ 12.5mg  daily. Continue metoprolol as prescribed.  - hydrochlorothiazide (HYDRODIURIL) 12.5 MG tablet; Take 1 tablet (12.5 mg total) by mouth daily.  Dispense: 90 tablet; Refill: 3  2. Type 2 diabetes mellitus with hyperglycemia, without long-term current use of insulin ([place]) Patient tolerating metformin well. Blood sugars between 90 and 120.   3. Morbid obesity ([place]) Start phenternine 37.5mg  daily. Limit calorie intake to 1500 calories per [place]. Incorporate exercise into daily routine  - phentermine (ADIPEX-P) 37.5 MG tablet; Take 1 tablet (37.5 mg total) by mouth daily before breakfast.  Dispense: 30 tablet; Refill: 1  General Counseling: Elysabeth  verbalizes understanding of the findings of todays visit and agrees with plan of treatment. I have discussed any further diagnostic evaluation that may be needed or ordered today. We also reviewed her medications today. she has been encouraged to call the office with any questions or concerns that should arise related to todays visit.    There is a liability release in patients' chart. There has been a 10 minute discussion about the side effects including but not limited to elevated blood pressure, anxiety, lack of sleep and dry mouth. Pt understands and will like to start/continue on appetite suppressant at this time. There will be one month RX given at the time of visit with proper follow up. Nova diet plan with restricted calories is given to the pt. Pt understands and agrees with  plan of treatment  This patient was seen by Leretha Pol FNP Collaboration with Dr Lavera Guise as a part of collaborative care agreement  Meds ordered this encounter  Medications  . phentermine (ADIPEX-P) 37.5 MG tablet    Sig: Take 1 tablet (37.5 mg total) by mouth daily before breakfast.    Dispense:  30 tablet    Refill:  1    Order Specific Question:   Supervising Provider    Answer:   Lavera Guise Scalp Level  . hydrochlorothiazide (HYDRODIURIL) 12.5 MG tablet    Sig: Take 1 tablet (12.5 mg total) by mouth daily.    Dispense:  90 tablet    Refill:  3    Order Specific Question:   Supervising Provider    Answer:   Lavera Guise [id number]    Total time spent: 30 Minutes  Time spent includes review of chart, medications, test results, and follow up plan with the patient.      Dr Lavera Guise Internal medicine

## 2019-07-14 ENCOUNTER — Encounter: Payer: Self-pay | Admitting: Emergency Medicine

## 2019-07-14 ENCOUNTER — Emergency Department
Admission: EM | Admit: 2019-07-14 | Discharge: 2019-07-14 | Disposition: A | Discharge status: Home / Self Care | Payer: Self-pay | Attending: Emergency Medicine | Admitting: Emergency Medicine

## 2019-07-14 ENCOUNTER — Other Ambulatory Visit: Payer: Self-pay

## 2019-07-14 DIAGNOSIS — E119 Type 2 diabetes mellitus without complications: Secondary | ICD-10-CM | POA: Insufficient documentation

## 2019-07-14 DIAGNOSIS — I10 Essential (primary) hypertension: Secondary | ICD-10-CM | POA: Insufficient documentation

## 2019-07-14 DIAGNOSIS — Z7984 Long term (current) use of oral hypoglycemic drugs: Secondary | ICD-10-CM | POA: Insufficient documentation

## 2019-07-14 DIAGNOSIS — K0889 Other specified disorders of teeth and supporting structures: Secondary | ICD-10-CM | POA: Insufficient documentation

## 2019-07-14 DIAGNOSIS — I252 Old myocardial infarction: Secondary | ICD-10-CM | POA: Insufficient documentation

## 2019-07-14 DIAGNOSIS — Z79899 Other long term (current) drug therapy: Secondary | ICD-10-CM | POA: Insufficient documentation

## 2019-07-14 MED ORDER — AMOXICILLIN 875 MG PO TABS: 875.0000 mg | ORAL_TABLET | Freq: Two times a day (BID) | ORAL | 0 refills | Status: DC

## 2019-07-14 MED ORDER — ACETAMINOPHEN-CODEINE 300-30 MG PO TABS: 1.0000 | ORAL_TABLET | Freq: Four times a day (QID) | ORAL | 0 refills | Status: DC | PRN

## 2019-07-14 NOTE — Discharge Instructions (Addendum)
Keep your appointment with your dentist on Tuesday.  Begin taking medication as directed.  The antibiotic is every [place] twice a [place] until completely finished.  The pain medication is only as needed.  You may also take ibuprofen if additional pain medication as needed.  It is probably necessary for you to only on the left side of your mouth due to your dental injury.

## 2019-07-14 NOTE — ED Provider Notes (Signed)
[facility] Emergency Department Provider Note   ____________________________________________   First MD Initiated Contact with Patient 07/14/19 [phone]     (approximate)  I have reviewed the triage vital signs and the nursing notes.   HISTORY  Chief Complaint Dental Pain   HPI 80 R Gambill is a 49 y.o. female presents to the ED with complaint of dental pain.  Patient states that she cracked a tooth and already has an appointment for Tuesday to see her dentist.  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  Currently she rates her pain as a 10/10.      Past Medical History:  Diagnosis Date  . Adopted   . Arthritis    BOTH KNEES  . Asthma   . Benign tumor   . Dyspnea    VERY RARE  . Dysrhythmia    IRREGULAR PER PT  . Headache   . Hypertension   . Myocardial infarction ([place])    AGE 63    Patient Active Problem List   Diagnosis Date Noted  . Morbid obesity ([place]) 06/26/2019  . Type 2 diabetes mellitus with hyperglycemia, without long-term current use of insulin ([place]) 04/24/2019  . Chest pain of uncertain etiology [id number]  . Palpitations 09/26/2018  . Routine cervical smear 10/12/2017  . Essential hypertension 10/12/2017  . Migraine without aura and without status migrainosus, not intractable 10/12/2017  . Dysuria 10/12/2017    Past Surgical History:  Procedure Laterality Date  . BREAST SURGERY Bilateral    BENIGN TUMORS  . CHONDROPLASTY Right 04/01/2016   Procedure: CHONDROPLASTY -ABRASION OF FEMORAL TROCHLEA, LATERAL TIBIA Plateau;  Surgeon: Corky Mull, MD;  Location: [facility] ORS;  Service: Orthopedics;  Laterality: Right;  . KNEE ARTHROSCOPY WITH MENISCAL REPAIR Right 04/01/2016   Procedure: KNEE ARTHROSCOPY WITH PARTIAL MEDIAL  MENISCAL REPAIR;  Surgeon: Corky Mull, MD;  Location: [facility] ORS;  Service: Orthopedics;  Laterality: Right;  . TUBAL LIGATION    . TUMOR  EXCISION     BENIGN-HEAD    Prior to Admission medications   Medication Sig Start Date End Date Taking? Authorizing Provider  Acetaminophen-Codeine (TYLENOL/CODEINE #3) 300-30 MG tablet Take 1 tablet by mouth every 6 (six) hours as needed for pain. 07/14/19   Johnn Hai, PA-C  amoxicillin (AMOXIL) 875 MG tablet Take 1 tablet (875 mg total) by mouth 2 (two) times daily. 07/14/19   Johnn Hai, PA-C  hydrochlorothiazide (HYDRODIURIL) 12.5 MG tablet Take 1 tablet (12.5 mg total) by mouth daily. 06/26/19   Ronnell Freshwater, NP  metFORMIN (GLUCOPHAGE) 500 MG tablet Take 1 tablet (500 mg total) by mouth 2 (two) times daily with a meal. 04/24/19   Boscia, Greer Ee, NP  metoprolol tartrate (LOPRESSOR) 50 MG tablet Take 1 tablet (50 mg total) by mouth 2 (two) times daily. 04/24/19   Ronnell Freshwater, NP  phentermine (ADIPEX-P) 37.5 MG tablet Take 1 tablet (37.5 mg total) by mouth daily before breakfast. 06/26/19   Ronnell Freshwater, NP    Allergies Bee venom and Other  Family History  Adopted: Yes    Social History Social History   Tobacco Use  . Smoking status: Never Smoker  . Smokeless tobacco: Never Used  Substance Use Topics  . Alcohol use: No  . Drug use: No    Review of Systems Constitutional: No fever/chills Eyes: No visual changes. ENT: Positive for dental pain. Cardiovascular:  Denies chest pain. Respiratory: Denies shortness of breath. Gastrointestinal:   No nausea, no vomiting.  Musculoskeletal: Negative for muscle aches. Skin: Negative for rash. Neurological: Negative for headaches, focal weakness or numbness.  ____________________________________________   PHYSICAL EXAM:  VITAL SIGNS: ED Triage Vitals  Enc Vitals Group     BP 07/14/19 0812 (!) 148/100     Pulse Rate 07/14/19 0812 (!) 104     Resp 07/14/19 0812 18     Temp 07/14/19 0812 98.3 F (36.8 C)     Temp Source 07/14/19 0812 Oral     SpO2 07/14/19 0812 97 %     Weight 07/14/19 0812 203 lb  (92.1 kg)     Height 07/14/19 0812 5' 8.5" (1.74 m)     Head Circumference --      Peak Flow --      Pain Score 07/14/19 0809 10     Pain Loc --      Pain Edu? --      Excl. in [place]? --     Constitutional: Alert and oriented. Well appearing and in no acute distress. Eyes: Conjunctivae are normal.  Head: Atraumatic. Nose: No congestion/rhinnorhea. Mouth/Throat: Mucous membranes are moist.  Oropharynx non-erythematous.  Right lower posterior molar with small fracture line noted on the posterior aspect.  No abscess or drainage noted.  Area is extremely tender to palpation with tongue depressor. Neck: No stridor.  Hematological/Lymphatic/Immunilogical: No cervical lymphadenopathy. Cardiovascular: Normal rate, regular rhythm. Grossly normal heart sounds.  Good peripheral circulation. Respiratory: Normal respiratory effort.  No retractions. Lungs CTAB. Musculoskeletal: Moves upper lower extremities without any difficulty.  Normal gait was noted. Neurologic:  Normal speech and language. No gross focal neurologic deficits are appreciated. No gait instability. Skin:  Skin is warm, dry and intact. No rash noted. Psychiatric: Mood and affect are normal. Speech and behavior are normal.  ____________________________________________   LABS (all labs ordered are listed, but only abnormal results are displayed)  Labs Reviewed - No data to display  PROCEDURES  Procedure(s) performed (including Critical Care):  Procedures   ____________________________________________   INITIAL IMPRESSION / ASSESSMENT AND PLAN / ED COURSE  As part of my medical decision making, I reviewed the following data within the electronic MEDICAL RECORD NUMBER Notes from prior ED visits and [place] Controlled Substance Database  49 year old female presents to the ED with complaint of dental pain.  Patient has history of hypertension has not taken her blood pressure medication today.  Moderate tenderness on palpation of the  right lower posterior molar with tongue depressor.  Patient has an appointment with her dentist on Tuesday.  She is given a prescription for amoxicillin 875 twice daily for 10 days and Tylenol with codeine every 6 hours as needed for pain.  She is already eating a soft diet. ____________________________________________   FINAL CLINICAL IMPRESSION(S) / ED DIAGNOSES  Final diagnoses:  Pain, dental     ED Discharge Orders         Ordered    amoxicillin (AMOXIL) 875 MG tablet  2 times daily     07/14/19 0828    Acetaminophen-Codeine (TYLENOL/CODEINE #3) 300-30 MG tablet  Every 6 hours PRN     07/14/19 [id number]           Note:  This document was prepared using Dragon voice recognition software and may include unintentional dictation errors.    Johnn Hai, PA-C 07/14/19 1427    Delman Kitten, MD 07/14/19 [phone]

## 2019-07-14 NOTE — ED Triage Notes (Signed)
Pt c/o dental pain on right lower side of mouth. Pt is in NAD.

## 2019-07-14 NOTE — ED Notes (Signed)
Pt c/o right sided dental pain x2 weeks where she has cracked a tooth after biting down on food. Pt states she is schedule to see a dentist this Tuesday.

## 2019-07-27 ENCOUNTER — Telehealth: Payer: Self-pay

## 2019-07-27 NOTE — Telephone Encounter (Signed)
[name] to confirm and screen for 07-31-19 ov. 

## 2019-07-31 ENCOUNTER — Ambulatory Visit (INDEPENDENT_AMBULATORY_CARE_PROVIDER_SITE_OTHER): Payer: Self-pay | Admitting: Nurse Practitioner

## 2019-07-31 ENCOUNTER — Other Ambulatory Visit: Payer: Self-pay

## 2019-07-31 ENCOUNTER — Encounter: Payer: Self-pay | Admitting: Nurse Practitioner

## 2019-07-31 VITALS — BP 140/87 | HR 96 | Temp 97.2°F | Resp 16 | Ht 69.0 in | Wt 283.4 lb

## 2019-07-31 DIAGNOSIS — E538 Deficiency of other specified B group vitamins: Secondary | ICD-10-CM

## 2019-07-31 DIAGNOSIS — E1165 Type 2 diabetes mellitus with hyperglycemia: Secondary | ICD-10-CM

## 2019-07-31 DIAGNOSIS — K0889 Other specified disorders of teeth and supporting structures: Secondary | ICD-10-CM

## 2019-07-31 DIAGNOSIS — I10 Essential (primary) hypertension: Secondary | ICD-10-CM

## 2019-07-31 LAB — POCT GLYCOSYLATED HEMOGLOBIN (HGB A1C): Hemoglobin A1C: 6.5 % — AB (ref 4.0–5.6)

## 2019-07-31 MED ORDER — ACETAMINOPHEN-CODEINE 300-30 MG PO TABS: 1.0000 | ORAL_TABLET | Freq: Four times a day (QID) | ORAL | 0 refills | Status: DC | PRN

## 2019-07-31 MED ORDER — METOPROLOL TARTRATE 50 MG PO TABS: 50.0000 mg | ORAL_TABLET | Freq: Two times a day (BID) | ORAL | 3 refills | Status: DC

## 2019-07-31 MED ORDER — PHENTERMINE HCL 37.5 MG PO TABS: 37.5000 mg | ORAL_TABLET | Freq: Every day | ORAL | 0 refills | Status: DC

## 2019-07-31 MED ORDER — CYANOCOBALAMIN 1000 MCG/ML IJ SOLN
1000.0000 ug | Freq: Once | INTRAMUSCULAR | Status: AC
  Administered 2019-07-31: 1000 ug via INTRAMUSCULAR

## 2019-07-31 MED ORDER — METFORMIN HCL 500 MG PO TABS: 500.0000 mg | ORAL_TABLET | Freq: Two times a day (BID) | ORAL | 3 refills | Status: DC

## 2019-07-31 NOTE — Progress Notes (Signed)
[facility] [place], [place] [address]  Internal MEDICINE  Office Visit Note  Patient Name: Destiny Singh  [id number]  [id number]  Date of Service: 07/31/2019  Chief Complaint  Patient presents with  . Diabetes  . Hypertension  . Asthma  . Arthritis  . Medication Refill    tylenol-codeine     The patient is here for follow up of diabetes. Was started on metformin 500mg  twice daily. States that she takes both doses at the same time. HgbA1c is 6.5 today, down from 7.2 at the most recent check. She denies negative side effects associated with taking this medication. Blood pressure is well managed. She is taking phentermine to help her lose weight.  She has already lost 10 pounds since she was last seen in the office. Has made a lot of positive diet changes since her diagnosis of diabetes. She reports no negative side effects associated with taking appetite suppressant.  She states that she had recently had to visit the dentist. Her grandson accidentally hit his head into her face while she ihad hard candy in her mouth. This broke her tooth in several places and caused infection. She is still on amoxicillin and she still has considerable pain. They did prescribe tylenol with codeina for her. She has one left. Her next appointment with dentist August 15, 2019.       Current Medication: Outpatient Encounter Medications as of 07/31/2019  Medication Sig  . Acetaminophen-Codeine (TYLENOL/CODEINE #3) 300-30 MG tablet Take 1 tablet by mouth every 6 (six) hours as needed for pain.  Marland Kitchen amoxicillin (AMOXIL) 875 MG tablet Take 1 tablet (875 mg total) by mouth 2 (two) times daily.  . hydrochlorothiazide (HYDRODIURIL) 12.5 MG tablet Take 1 tablet (12.5 mg total) by mouth daily.  . metFORMIN (GLUCOPHAGE) 500 MG tablet Take 1 tablet (500 mg total) by mouth 2 (two) times daily with a meal.  . metoprolol tartrate (LOPRESSOR) 50 MG tablet Take 1 tablet (50 mg total) by mouth 2 (two)  times daily.  . phentermine (ADIPEX-P) 37.5 MG tablet Take 1 tablet (37.5 mg total) by mouth daily before breakfast.  . [DISCONTINUED] Acetaminophen-Codeine (TYLENOL/CODEINE #3) 300-30 MG tablet Take 1 tablet by mouth every 6 (six) hours as needed for pain.  . [DISCONTINUED] metFORMIN (GLUCOPHAGE) 500 MG tablet Take 1 tablet (500 mg total) by mouth 2 (two) times daily with a meal.  . [DISCONTINUED] metoprolol tartrate (LOPRESSOR) 50 MG tablet Take 1 tablet (50 mg total) by mouth 2 (two) times daily.  . [DISCONTINUED] phentermine (ADIPEX-P) 37.5 MG tablet Take 1 tablet (37.5 mg total) by mouth daily before breakfast.  . [EXPIRED] cyanocobalamin ((VITAMIN B-12)) injection 1,000 mcg    No facility-administered encounter medications on file as of 07/31/2019.    Surgical History: Past Surgical History:  Procedure Laterality Date  . BREAST SURGERY Bilateral    BENIGN TUMORS  . CHONDROPLASTY Right 04/01/2016   Procedure: CHONDROPLASTY -ABRASION OF FEMORAL TROCHLEA, LATERAL TIBIA Plateau;  Surgeon: Corky Mull, MD;  Location: [facility] ORS;  Service: Orthopedics;  Laterality: Right;  . KNEE ARTHROSCOPY WITH MENISCAL REPAIR Right 04/01/2016   Procedure: KNEE ARTHROSCOPY WITH PARTIAL MEDIAL  MENISCAL REPAIR;  Surgeon: Corky Mull, MD;  Location: [facility] ORS;  Service: Orthopedics;  Laterality: Right;  . TUBAL LIGATION    . TUMOR EXCISION     BENIGN-HEAD    Medical History: Past Medical History:  Diagnosis Date  . Adopted   . Arthritis  BOTH KNEES  . Asthma   . Benign tumor   . Dyspnea    VERY RARE  . Dysrhythmia    IRREGULAR PER PT  . Headache   . Hypertension   . Myocardial infarction ([place])    AGE 3    Family History: Family History  Adopted: Yes    Social History   Socioeconomic History  . Marital status: Single    Spouse name: Not on file  . Number of children: Not on file  . Years of education: Not on file  . Highest education level: Not on file  Occupational History  .  Not on file  Tobacco Use  . Smoking status: Never Smoker  . Smokeless tobacco: Never Used  Substance and Sexual Activity  . Alcohol use: No  . Drug use: No  . Sexual activity: Not on file  Other Topics Concern  . Not on file  Social History Narrative  . Not on file   Social Determinants of Health   Financial Resource Strain:   . Difficulty of Paying Living Expenses:   Food Insecurity:   . Worried About Charity fundraiser in the Last Year:   . Arboriculturist in the Last Year:   Transportation Needs:   . Film/video editor (Medical):   Marland Kitchen Lack of Transportation (Non-Medical):   Physical Activity:   . Days of Exercise per Week:   . Minutes of Exercise per Session:   Stress:   . Feeling of Stress :   Social Connections:   . Frequency of Communication with Friends and Family:   . Frequency of Social Gatherings with Friends and Family:   . Attends Religious Services:   . Active Member of Clubs or Organizations:   . Attends Archivist Meetings:   Marland Kitchen Marital Status:   Intimate Partner Violence:   . Fear of Current or Ex-Partner:   . Emotionally Abused:   Marland Kitchen Physically Abused:   . Sexually Abused:       Review of Systems  Constitutional: Negative for activity change, chills, fatigue and unexpected weight change.       Ten pound weight loss since her last visit   HENT: Positive for dental problem. Negative for congestion, postnasal drip, rhinorrhea, sneezing and sore throat.   Respiratory: Negative for cough, chest tightness, shortness of breath and wheezing.   Cardiovascular: Negative for chest pain and palpitations.       Improved blood pressure   Gastrointestinal: Negative for abdominal pain, constipation, diarrhea, nausea and vomiting.  Endocrine: Negative for cold intolerance, heat intolerance, polydipsia and polyuria.       Blood sugars are improved   Musculoskeletal: Negative for arthralgias, back pain, joint swelling and neck pain.  Skin: Negative for  rash.  Allergic/Immunologic: Negative for environmental allergies.  Neurological: Negative for dizziness, tremors, numbness and headaches.  Hematological: Negative for adenopathy. Does not bruise/bleed easily.  Psychiatric/Behavioral: Negative for behavioral problems (Depression), sleep disturbance and suicidal ideas. The patient is not nervous/anxious.     Today's Vitals   07/31/19 0859  BP: 140/87  Pulse: 96  Resp: 16  Temp: (!) 97.2 F (36.2 C)  SpO2: 98%  Weight: 283 lb 6.4 oz (128.5 kg)  Height: 5\' 9"  (1.753 m)   Body mass index is 41.85 kg/m.  Physical Exam Vitals and nursing note reviewed.  Constitutional:      General: She is not in acute distress.    Appearance: Normal appearance. She is well-developed.  She is obese. She is not diaphoretic.  HENT:     Head: Normocephalic and atraumatic.     Mouth/Throat:     Pharynx: No oropharyngeal exudate.  Eyes:     Pupils: Pupils are equal, round, and reactive to light.  Neck:     Thyroid: No thyromegaly.     Vascular: No carotid bruit or JVD.     Trachea: No tracheal deviation.  Cardiovascular:     Rate and Rhythm: Normal rate and regular rhythm.     Heart sounds: Normal heart sounds. No murmur. No friction rub. No gallop.      Comments: Mild, bilateral dependant edema Pulmonary:     Effort: Pulmonary effort is normal. No respiratory distress.     Breath sounds: Normal breath sounds. No wheezing or rales.  Chest:     Chest wall: No tenderness.  Abdominal:     Palpations: Abdomen is soft.  Musculoskeletal:        General: Normal range of motion.     Cervical back: Normal range of motion and neck supple.  Lymphadenopathy:     Cervical: No cervical adenopathy.  Skin:    General: Skin is warm and dry.  Neurological:     Mental Status: She is alert and oriented to person, place, and time.     Cranial Nerves: No cranial nerve deficit.  Psychiatric:        Mood and Affect: Mood normal.        Behavior: Behavior  normal.        Thought Content: Thought content normal.        Judgment: Judgment normal.    Assessment/Plan:  1. Type 2 diabetes mellitus with hyperglycemia, without long-term current use of insulin (HCC) - POCT HgB A1C 6.5 today. Continue with metformin 500mg  twice daily. Continue with diet changes made since new diagnosis of diabetes.  - metFORMIN (GLUCOPHAGE) 500 MG tablet; Take 1 tablet (500 mg total) by mouth 2 (two) times daily with a meal.  Dispense: 60 tablet; Refill: 3  2. Essential hypertension Stable. Continue metoprolol and HCTZ as prescribed  - metoprolol tartrate (LOPRESSOR) 50 MG tablet; Take 1 tablet (50 mg total) by mouth 2 (two) times daily.  Dispense: 60 tablet; Refill: 3  3. B12 deficiency Vitamin b12 injection administered today.  - cyanocobalamin ((VITAMIN B-12)) injection 1,000 mcg  4. Morbid obesity ([place]) Improving. May continue phentermine 37.5mg  tablets. Limit calorie intake to 1200-1500 calories per [place] and incorporate exercise into daily routine.  - phentermine (ADIPEX-P) 37.5 MG tablet; Take 1 tablet (37.5 mg total) by mouth daily before breakfast.  Dispense: 30 tablet; Refill: 0  5. Tooth pain May taketylenol/codeine 300/30mg  tablets up to every 6 hours as needed for pain. Reviewed risks and possible side effects associated with taking opiates, benzodiazepines and other CNS depressants. Combination of these could cause dizziness and drowsiness. Advised patient not to drive or operate machinery when taking these medications, as patient's and other's life can be at risk and will have consequences. Patient verbalized understanding in this matter. Dependence and abuse for these drugs will be monitored closely. A Controlled substance policy and procedure is on file which allows [facility] to order a urine drug screen test at any visit. Patient understands and agrees with the plan. Patient to follow up with her dentist as scheduled.  -  Acetaminophen-Codeine (TYLENOL/CODEINE #3) 300-30 MG tablet; Take 1 tablet by mouth every 6 (six) hours as needed for pain.  Dispense: 20 tablet;  Refill: 0    General Counseling: Sukanya verbalizes understanding of the findings of todays visit and agrees with plan of treatment. I have discussed any further diagnostic evaluation that may be needed or ordered today. We also reviewed her medications today. she has been encouraged to call the office with any questions or concerns that should arise related to todays visit.   Diabetes Counseling:  1. Addition of ACE inh/ ARB'S for nephroprotection. Microalbumin is updated  2. Diabetic foot care, prevention of complications. Podiatry consult 3. Exercise and lose weight.  4. Diabetic eye examination, Diabetic eye exam is updated  5. Monitor blood sugar closlely. nutrition counseling.  6. Sign and symptoms of hypoglycemia including shaking sweating,confusion and headaches.   This patient was seen by Leretha Pol FNP Collaboration with Dr Lavera Guise as a part of collaborative care agreement  Orders Placed This Encounter  Procedures  . POCT HgB A1C    Meds ordered this encounter  Medications  . cyanocobalamin ((VITAMIN B-12)) injection 1,000 mcg  . Acetaminophen-Codeine (TYLENOL/CODEINE #3) 300-30 MG tablet    Sig: Take 1 tablet by mouth every 6 (six) hours as needed for pain.    Dispense:  20 tablet    Refill:  0    Order Specific Question:   Supervising Provider    Answer:   Lavera Guise [id number]  . phentermine (ADIPEX-P) 37.5 MG tablet    Sig: Take 1 tablet (37.5 mg total) by mouth daily before breakfast.    Dispense:  30 tablet    Refill:  0    Order Specific Question:   Supervising Provider    Answer:   Lavera Guise [place]  . metFORMIN (GLUCOPHAGE) 500 MG tablet    Sig: Take 1 tablet (500 mg total) by mouth 2 (two) times daily with a meal.    Dispense:  60 tablet    Refill:  3    Order Specific Question:   Supervising Provider     Answer:   Lavera Guise [place]  . metoprolol tartrate (LOPRESSOR) 50 MG tablet    Sig: Take 1 tablet (50 mg total) by mouth 2 (two) times daily.    Dispense:  60 tablet    Refill:  3    Order Specific Question:   Supervising Provider    Answer:   Lavera Guise [id number]    Total time spent: 30 Minutes   Time spent includes review of chart, medications, test results, and follow up plan with the patient.      Dr Lavera Guise Internal medicine

## 2019-08-28 ENCOUNTER — Telehealth: Payer: Self-pay

## 2019-08-28 NOTE — Telephone Encounter (Signed)
[name] to confirm and screen for 08-30-19 ov.

## 2019-08-29 ENCOUNTER — Ambulatory Visit: Payer: Self-pay | Admitting: Physician Assistant

## 2019-08-30 ENCOUNTER — Encounter: Payer: Self-pay | Admitting: Physician Assistant

## 2019-08-30 ENCOUNTER — Ambulatory Visit: Payer: 59 | Admitting: Nurse Practitioner

## 2019-08-30 ENCOUNTER — Encounter: Payer: Self-pay | Admitting: Nurse Practitioner

## 2019-08-30 ENCOUNTER — Other Ambulatory Visit: Payer: Self-pay

## 2019-08-30 VITALS — BP 142/82 | HR 93 | Temp 97.4°F | Resp 16 | Ht 69.0 in | Wt 281.6 lb

## 2019-08-30 DIAGNOSIS — I10 Essential (primary) hypertension: Secondary | ICD-10-CM

## 2019-08-30 DIAGNOSIS — L700 Acne vulgaris: Secondary | ICD-10-CM

## 2019-08-30 DIAGNOSIS — E1165 Type 2 diabetes mellitus with hyperglycemia: Secondary | ICD-10-CM

## 2019-08-30 DIAGNOSIS — E538 Deficiency of other specified B group vitamins: Secondary | ICD-10-CM | POA: Diagnosis not present

## 2019-08-30 DIAGNOSIS — M25571 Pain in right ankle and joints of right foot: Secondary | ICD-10-CM

## 2019-08-30 MED ORDER — METFORMIN HCL 500 MG PO TABS: 500.0000 mg | ORAL_TABLET | Freq: Two times a day (BID) | ORAL | 3 refills | Status: DC

## 2019-08-30 MED ORDER — PHENTERMINE HCL 37.5 MG PO TABS: 37.5000 mg | ORAL_TABLET | Freq: Every day | ORAL | 0 refills | Status: DC

## 2019-08-30 MED ORDER — METOPROLOL TARTRATE 50 MG PO TABS: 50.0000 mg | ORAL_TABLET | Freq: Two times a day (BID) | ORAL | 3 refills | Status: DC

## 2019-08-30 MED ORDER — CYANOCOBALAMIN 1000 MCG/ML IJ SOLN
1000.0000 ug | Freq: Once | INTRAMUSCULAR | Status: AC
  Administered 2019-08-30: 1000 ug via INTRAMUSCULAR

## 2019-08-30 MED ORDER — DOXYCYCLINE HYCLATE 100 MG PO TABS: 100.0000 mg | ORAL_TABLET | Freq: Two times a day (BID) | ORAL | 0 refills | Status: DC

## 2019-08-30 MED ORDER — ACETAMINOPHEN-CODEINE #3 300-30 MG PO TABS: 1.0000 | ORAL_TABLET | Freq: Four times a day (QID) | ORAL | 0 refills | Status: DC | PRN

## 2019-08-30 NOTE — Progress Notes (Signed)
[facility] [place], [place] [address]  Internal MEDICINE  Office Visit Note  Patient Name: Destiny Singh  [id number]  [id number]  Date of Service: 08/30/2019  Chief Complaint  Patient presents with  . Follow-up    Weight Management  . Foot Pain    Right ankle swollen and painful; hurts to walk on it    The patient is here for follow up.  -blood sugars doing well -having right foot and ankle tenderness.  States that when she initially gets up from a seated position and begins activity. As she is up for a little while it does get a little better. After she has been on her feet for a while, the pain gets worse again. There is no swelling or deformity. States that she has not injured her foot or ankle at all.  -taking phentermine for weight management. Has lost two pounds since she was last seen. Blood pressure mildly elevated today -worsening acne. Has been on doxycycline in the past to help.        Current Medication: Outpatient Encounter Medications as of 08/30/2019  Medication Sig  . hydrochlorothiazide (HYDRODIURIL) 12.5 MG tablet Take 1 tablet (12.5 mg total) by mouth daily.  . metFORMIN (GLUCOPHAGE) 500 MG tablet Take 1 tablet (500 mg total) by mouth 2 (two) times daily with a meal.  . metoprolol tartrate (LOPRESSOR) 50 MG tablet Take 1 tablet (50 mg total) by mouth 2 (two) times daily.  . phentermine (ADIPEX-P) 37.5 MG tablet Take 1 tablet (37.5 mg total) by mouth daily before breakfast.  . [DISCONTINUED] Acetaminophen-Codeine (TYLENOL/CODEINE #3) 300-30 MG tablet Take 1 tablet by mouth every 6 (six) hours as needed for pain.  . [DISCONTINUED] amoxicillin (AMOXIL) 875 MG tablet Take 1 tablet (875 mg total) by mouth 2 (two) times daily.  . [DISCONTINUED] phentermine (ADIPEX-P) 37.5 MG tablet Take 1 tablet (37.5 mg total) by mouth daily before breakfast.  . acetaminophen-codeine (TYLENOL #3) 300-30 MG tablet Take 1 tablet by mouth every 6 (six) hours as  needed for moderate pain.  Marland Kitchen doxycycline (VIBRA-TABS) 100 MG tablet Take 1 tablet (100 mg total) by mouth 2 (two) times daily.  . [EXPIRED] cyanocobalamin ((VITAMIN B-12)) injection 1,000 mcg    No facility-administered encounter medications on file as of 08/30/2019.    Surgical History: Past Surgical History:  Procedure Laterality Date  . BREAST SURGERY Bilateral    BENIGN TUMORS  . CHONDROPLASTY Right 04/01/2016   Procedure: CHONDROPLASTY -ABRASION OF FEMORAL TROCHLEA, LATERAL TIBIA Plateau;  Surgeon: Corky Mull, MD;  Location: [facility] ORS;  Service: Orthopedics;  Laterality: Right;  . KNEE ARTHROSCOPY WITH MENISCAL REPAIR Right 04/01/2016   Procedure: KNEE ARTHROSCOPY WITH PARTIAL MEDIAL  MENISCAL REPAIR;  Surgeon: Corky Mull, MD;  Location: [facility] ORS;  Service: Orthopedics;  Laterality: Right;  . TUBAL LIGATION    . TUMOR EXCISION     BENIGN-HEAD    Medical History: Past Medical History:  Diagnosis Date  . Adopted   . Arthritis    BOTH KNEES  . Asthma   . Benign tumor   . Dyspnea    VERY RARE  . Dysrhythmia    IRREGULAR PER PT  . Headache   . Hypertension   . Myocardial infarction ([place])    AGE 26    Family History: Family History  Adopted: Yes    Social History   Socioeconomic History  . Marital status: Single    Spouse name: Not on file  .  Number of children: Not on file  . Years of education: Not on file  . Highest education level: Not on file  Occupational History  . Not on file  Tobacco Use  . Smoking status: Never Smoker  . Smokeless tobacco: Never Used  Substance and Sexual Activity  . Alcohol use: No  . Drug use: No  . Sexual activity: Not on file  Other Topics Concern  . Not on file  Social History Narrative  . Not on file   Social Determinants of Health   Financial Resource Strain:   . Difficulty of Paying Living Expenses:   Food Insecurity:   . Worried About Charity fundraiser in the Last Year:   . Arboriculturist in the Last Year:    Transportation Needs:   . Film/video editor (Medical):   Marland Kitchen Lack of Transportation (Non-Medical):   Physical Activity:   . Days of Exercise per Week:   . Minutes of Exercise per Session:   Stress:   . Feeling of Stress :   Social Connections:   . Frequency of Communication with Friends and Family:   . Frequency of Social Gatherings with Friends and Family:   . Attends Religious Services:   . Active Member of Clubs or Organizations:   . Attends Archivist Meetings:   Marland Kitchen Marital Status:   Intimate Partner Violence:   . Fear of Current or Ex-Partner:   . Emotionally Abused:   Marland Kitchen Physically Abused:   . Sexually Abused:       Review of Systems  Constitutional: Negative for activity change, chills, fatigue and unexpected weight change.       Two pound weight loss since her last visit.   HENT: Negative for congestion, dental problem, postnasal drip, rhinorrhea, sneezing and sore throat.   Respiratory: Negative for cough, chest tightness, shortness of breath and wheezing.   Cardiovascular: Negative for chest pain and palpitations.       Improved blood pressure   Gastrointestinal: Negative for abdominal pain, constipation, diarrhea, nausea and vomiting.  Endocrine: Negative for cold intolerance, heat intolerance, polydipsia and polyuria.       Blood sugars are improved   Musculoskeletal: Positive for arthralgias and joint swelling. Negative for back pain and neck pain.       Right ankle and foot pain and swelling. Pain is along the lateral and medial aspect of the lower leg from ankle to mid lower leg.   Skin: Negative for rash.  Allergic/Immunologic: Negative for environmental allergies.  Neurological: Negative for dizziness, tremors, numbness and headaches.  Hematological: Negative for adenopathy. Does not bruise/bleed easily.  Psychiatric/Behavioral: Negative for behavioral problems (Depression), sleep disturbance and suicidal ideas. The patient is not nervous/anxious.      Today's Vitals   08/30/19 0951  BP: (!) 142/82  Pulse: 93  Resp: 16  Temp: (!) 97.4 F (36.3 C)  SpO2: 98%  Weight: 281 lb 9.6 oz (127.7 kg)  Height: 5\' 9"  (1.753 m)   Body mass index is 41.59 kg/m.  Physical Exam Vitals and nursing note reviewed.  Constitutional:      General: She is not in acute distress.    Appearance: Normal appearance. She is well-developed. She is obese. She is not diaphoretic.  HENT:     Head: Normocephalic and atraumatic.     Mouth/Throat:     Pharynx: No oropharyngeal exudate.  Eyes:     Pupils: Pupils are equal, round, and reactive to light.  Neck:     Thyroid: No thyromegaly.     Vascular: No carotid bruit or JVD.     Trachea: No tracheal deviation.  Cardiovascular:     Rate and Rhythm: Normal rate and regular rhythm.     Heart sounds: Normal heart sounds. No murmur heard.  No friction rub. No gallop.      Comments: Mild, bilateral dependant edema Pulmonary:     Effort: Pulmonary effort is normal. No respiratory distress.     Breath sounds: Normal breath sounds. No wheezing or rales.  Chest:     Chest wall: No tenderness.  Abdominal:     Palpations: Abdomen is soft.  Musculoskeletal:        General: Swelling and tenderness present. Normal range of motion.     Cervical back: Normal range of motion and neck supple.     Right lower leg: Edema present.     Comments: There is mild swelling around the right ankle. Tenderness with moderate palpation. She has intact ROM and strength while seated. Standing up with weight on right foot causes significant pain. She walks with noticeable limp favoring the right side.   Lymphadenopathy:     Cervical: No cervical adenopathy.  Skin:    General: Skin is warm and dry.  Neurological:     Mental Status: She is alert and oriented to person, place, and time.     Cranial Nerves: No cranial nerve deficit.  Psychiatric:        Mood and Affect: Mood normal.        Behavior: Behavior normal.         Thought Content: Thought content normal.        Judgment: Judgment normal.    Assessment/Plan: 1. Essential hypertension Stable. ocnitnue bp medication as prescribed   2. Type 2 diabetes mellitus with hyperglycemia, without long-term current use of insulin (HCC) Improved blood sugars. Continue to monitor closely.   3. B12 deficiency Vitamin b12 injection administered today.  - cyanocobalamin ((VITAMIN B-12)) injection 1,000 mcg  4. Morbid obesity ([place]) Improving. May continue phentermine daily. Limit calorie intake to 1200-1500 calories per [place]. Incorporate exercise into daily routine.  - phentermine (ADIPEX-P) 37.5 MG tablet; Take 1 tablet (37.5 mg total) by mouth daily before breakfast.  Dispense: 30 tablet; Refill: 0  5. Acne vulgaris May take doxycycline twice daily for 14 days due to acne flare.  - doxycycline (VIBRA-TABS) 100 MG tablet; Take 1 tablet (100 mg total) by mouth 2 (two) times daily.  Dispense: 28 tablet; Refill: 0  6. Acute right ankle pain Advised she Apply a compressive ACE bandage. Rest and elevate the affected painful area.  Apply cold compresses intermittently as needed.  As pain recedes, begin normal activities slowly as tolerated.  Take previously prescribed ibuprofen as needed. May take tylenol #3 as needed and as prescribed. New prescription for #30 tablets sent to her pharmacy. Will refer to podiatry as indicated.  - acetaminophen-codeine (TYLENOL #3) 300-30 MG tablet; Take 1 tablet by mouth every 6 (six) hours as needed for moderate pain.  Dispense: 30 tablet; Refill: 0  General Counseling: Destiny Singh verbalizes understanding of the findings of todays visit and agrees with plan of treatment. I have discussed any further diagnostic evaluation that may be needed or ordered today. We also reviewed her medications today. she has been encouraged to call the office with any questions or concerns that should arise related to todays visit.   This patient was seen by  Nira Conn  [place] with Dr Lavera Guise as a part of collaborative care agreement  Meds ordered this encounter  Medications  . cyanocobalamin ((VITAMIN B-12)) injection 1,000 mcg  . doxycycline (VIBRA-TABS) 100 MG tablet    Sig: Take 1 tablet (100 mg total) by mouth 2 (two) times daily.    Dispense:  28 tablet    Refill:  0    Order Specific Question:   Supervising Provider    Answer:   Lavera Guise [[id number]]  . phentermine (ADIPEX-P) 37.5 MG tablet    Sig: Take 1 tablet (37.5 mg total) by mouth daily before breakfast.    Dispense:  30 tablet    Refill:  0    Order Specific Question:   Supervising Provider    Answer:   Lavera Guise [address]  . acetaminophen-codeine (TYLENOL #3) 300-30 MG tablet    Sig: Take 1 tablet by mouth every 6 (six) hours as needed for moderate pain.    Dispense:  30 tablet    Refill:  0    Order Specific Question:   Supervising Provider    Answer:   Lavera Guise [[id number]]    Total time spent: 30 Minutes   Time spent includes review of chart, medications, test results, and follow up plan with the patient.      Dr Lavera Guise Internal medicine

## 2019-09-26 ENCOUNTER — Telehealth: Payer: Self-pay

## 2019-09-26 NOTE — Telephone Encounter (Signed)
[name] to confirm and screen for 09-28-19 ov.

## 2019-09-27 ENCOUNTER — Telehealth: Payer: Self-pay

## 2019-09-27 NOTE — Telephone Encounter (Signed)
[name] advising patient 09-28-19 appt was rescheduled to 10-01-19. Asked patient to call nad go over Covid screening questions prior to appt.

## 2019-09-28 ENCOUNTER — Ambulatory Visit: Payer: Self-pay | Admitting: Nurse Practitioner

## 2019-10-01 ENCOUNTER — Ambulatory Visit: Payer: Self-pay | Admitting: Nurse Practitioner

## 2019-10-04 ENCOUNTER — Other Ambulatory Visit: Payer: Self-pay

## 2019-10-04 DIAGNOSIS — L700 Acne vulgaris: Secondary | ICD-10-CM

## 2019-10-04 MED ORDER — DOXYCYCLINE HYCLATE 100 MG PO TABS: 100.0000 mg | ORAL_TABLET | Freq: Two times a day (BID) | ORAL | 0 refills | Status: DC

## 2019-10-09 ENCOUNTER — Telehealth: Payer: Self-pay

## 2019-10-09 NOTE — Telephone Encounter (Signed)
[name] to confirm and screen for 10-11-19 ov. 

## 2019-10-11 ENCOUNTER — Ambulatory Visit: Payer: Self-pay | Admitting: Nurse Practitioner

## 2019-10-18 ENCOUNTER — Telehealth: Payer: Self-pay

## 2019-10-18 NOTE — Telephone Encounter (Signed)
[name] to confirm and screen for 10-22-19 ov.

## 2019-10-19 ENCOUNTER — Ambulatory Visit: Payer: Self-pay | Admitting: Nurse Practitioner

## 2019-10-22 ENCOUNTER — Ambulatory Visit: Payer: 59 | Admitting: Nurse Practitioner

## 2019-10-22 ENCOUNTER — Encounter: Payer: Self-pay | Admitting: Nurse Practitioner

## 2019-10-22 ENCOUNTER — Other Ambulatory Visit: Payer: Self-pay

## 2019-10-22 VITALS — BP 154/96 | HR 99 | Temp 97.5°F | Resp 16 | Ht 69.0 in | Wt 283.0 lb

## 2019-10-22 DIAGNOSIS — E538 Deficiency of other specified B group vitamins: Secondary | ICD-10-CM

## 2019-10-22 DIAGNOSIS — E1165 Type 2 diabetes mellitus with hyperglycemia: Secondary | ICD-10-CM | POA: Diagnosis not present

## 2019-10-22 DIAGNOSIS — I10 Essential (primary) hypertension: Secondary | ICD-10-CM

## 2019-10-22 DIAGNOSIS — M25571 Pain in right ankle and joints of right foot: Secondary | ICD-10-CM

## 2019-10-22 MED ORDER — CYANOCOBALAMIN 1000 MCG/ML IJ SOLN
1000.0000 ug | Freq: Once | INTRAMUSCULAR | Status: AC
  Administered 2019-10-22: 1000 ug via INTRAMUSCULAR

## 2019-10-22 MED ORDER — HYDROCHLOROTHIAZIDE 25 MG PO TABS: 25.0000 mg | ORAL_TABLET | Freq: Every day | ORAL | 3 refills | Status: DC

## 2019-10-22 NOTE — Progress Notes (Signed)
[facility] [place], [place] [address]  Internal MEDICINE  Office Visit Note  Patient Name: Destiny Singh  [id number]  [id number]  Date of Service: 11/04/2019  Chief Complaint  Patient presents with  . Follow-up    wt management   . Leg Pain    plantarfasciatis    The patient is here for routine follow up. She continues to have  right foot and ankle tenderness.  States that when she initially gets up from a seated position and begins activity. As she is up for a little while it does get a little better. After she has been on her feet for a while, the pain gets worse again. Ankles swells significantly during the [place]. Pain is getting so bad, she can hardly walk. She is wearing a brace for the ankle. She states that it is not really helping.  Blood pressure somewhat elevated today. Feels swelling in her ankles and legs . Trial of phentermine unsuccessful. Weight gain two pounds since her last visit.       Current Medication: Outpatient Encounter Medications as of 10/22/2019  Medication Sig  . acetaminophen-codeine (TYLENOL #3) 300-30 MG tablet Take 1 tablet by mouth every 6 (six) hours as needed for moderate pain.  Marland Kitchen doxycycline (VIBRA-TABS) 100 MG tablet Take 1 tablet (100 mg total) by mouth 2 (two) times daily.  . hydrochlorothiazide (HYDRODIURIL) 25 MG tablet Take 1 tablet (25 mg total) by mouth daily.  . metFORMIN (GLUCOPHAGE) 500 MG tablet Take 1 tablet (500 mg total) by mouth 2 (two) times daily with a meal.  . metoprolol tartrate (LOPRESSOR) 50 MG tablet Take 1 tablet (50 mg total) by mouth 2 (two) times daily.  . [DISCONTINUED] hydrochlorothiazide (HYDRODIURIL) 12.5 MG tablet Take 1 tablet (12.5 mg total) by mouth daily.  . [DISCONTINUED] phentermine (ADIPEX-P) 37.5 MG tablet Take 1 tablet (37.5 mg total) by mouth daily before breakfast.  . [EXPIRED] cyanocobalamin ((VITAMIN B-12)) injection 1,000 mcg    No facility-administered encounter medications  on file as of 10/22/2019.    Surgical History: Past Surgical History:  Procedure Laterality Date  . BREAST SURGERY Bilateral    BENIGN TUMORS  . CHONDROPLASTY Right 04/01/2016   Procedure: CHONDROPLASTY -ABRASION OF FEMORAL TROCHLEA, LATERAL TIBIA Plateau;  Surgeon: Corky Mull, MD;  Location: [facility] ORS;  Service: Orthopedics;  Laterality: Right;  . KNEE ARTHROSCOPY WITH MENISCAL REPAIR Right 04/01/2016   Procedure: KNEE ARTHROSCOPY WITH PARTIAL MEDIAL  MENISCAL REPAIR;  Surgeon: Corky Mull, MD;  Location: [facility] ORS;  Service: Orthopedics;  Laterality: Right;  . TUBAL LIGATION    . TUMOR EXCISION     BENIGN-HEAD    Medical History: Past Medical History:  Diagnosis Date  . Adopted   . Arthritis    BOTH KNEES  . Asthma   . Benign tumor   . Dyspnea    VERY RARE  . Dysrhythmia    IRREGULAR PER PT  . Headache   . Hypertension   . Myocardial infarction ([place])    AGE 4    Family History: Family History  Adopted: Yes    Social History   Socioeconomic History  . Marital status: Single    Spouse name: Not on file  . Number of children: Not on file  . Years of education: Not on file  . Highest education level: Not on file  Occupational History  . Not on file  Tobacco Use  . Smoking status: Never Smoker  . Smokeless  tobacco: Never Used  Substance and Sexual Activity  . Alcohol use: No  . Drug use: No  . Sexual activity: Not on file  Other Topics Concern  . Not on file  Social History Narrative  . Not on file   Social Determinants of Health   Financial Resource Strain:   . Difficulty of Paying Living Expenses: Not on file  Food Insecurity:   . Worried About Charity fundraiser in the Last Year: Not on file  . Ran Out of Food in the Last Year: Not on file  Transportation Needs:   . Lack of Transportation (Medical): Not on file  . Lack of Transportation (Non-Medical): Not on file  Physical Activity:   . Days of Exercise per Week: Not on file  . Minutes of  Exercise per Session: Not on file  Stress:   . Feeling of Stress : Not on file  Social Connections:   . Frequency of Communication with Friends and Family: Not on file  . Frequency of Social Gatherings with Friends and Family: Not on file  . Attends Religious Services: Not on file  . Active Member of Clubs or Organizations: Not on file  . Attends Archivist Meetings: Not on file  . Marital Status: Not on file  Intimate Partner Violence:   . Fear of Current or Ex-Partner: Not on file  . Emotionally Abused: Not on file  . Physically Abused: Not on file  . Sexually Abused: Not on file      Review of Systems  Constitutional: Negative for activity change, chills, fatigue and unexpected weight change.       Tow pound weight gain since most recent visit.   HENT: Negative for congestion, dental problem, postnasal drip, rhinorrhea, sneezing and sore throat.   Respiratory: Negative for cough, chest tightness, shortness of breath and wheezing.   Cardiovascular: Positive for leg swelling. Negative for chest pain and palpitations.       Blood pressure elevated today  Gastrointestinal: Negative for abdominal pain, constipation, diarrhea, nausea and vomiting.  Endocrine: Negative for cold intolerance, heat intolerance, polydipsia and polyuria.       Blood sugars are improved   Musculoskeletal: Positive for arthralgias and joint swelling. Negative for back pain and neck pain.       Right ankle and foot pain and swelling. Pain is along the lateral and medial aspect of the lower leg from ankle to mid lower leg.   Skin: Negative for rash.  Allergic/Immunologic: Negative for environmental allergies.  Neurological: Negative for dizziness, tremors, numbness and headaches.  Hematological: Negative for adenopathy. Does not bruise/bleed easily.  Psychiatric/Behavioral: Negative for behavioral problems (Depression), sleep disturbance and suicidal ideas. The patient is not nervous/anxious.      Today's Vitals   10/22/19 1013  BP: (!) 154/96  Pulse: 99  Resp: 16  Temp: (!) 97.5 F (36.4 C)  SpO2: 97%  Weight: 283 lb (128.4 kg)  Height: 5\' 9"  (1.753 m)   Body mass index is 41.79 kg/m.  Physical Exam Vitals and nursing note reviewed.  Constitutional:      General: She is not in acute distress.    Appearance: Normal appearance. She is well-developed. She is obese. She is not diaphoretic.  HENT:     Head: Normocephalic and atraumatic.     Mouth/Throat:     Pharynx: No oropharyngeal exudate.  Eyes:     Pupils: Pupils are equal, round, and reactive to light.  Neck:  Thyroid: No thyromegaly.     Vascular: No carotid bruit or JVD.     Trachea: No tracheal deviation.  Cardiovascular:     Rate and Rhythm: Normal rate and regular rhythm.     Heart sounds: Normal heart sounds. No murmur heard.  No friction rub. No gallop.      Comments: Mild, bilateral dependant edema Pulmonary:     Effort: Pulmonary effort is normal. No respiratory distress.     Breath sounds: Normal breath sounds. No wheezing or rales.  Chest:     Chest wall: No tenderness.  Abdominal:     Palpations: Abdomen is soft.  Musculoskeletal:        General: Swelling and tenderness present. Normal range of motion.     Cervical back: Normal range of motion and neck supple.     Right lower leg: Edema present.     Comments: There is mild swelling around the right ankle. Tenderness with moderate palpation. She has intact ROM and strength while seated. Standing up with weight on right foot causes significant pain. She walks with noticeable limp favoring the right side.   Lymphadenopathy:     Cervical: No cervical adenopathy.  Skin:    General: Skin is warm and dry.  Neurological:     Mental Status: She is alert and oriented to person, place, and time.     Cranial Nerves: No cranial nerve deficit.  Psychiatric:        Mood and Affect: Mood normal.        Behavior: Behavior normal.        Thought  Content: Thought content normal.        Judgment: Judgment normal.    Assessment/Plan: 1. Essential hypertension Increased HCTZ to 25mg  tablets daily.  Continue metoprolol 50mg  as prescribed  - hydrochlorothiazide (HYDRODIURIL) 25 MG tablet; Take 1 tablet (25 mg total) by mouth daily.  Dispense: 30 tablet; Refill: 3  2. Type 2 diabetes mellitus with hyperglycemia, without long-term current use of insulin (HCC) Continue metformin as prescribed   3. B12 deficiency b12 injection administered today.  - cyanocobalamin ((VITAMIN B-12)) injection 1,000 mcg  4. Acute right ankle pain Will get x-ray or right ankle for further evaluation. Refer to orthopedics as indicated.  - DG Ankle Complete Right; Future  5. Morbid obesity ([place]) D/c phentermine for now. advied her to limit calorie intake to 1200 calories per [place] and gradually incorporate exercise into daily routine.   General Counseling: [place] verbalizes understanding of the findings of todays visit and agrees with plan of treatment. I have discussed any further diagnostic evaluation that may be needed or ordered today. We also reviewed her medications today. she has been encouraged to call the office with any questions or concerns that should arise related to todays visit.  Hypertension Counseling:   The following hypertensive lifestyle modification were recommended and discussed:  1. Limiting alcohol intake to less than 1 oz/[place] of ethanol:(24 oz of beer or 8 oz of wine or 2 oz of 100-proof whiskey). 2. Take baby ASA 81 mg daily. 3. Importance of regular aerobic exercise and losing weight. 4. Reduce dietary saturated fat and cholesterol intake for overall cardiovascular health. 5. Maintaining adequate dietary potassium, calcium, and magnesium intake. 6. Regular monitoring of the blood pressure. 7. Reduce sodium intake to less than 100 mmol/[place] (less than 2.3 gm of sodium or less than 6 gm of sodium choride)   This patient was seen by  [place] with  Dr Lavera Guise as a part of collaborative care agreement  Orders Placed This Encounter  Procedures  . DG Ankle Complete Right    Meds ordered this encounter  Medications  . cyanocobalamin ((VITAMIN B-12)) injection 1,000 mcg  . hydrochlorothiazide (HYDRODIURIL) 25 MG tablet    Sig: Take 1 tablet (25 mg total) by mouth daily.    Dispense:  30 tablet    Refill:  3    Please note increased dose ofHCTZ.    Order Specific Question:   Supervising Provider    Answer:   Lavera Guise [[id number]]    Total time spent: 30 Minutes   Time spent includes review of chart, medications, test results, and follow up plan with the patient.      Dr Lavera Guise Internal medicine

## 2019-10-23 ENCOUNTER — Ambulatory Visit
Admission: RE | Admit: 2019-10-23 | Discharge: 2019-10-23 | Disposition: A | Discharge status: Home / Self Care | Payer: Self-pay | Attending: Nurse Practitioner | Admitting: Nurse Practitioner

## 2019-10-23 ENCOUNTER — Ambulatory Visit
Admission: RE | Admit: 2019-10-23 | Discharge: 2019-10-23 | Disposition: A | Discharge status: Home / Self Care | Payer: Self-pay | Source: Ambulatory Visit | Attending: Nurse Practitioner | Admitting: Nurse Practitioner

## 2019-10-23 DIAGNOSIS — M25571 Pain in right ankle and joints of right foot: Secondary | ICD-10-CM

## 2019-10-24 ENCOUNTER — Telehealth: Payer: Self-pay

## 2019-10-24 NOTE — Progress Notes (Signed)
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 She also has a small heel spur. Does she want the be referred to podiatry for further evaluation and treatment?

## 2019-10-24 NOTE — Telephone Encounter (Signed)
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 She also has a small heel spur. Does she want the be referred to podiatry for further evaluation and treatment?

## 2019-10-24 NOTE — Telephone Encounter (Signed)
Pt advised for xray result and gave beth for referral

## 2019-11-04 DIAGNOSIS — M25571 Pain in right ankle and joints of right foot: Secondary | ICD-10-CM | POA: Insufficient documentation

## 2019-11-05 ENCOUNTER — Ambulatory Visit: Payer: Self-pay | Admitting: Podiatry

## 2019-12-03 ENCOUNTER — Telehealth: Payer: Self-pay

## 2019-12-03 ENCOUNTER — Ambulatory Visit: Payer: Self-pay | Admitting: Nurse Practitioner

## 2019-12-03 NOTE — Telephone Encounter (Signed)
Called Pt to advise of todays missed appt.  Pt has cancelled last 4 appt and no show today.  Need to try and reschedule one more time for discharging.

## 2019-12-10 DIAGNOSIS — S86111A Strain of other muscle(s) and tendon(s) of posterior muscle group at lower leg level, right leg, initial encounter: Secondary | ICD-10-CM | POA: Insufficient documentation

## 2019-12-19 ENCOUNTER — Other Ambulatory Visit: Payer: Self-pay | Admitting: Podiatry

## 2019-12-19 DIAGNOSIS — M7731 Calcaneal spur, right foot: Secondary | ICD-10-CM

## 2019-12-19 DIAGNOSIS — M79671 Pain in right foot: Secondary | ICD-10-CM

## 2019-12-19 DIAGNOSIS — S86111A Strain of other muscle(s) and tendon(s) of posterior muscle group at lower leg level, right leg, initial encounter: Secondary | ICD-10-CM

## 2019-12-20 ENCOUNTER — Ambulatory Visit: Payer: Self-pay

## 2019-12-24 ENCOUNTER — Other Ambulatory Visit: Payer: Self-pay

## 2019-12-24 ENCOUNTER — Encounter: Payer: Self-pay | Admitting: Nurse Practitioner

## 2019-12-24 ENCOUNTER — Ambulatory Visit: Payer: 59 | Admitting: Nurse Practitioner

## 2019-12-24 VITALS — BP 132/88 | HR 90 | Temp 97.5°F | Resp 16 | Ht 69.0 in | Wt 279.4 lb

## 2019-12-24 DIAGNOSIS — L209 Atopic dermatitis, unspecified: Secondary | ICD-10-CM

## 2019-12-24 DIAGNOSIS — E1165 Type 2 diabetes mellitus with hyperglycemia: Secondary | ICD-10-CM | POA: Diagnosis not present

## 2019-12-24 DIAGNOSIS — I10 Essential (primary) hypertension: Secondary | ICD-10-CM | POA: Diagnosis not present

## 2019-12-24 DIAGNOSIS — E538 Deficiency of other specified B group vitamins: Secondary | ICD-10-CM

## 2019-12-24 DIAGNOSIS — K439 Ventral hernia without obstruction or gangrene: Secondary | ICD-10-CM

## 2019-12-24 DIAGNOSIS — L700 Acne vulgaris: Secondary | ICD-10-CM

## 2019-12-24 DIAGNOSIS — R112 Nausea with vomiting, unspecified: Secondary | ICD-10-CM

## 2019-12-24 DIAGNOSIS — R1010 Upper abdominal pain, unspecified: Secondary | ICD-10-CM | POA: Diagnosis not present

## 2019-12-24 LAB — POCT GLYCOSYLATED HEMOGLOBIN (HGB A1C): Hemoglobin A1C: 6.6 % — AB (ref 4.0–5.6)

## 2019-12-24 MED ORDER — TRIAMCINOLONE ACETONIDE 0.025 % EX CREA: 1.0000 "application " | TOPICAL_CREAM | Freq: Two times a day (BID) | CUTANEOUS | 2 refills | Status: DC

## 2019-12-24 MED ORDER — DOXYCYCLINE HYCLATE 100 MG PO TABS: 100.0000 mg | ORAL_TABLET | Freq: Two times a day (BID) | ORAL | 0 refills | Status: DC

## 2019-12-24 MED ORDER — CYANOCOBALAMIN 1000 MCG/ML IJ SOLN
1000.0000 ug | Freq: Once | INTRAMUSCULAR | Status: AC
  Administered 2019-12-24: 1000 ug via INTRAMUSCULAR

## 2019-12-24 NOTE — Progress Notes (Signed)
[facility] [place], [place] [address]  Internal MEDICINE  Office Visit Note  Patient Name: Destiny Singh  [id number]  [id number]  Date of Service: 01/12/2020  Chief Complaint  Patient presents with  . Follow-up  . Hypertension  . Diabetes  . controlled substance form  . Quality Metric Gaps    flu, tetnaus, covid, foot exam    The patient is here for routine follow up. She does have hernia in center of her abdomen which is getting larger and starting to cause pain. Sticks out more when she is exerting herself. This causes increased pain. She has not had imaging of this hernia yet.  Blood pressure is well controlled. Her HgbA1c is 6.6 today. Her blood pressure and heart rate are both elevated today. She continues to get intermittent palpitations. She is worried about getting COVID 19 vaccine. She feels as though she is the only one at her job who has not been vaccinated, however, her employer has not mandated employees get vaccinated. She is afraid that vaccination may trigger reaction with palpitations and high blood pressure.       Current Medication: Outpatient Encounter Medications as of 12/24/2019  Medication Sig  . acetaminophen-codeine (TYLENOL #3) 300-30 MG tablet Take 1 tablet by mouth every 6 (six) hours as needed for moderate pain.  Marland Kitchen doxycycline (VIBRA-TABS) 100 MG tablet Take 1 tablet (100 mg total) by mouth 2 (two) times daily.  . hydrochlorothiazide (HYDRODIURIL) 25 MG tablet Take 1 tablet (25 mg total) by mouth daily.  . metFORMIN (GLUCOPHAGE) 500 MG tablet Take 1 tablet (500 mg total) by mouth 2 (two) times daily with a meal.  . metoprolol tartrate (LOPRESSOR) 50 MG tablet Take 1 tablet (50 mg total) by mouth 2 (two) times daily.  . [DISCONTINUED] doxycycline (VIBRA-TABS) 100 MG tablet Take 1 tablet (100 mg total) by mouth 2 (two) times daily.  Marland Kitchen triamcinolone (KENALOG) 0.025 % cream Apply 1 application topically 2 (two) times daily.  .  [EXPIRED] cyanocobalamin ((VITAMIN B-12)) injection 1,000 mcg    No facility-administered encounter medications on file as of 12/24/2019.    Surgical History: Past Surgical History:  Procedure Laterality Date  . BREAST SURGERY Bilateral    BENIGN TUMORS  . CHONDROPLASTY Right 04/01/2016   Procedure: CHONDROPLASTY -ABRASION OF FEMORAL TROCHLEA, LATERAL TIBIA Plateau;  Surgeon: Corky Mull, MD;  Location: [facility] ORS;  Service: Orthopedics;  Laterality: Right;  . KNEE ARTHROSCOPY WITH MENISCAL REPAIR Right 04/01/2016   Procedure: KNEE ARTHROSCOPY WITH PARTIAL MEDIAL  MENISCAL REPAIR;  Surgeon: Corky Mull, MD;  Location: [facility] ORS;  Service: Orthopedics;  Laterality: Right;  . TUBAL LIGATION    . TUMOR EXCISION     BENIGN-HEAD    Medical History: Past Medical History:  Diagnosis Date  . Adopted   . Arthritis    BOTH KNEES  . Asthma   . Benign tumor   . Dyspnea    VERY RARE  . Dysrhythmia    IRREGULAR PER PT  . Headache   . Hypertension   . Myocardial infarction ([place])    AGE 49    Family History: Family History  Adopted: Yes    Social History   Socioeconomic History  . Marital status: Single    Spouse name: Not on file  . Number of children: Not on file  . Years of education: Not on file  . Highest education level: Not on file  Occupational History  . Not on file  Tobacco Use  . Smoking status: Never Smoker  . Smokeless tobacco: Never Used  Substance and Sexual Activity  . Alcohol use: No  . Drug use: No  . Sexual activity: Not on file  Other Topics Concern  . Not on file  Social History Narrative  . Not on file   Social Determinants of Health   Financial Resource Strain:   . Difficulty of Paying Living Expenses: Not on file  Food Insecurity:   . Worried About Charity fundraiser in the Last Year: Not on file  . Ran Out of Food in the Last Year: Not on file  Transportation Needs:   . Lack of Transportation (Medical): Not on file  . Lack of  Transportation (Non-Medical): Not on file  Physical Activity:   . Days of Exercise per Week: Not on file  . Minutes of Exercise per Session: Not on file  Stress:   . Feeling of Stress : Not on file  Social Connections:   . Frequency of Communication with Friends and Family: Not on file  . Frequency of Social Gatherings with Friends and Family: Not on file  . Attends Religious Services: Not on file  . Active Member of Clubs or Organizations: Not on file  . Attends Archivist Meetings: Not on file  . Marital Status: Not on file  Intimate Partner Violence:   . Fear of Current or Ex-Partner: Not on file  . Emotionally Abused: Not on file  . Physically Abused: Not on file  . Sexually Abused: Not on file      Review of Systems  Constitutional: Negative for activity change, chills, fatigue and unexpected weight change.       Four pound weight gain since most recent visit   HENT: Negative for congestion, dental problem, postnasal drip, rhinorrhea, sneezing and sore throat.   Respiratory: Negative for cough, chest tightness, shortness of breath and wheezing.   Cardiovascular: Positive for leg swelling. Negative for chest pain and palpitations.       Improved blood pressure   Gastrointestinal: Negative for abdominal pain, constipation, diarrhea, nausea and vomiting.       Abdominal pain due to increasing size of ventral hernia.   Endocrine: Negative for cold intolerance, heat intolerance, polydipsia and polyuria.       Blood sugars are improved   Musculoskeletal: Positive for arthralgias and joint swelling. Negative for back pain and neck pain.       Right ankle and foot pain and swelling. Pain is along the lateral and medial aspect of the lower leg from ankle to mid lower leg.   Skin: Negative for rash.  Allergic/Immunologic: Negative for environmental allergies.  Neurological: Negative for dizziness, tremors, numbness and headaches.  Hematological: Negative for adenopathy.  Does not bruise/bleed easily.  Psychiatric/Behavioral: Negative for behavioral problems (Depression), sleep disturbance and suicidal ideas. The patient is not nervous/anxious.     Today's Vitals   12/24/19 0850  BP: 132/88  Pulse: 90  Resp: 16  Temp: (!) 97.5 F (36.4 C)  SpO2: 99%  Weight: 279 lb 6.4 oz (126.7 kg)  Height: 5\' 9"  (1.753 m)   Body mass index is 41.26 kg/m.  Physical Exam Vitals and nursing note reviewed.  Constitutional:      General: She is not in acute distress.    Appearance: Normal appearance. She is well-developed. She is obese. She is not diaphoretic.  HENT:     Head: Normocephalic and atraumatic.     Mouth/Throat:  Pharynx: No oropharyngeal exudate.  Eyes:     Pupils: Pupils are equal, round, and reactive to light.  Neck:     Thyroid: No thyromegaly.     Vascular: No carotid bruit or JVD.     Trachea: No tracheal deviation.  Cardiovascular:     Rate and Rhythm: Normal rate and regular rhythm.     Heart sounds: Normal heart sounds. No murmur heard.  No friction rub. No gallop.      Comments: Mild, bilateral dependant edema Pulmonary:     Effort: Pulmonary effort is normal. No respiratory distress.     Breath sounds: Normal breath sounds. No wheezing or rales.  Chest:     Chest wall: No tenderness.  Abdominal:     Palpations: Abdomen is soft.     Tenderness: There is abdominal tenderness.     Hernia: A hernia is present.     Comments: Large ventral hernia present. Gets larger with exertion. Tender with palpation.   Musculoskeletal:        General: Swelling and tenderness present. Normal range of motion.     Cervical back: Normal range of motion and neck supple.     Right lower leg: Edema present.     Comments: There is mild swelling around the right ankle. Tenderness with moderate palpation. She has intact ROM and strength while seated. Standing up with weight on right foot causes significant pain. She walks with noticeable limp favoring the  right side.   Lymphadenopathy:     Cervical: No cervical adenopathy.  Skin:    General: Skin is warm and dry.  Neurological:     Mental Status: She is alert and oriented to person, place, and time.     Cranial Nerves: No cranial nerve deficit.  Psychiatric:        Mood and Affect: Mood normal.        Behavior: Behavior normal.        Thought Content: Thought content normal.        Judgment: Judgment normal.    Assessment/Plan: 1. Type 2 diabetes mellitus with hyperglycemia, without long-term current use of insulin (HCC) - POCT HgB A1C 6.6 today. Continue diabetic medication as prescribed. Monitor blood sugars closely.   2. Essential hypertension Stable. Continue bp medication as prescribed   3. Pain of upper abdomen Likely due to increased size of ventral hernia. Will get CT scan for further evaluation and refer to surgery as indicated.  - CT ABDOMEN W CONTRAST; Future  4. Ventral hernia without obstruction or gangrene  Will get CT scan for further evaluation and refer to surgery as indicated.  - CT ABDOMEN W CONTRAST; Future  5. Acne vulgaris Restart doxycycline 100mg  twice daily for 14 days  - doxycycline (VIBRA-TABS) 100 MG tablet; Take 1 tablet (100 mg total) by mouth 2 (two) times daily.  Dispense: 28 tablet; Refill: 0  6. B12 deficiency b12 injectino administered today.  - cyanocobalamin ((VITAMIN B-12)) injection 1,000 mcg  7. Atopic dermatitis, unspecified type May use triamcinolone cream to effected areas twice daily as neede.d  - triamcinolone (KENALOG) 0.025 % cream; Apply 1 application topically 2 (two) times daily.  Dispense: 80 g; Refill: 2  General Counseling: Destiny Singh verbalizes understanding of the findings of todays visit and agrees with plan of treatment. I have discussed any further diagnostic evaluation that may be needed or ordered today. We also reviewed her medications today. she has been encouraged to call the office with any questions or concerns  that  should arise related to todays visit.  This patient was seen by Leretha Pol FNP Collaboration with Dr Lavera Guise as a part of collaborative care agreement  Orders Placed This Encounter  Procedures  . CT ABDOMEN W CONTRAST  . POCT HgB A1C    Meds ordered this encounter  Medications  . cyanocobalamin ((VITAMIN B-12)) injection 1,000 mcg  . doxycycline (VIBRA-TABS) 100 MG tablet    Sig: Take 1 tablet (100 mg total) by mouth 2 (two) times daily.    Dispense:  28 tablet    Refill:  0    Order Specific Question:   Supervising Provider    Answer:   Lavera Guise [[id number]]  . triamcinolone (KENALOG) 0.025 % cream    Sig: Apply 1 application topically 2 (two) times daily.    Dispense:  80 g    Refill:  2    Order Specific Question:   Supervising Provider    Answer:   Lavera Guise [[id number]]    Total time spent: 30 Minutes   Time spent includes review of chart, medications, test results, and follow up plan with the patient.      Dr Lavera Guise Internal medicine

## 2019-12-26 ENCOUNTER — Telehealth: Payer: Self-pay

## 2019-12-26 NOTE — Telephone Encounter (Signed)
Tried calling pt to advise of ct appt, unable to reach pt and unable to leave a message. Destiny Singh

## 2020-01-01 ENCOUNTER — Encounter: Payer: 59 | Admitting: Nurse Practitioner

## 2020-01-04 ENCOUNTER — Ambulatory Visit: Payer: Self-pay

## 2020-01-04 ENCOUNTER — Ambulatory Visit
Admission: RE | Admit: 2020-01-04 | Discharge: 2020-01-04 | Disposition: A | Discharge status: Home / Self Care | Payer: Self-pay | Source: Ambulatory Visit | Attending: Podiatry | Admitting: Podiatry

## 2020-01-04 ENCOUNTER — Other Ambulatory Visit: Payer: Self-pay

## 2020-01-04 DIAGNOSIS — S86111A Strain of other muscle(s) and tendon(s) of posterior muscle group at lower leg level, right leg, initial encounter: Secondary | ICD-10-CM | POA: Insufficient documentation

## 2020-01-04 DIAGNOSIS — M7731 Calcaneal spur, right foot: Secondary | ICD-10-CM | POA: Insufficient documentation

## 2020-01-04 DIAGNOSIS — M79671 Pain in right foot: Secondary | ICD-10-CM | POA: Insufficient documentation

## 2020-01-08 ENCOUNTER — Ambulatory Visit: Admission: RE | Admit: 2020-01-08 | Payer: Self-pay | Source: Ambulatory Visit

## 2020-01-12 DIAGNOSIS — R1010 Upper abdominal pain, unspecified: Secondary | ICD-10-CM | POA: Insufficient documentation

## 2020-01-12 DIAGNOSIS — K439 Ventral hernia without obstruction or gangrene: Secondary | ICD-10-CM | POA: Insufficient documentation

## 2020-01-12 DIAGNOSIS — L209 Atopic dermatitis, unspecified: Secondary | ICD-10-CM | POA: Insufficient documentation

## 2020-01-17 ENCOUNTER — Encounter: Payer: 59 | Admitting: Nurse Practitioner

## 2020-04-11 DIAGNOSIS — M1712 Unilateral primary osteoarthritis, left knee: Secondary | ICD-10-CM | POA: Insufficient documentation

## 2020-04-14 ENCOUNTER — Other Ambulatory Visit: Payer: Self-pay | Admitting: Surgery

## 2020-04-14 DIAGNOSIS — S83232A Complex tear of medial meniscus, current injury, left knee, initial encounter: Secondary | ICD-10-CM

## 2020-04-14 DIAGNOSIS — M1712 Unilateral primary osteoarthritis, left knee: Secondary | ICD-10-CM

## 2020-04-17 ENCOUNTER — Encounter: Payer: Self-pay | Admitting: Physician Assistant

## 2020-04-28 ENCOUNTER — Telehealth: Payer: Self-pay | Admitting: Cardiovascular Disease

## 2020-04-28 NOTE — Telephone Encounter (Signed)
3 attempts to schedule fu appt from recall list.   Deleting recall.   

## 2020-05-13 ENCOUNTER — Ambulatory Visit
Admission: RE | Admit: 2020-05-13 | Discharge: 2020-05-13 | Disposition: A | Discharge status: Home / Self Care | Payer: 59 | Source: Ambulatory Visit | Attending: Surgery | Admitting: Surgery

## 2020-05-13 ENCOUNTER — Other Ambulatory Visit: Payer: Self-pay

## 2020-05-13 DIAGNOSIS — M1712 Unilateral primary osteoarthritis, left knee: Secondary | ICD-10-CM | POA: Insufficient documentation

## 2020-05-13 DIAGNOSIS — S83232A Complex tear of medial meniscus, current injury, left knee, initial encounter: Secondary | ICD-10-CM | POA: Insufficient documentation

## 2020-05-26 ENCOUNTER — Ambulatory Visit (INDEPENDENT_AMBULATORY_CARE_PROVIDER_SITE_OTHER): Payer: 59 | Admitting: Physician Assistant

## 2020-05-26 ENCOUNTER — Other Ambulatory Visit: Payer: Self-pay

## 2020-05-26 ENCOUNTER — Encounter: Payer: Self-pay | Admitting: Physician Assistant

## 2020-05-26 DIAGNOSIS — Z0001 Encounter for general adult medical examination with abnormal findings: Secondary | ICD-10-CM | POA: Diagnosis not present

## 2020-05-26 DIAGNOSIS — E538 Deficiency of other specified B group vitamins: Secondary | ICD-10-CM | POA: Diagnosis not present

## 2020-05-26 DIAGNOSIS — I10 Essential (primary) hypertension: Secondary | ICD-10-CM | POA: Diagnosis not present

## 2020-05-26 DIAGNOSIS — M25562 Pain in left knee: Secondary | ICD-10-CM

## 2020-05-26 DIAGNOSIS — Z1231 Encounter for screening mammogram for malignant neoplasm of breast: Secondary | ICD-10-CM

## 2020-05-26 DIAGNOSIS — M79671 Pain in right foot: Secondary | ICD-10-CM

## 2020-05-26 DIAGNOSIS — R300 Dysuria: Secondary | ICD-10-CM | POA: Diagnosis not present

## 2020-05-26 DIAGNOSIS — Z1211 Encounter for screening for malignant neoplasm of colon: Secondary | ICD-10-CM

## 2020-05-26 DIAGNOSIS — Z1212 Encounter for screening for malignant neoplasm of rectum: Secondary | ICD-10-CM

## 2020-05-26 DIAGNOSIS — E1165 Type 2 diabetes mellitus with hyperglycemia: Secondary | ICD-10-CM | POA: Diagnosis not present

## 2020-05-26 LAB — POCT GLYCOSYLATED HEMOGLOBIN (HGB A1C): Hemoglobin A1C: 6.4 % — AB (ref 4.0–5.6)

## 2020-05-26 MED ORDER — METFORMIN HCL 500 MG PO TABS: 500.0000 mg | ORAL_TABLET | Freq: Two times a day (BID) | ORAL | 3 refills | Status: DC

## 2020-05-26 MED ORDER — METOPROLOL TARTRATE 50 MG PO TABS: 50.0000 mg | ORAL_TABLET | Freq: Two times a day (BID) | ORAL | 3 refills | Status: DC

## 2020-05-26 MED ORDER — CYANOCOBALAMIN 1000 MCG/ML IJ SOLN
1000.0000 ug | Freq: Once | INTRAMUSCULAR | Status: AC
  Administered 2020-05-26: 1000 ug via INTRAMUSCULAR

## 2020-05-26 NOTE — Progress Notes (Signed)
[facility] [place], [place] [address]  Internal MEDICINE  Office Visit Note  Patient Name: Destiny Singh  [id number]  [id number]  Date of Service: 05/27/2020  Chief Complaint  Patient presents with  . Annual Exam  . Asthma  . Hypertension  . Quality Metric Gaps    Covid vaccine,mammogram,colonoscopy     HPI Pt is here for routine health maintenance examination -She has many upcoming procedures. L knee surgery April 13; then will have R foot surgery for fallen arch sometime after that. Currently in a a walking boot. -Has been out of metformin and metoprolol for the last month and needs refills. 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 -BG at home last week was 185 after finishing work, had eaten before then and had not been taking metformin then. 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 Currently has one in head and L breast palpated mass today.  -also still feels ventral hernia--sometimes swells, despite whether she has eaten. Tender to touch-esp with bearing down. She states she never heard anything about when to go for CT which records show she had no showed for. Will reschedule today. -Was on wt loss med phentermine at one point, wants to get back on something but with so many upcoming surgeries and procedures will hold for now. Metabolic scan done previously and metabolism was good. Continue to improve diet/exercise as able. -Pt requests we hold off on doing foot exam until after foot surgery bc in boot and painful. -eye exam 2 years ago. Will need to schedule now. -She needs to be scheduled for mammogram and colonoscopy. -Due for labs, lab slip given  Current Medication: Outpatient Encounter Medications as of 05/26/2020  Medication Sig  . acetaminophen-codeine (TYLENOL #3) 300-30 MG tablet Take 1 tablet by mouth every 6 (six) hours as needed for moderate pain.  Marland Kitchen doxycycline (VIBRA-TABS) 100 MG tablet Take 1  tablet (100 mg total) by mouth 2 (two) times daily.  . hydrochlorothiazide (HYDRODIURIL) 25 MG tablet Take 1 tablet (25 mg total) by mouth daily.  Marland Kitchen triamcinolone (KENALOG) 0.025 % cream Apply 1 application topically 2 (two) times daily.  . [DISCONTINUED] metFORMIN (GLUCOPHAGE) 500 MG tablet Take 1 tablet (500 mg total) by mouth 2 (two) times daily with a meal.  . [DISCONTINUED] metoprolol tartrate (LOPRESSOR) 50 MG tablet Take 1 tablet (50 mg total) by mouth 2 (two) times daily.  . metFORMIN (GLUCOPHAGE) 500 MG tablet Take 1 tablet (500 mg total) by mouth 2 (two) times daily with a meal.  . metoprolol tartrate (LOPRESSOR) 50 MG tablet Take 1 tablet (50 mg total) by mouth 2 (two) times daily.  . [EXPIRED] cyanocobalamin ((VITAMIN B-12)) injection 1,000 mcg    No facility-administered encounter medications on file as of 05/26/2020.    Surgical History: Past Surgical History:  Procedure Laterality Date  . BREAST SURGERY Bilateral    BENIGN TUMORS  . CHONDROPLASTY Right 04/01/2016   Procedure: CHONDROPLASTY -ABRASION OF FEMORAL TROCHLEA, LATERAL TIBIA Plateau;  Surgeon: Corky Mull, MD;  Location: [facility] ORS;  Service: Orthopedics;  Laterality: Right;  . KNEE ARTHROSCOPY WITH MENISCAL REPAIR Right 04/01/2016   Procedure: KNEE ARTHROSCOPY WITH PARTIAL MEDIAL  MENISCAL REPAIR;  Surgeon: Corky Mull, MD;  Location: [facility] ORS;  Service: Orthopedics;  Laterality: Right;  . TUBAL LIGATION    . TUMOR EXCISION     BENIGN-HEAD    Medical History: Past Medical History:  Diagnosis Date  . Adopted   . Arthritis    BOTH KNEES  . Asthma   . Benign tumor   . Dyspnea    VERY RARE  . Dysrhythmia    IRREGULAR PER PT  . Headache   . Hypertension   . Myocardial infarction ([place])    AGE 50    Family History: Family History  Adopted: Yes      Review of Systems  Constitutional: Negative for chills, fatigue and unexpected weight change.  HENT: Negative for congestion, postnasal drip, rhinorrhea,  sneezing and sore throat.   Eyes: Negative for redness.  Respiratory: Negative for cough, chest tightness and shortness of breath.   Cardiovascular: Negative for chest pain and palpitations.  Gastrointestinal: Positive for abdominal distention and abdominal pain. Negative for constipation, diarrhea, nausea and vomiting.       Prev concern for ventral hernia with tender to touch and swelling at times  Genitourinary: Negative for dysuria and frequency.  Musculoskeletal: Positive for arthralgias. Negative for back pain, joint swelling and neck pain.       L knee, R foot pain  Skin: Negative for rash.  Neurological: Negative.  Negative for tremors, numbness and headaches.       Reports known tumor in back of head  Hematological: Negative for adenopathy. Does not bruise/bleed easily.  Psychiatric/Behavioral: Negative for behavioral problems (Depression), sleep disturbance and suicidal ideas. The patient is not nervous/anxious.      Vital Signs: BP (!) 148/84   Pulse 84   Temp 97.9 F (36.6 C)   Resp 16   Ht 5\' 9"  (1.753 m)   Wt 285 lb (129.3 kg)   SpO2 96%   BMI 42.09 kg/m    Physical Exam Vitals and nursing note reviewed.  Constitutional:      General: She is not in acute distress.    Appearance: She is well-developed. She is obese. She is not diaphoretic.  HENT:     Head: Normocephalic and atraumatic.     Right Ear: External ear normal.     Left Ear: External ear normal.     Nose: Nose normal.     Mouth/Throat:     Pharynx: No oropharyngeal exudate.  Eyes:     General: No scleral icterus.       Right eye: No discharge.        Left eye: No discharge.     Conjunctiva/sclera: Conjunctivae normal.     Pupils: Pupils are equal, round, and reactive to light.  Neck:     Thyroid: No thyromegaly.     Vascular: No JVD.     Trachea: No tracheal deviation.  Cardiovascular:     Rate and Rhythm: Normal rate and regular rhythm.     Heart sounds: Normal heart sounds. No murmur  heard. No friction rub. No gallop.   Pulmonary:     Effort: Pulmonary effort is normal. No respiratory distress.     Breath sounds: Normal breath sounds. No stridor. No wheezing or rales.  Chest:     Chest wall: No tenderness.  Breasts:     Right: Normal. No mass, nipple discharge or tenderness.     Left: Mass present. No nipple discharge or tenderness.     Abdominal:     General: Bowel sounds are normal. There is no distension.     Palpations: Abdomen is soft. There is no mass.     Tenderness: There is no abdominal tenderness. There is no guarding or rebound.  Genitourinary:    Comments: Declined pap today Musculoskeletal:        General: Signs of injury present. No tenderness or deformity. Normal range of motion.     Cervical back: Normal range of motion and neck supple.     Comments: L knee pain and R foot pain in walking boot currently  Feet:     Comments: Declined foot exam today; R foot in walking boot Lymphadenopathy:     Cervical: No cervical adenopathy.  Skin:    General: Skin is warm and dry.     Coloration: Skin is not pale.     Findings: No erythema or rash.  Neurological:     Mental Status: She is alert and oriented to person, place, and time.     Cranial Nerves: No cranial nerve deficit.     Motor: No abnormal muscle tone.     Coordination: Coordination normal.     Deep Tendon Reflexes: Reflexes are normal and symmetric.  Psychiatric:        Behavior: Behavior normal.        Thought Content: Thought content normal.        Judgment: Judgment normal.      LABS: Recent Results (from the past 2160 hour(s))  POCT HgB A1C     Status: Abnormal   Collection Time: 05/26/20  9:22 AM  Result Value Ref Range   Hemoglobin A1C 6.4 (A) 4.0 - 5.6 %   HbA1c POC (<> result, manual entry)     HbA1c, POC (prediabetic range)     HbA1c, POC (controlled diabetic range)    UA/M w/rflx Culture, Routine     Status: Abnormal (Preliminary result)   Collection Time: 05/26/20   9:30 AM   Specimen: Urine   Urine  Result Value Ref Range   Specific Gravity, UA 1.025 1.005 - 1.030   pH, UA 5.5 5.0 - 7.5   Color, UA Yellow Yellow   Appearance Ur Cloudy (A) Clear   Leukocytes,UA Negative Negative   Protein,UA Trace Negative/Trace   Glucose, UA Negative Negative   Ketones, UA Negative Negative   RBC, UA Negative Negative   Bilirubin, UA Negative Negative   Urobilinogen, Ur 0.2 0.2 - 1.0 mg/dL   Nitrite, UA Negative Negative   Microscopic Examination Comment     Comment: Microscopic follows if indicated.   Microscopic Examination See below:     Comment: Microscopic was indicated and was performed.   Urinalysis Reflex Comment     Comment: This specimen has reflexed to a Urine Culture.  Microscopic Examination     Status: Abnormal   Collection Time: 05/26/20  9:30 AM   Urine  Result Value Ref Range   WBC, UA 6-10 (A) 0 - 5 /hpf   RBC 3-10 (A) 0 - 2 /hpf   Epithelial Cells (non renal) >10 (A) 0 - 10 /hpf   Casts Present (A) None seen /lpf   Cast Type Epithelial cell casts (A) N/A   Bacteria, UA Moderate (A) None seen/Few  Urine Culture, Reflex     Status: None (Preliminary result)   Collection Time: 05/26/20  9:30 AM   Urine  Result Value Ref Range   Urine Culture, Routine WILL FOLLOW         Assessment/Plan: 1. Encounter for general adult medical examination with abnormal findings Pt due for routine fastig labs, lab slip given to be done before next visit. Due for mammogram and colonoscopy. Pt denied foot exam today due to pain  in r foot for upcoming surgery. She will schedule eye exam. Also declined pap today due to current pain and with so many upcoming procedures.  2. Uncontrolled type 2 diabetes mellitus with hyperglycemia (HCC) - POCT HgB A1C is 6.4 today and had had taken metformin in last month. Will refill metformin today and continue diet/exercise. - metFORMIN (GLUCOPHAGE) 500 MG tablet; Take 1 tablet (500 mg total) by mouth 2 (two) times  daily with a meal.  Dispense: 60 tablet; Refill: 3  3. Essential hypertension Elevated in office, but pt had not taken metoprolol due to running out. Refilled today and will continue to monitor. Continue on  HCTZ. - metoprolol tartrate (LOPRESSOR) 50 MG tablet; Take 1 tablet (50 mg total) by mouth 2 (two) times daily.  Dispense: 60 tablet; Refill: 3  4. Right foot pain In walking boot with plan for surgery, followed by ortho.  5. Left knee pain, unspecified chronicity Planned surgery in April, followed by ortho.  6. Screening for colorectal cancer - Ambulatory referral to Gastroenterology  7. Encounter for screening mammogram for breast cancer Pt is due for mammogram, mass palpated in L breast on exam today with known hx of benign tumor in each breast previously removed in teens. Will order screening mammogram, but will likely need diagnostic mammogram or [place] for further investigation. - MM DIGITAL SCREENING BILATERAL; Future  8. B12 deficiency - cyanocobalamin ((VITAMIN B-12)) injection 1,000 mcg. Will recheck labs before next visit.  9. Morbid obesity ([place]) Previously used phentermine to help with wt loss but there was concern over periodic heart palpitations. Has not been on this recently and dicussed there are better alternatives for wt loss that can be discussed after upcoming procedures are over and she has time to follow up regularly and work on wt loss goals.. Obesity Counseling: Had a lengthy discussion regarding patients BMI and weight issues. Patient was instructed on portion control as well as increased activity. Also discussed caloric restrictions with trying to maintain intake less than 2000 Kcal. Discussions were made in accordance with the 5As of weight management. Simple actions such as not eating late and if able to, taking a walk is suggested.  10. Dysuria - UA/M w/rflx Culture, Routine   General Counseling: Kassondra verbalizes understanding of the findings of todays  visit and agrees with plan of treatment. I have discussed any further diagnostic evaluation that may be needed or ordered today. We also reviewed her medications today. she has been encouraged to call the office with any questions or concerns that should arise related to todays visit.    Counseling:    Orders Placed This Encounter  Procedures  . Microscopic Examination  . Urine Culture, Reflex  . MM DIGITAL SCREENING BILATERAL  . UA/M w/rflx Culture, Routine  . Ambulatory referral to Gastroenterology  . POCT HgB A1C    Meds ordered this encounter  Medications  . cyanocobalamin ((VITAMIN B-12)) injection 1,000 mcg  . metoprolol tartrate (LOPRESSOR) 50 MG tablet    Sig: Take 1 tablet (50 mg total) by mouth 2 (two) times daily.    Dispense:  60 tablet    Refill:  3  . metFORMIN (GLUCOPHAGE) 500 MG tablet    Sig: Take 1 tablet (500 mg total) by mouth 2 (two) times daily with a meal.    Dispense:  60 tablet    Refill:  3    This patient was seen by Drema Dallas, PA-C in collaboration with Dr. Clayborn Bigness as a part of  collaborative care agreement.  Total time spent:40 Minutes  Time spent includes review of chart, medications, test results, and follow up plan with the patient.     Lavera Guise, MD  Internal Medicine

## 2020-05-29 ENCOUNTER — Other Ambulatory Visit: Payer: Self-pay | Admitting: Surgery

## 2020-05-29 LAB — UA/M W/RFLX CULTURE, ROUTINE
Bilirubin, UA: NEGATIVE
Glucose, UA: NEGATIVE
Ketones, UA: NEGATIVE
Leukocytes,UA: NEGATIVE
Nitrite, UA: NEGATIVE
RBC, UA: NEGATIVE
Specific Gravity, UA: 1.025 (ref 1.005–1.030)
Urobilinogen, Ur: 0.2 mg/dL (ref 0.2–1.0)
pH, UA: 5.5 (ref 5.0–7.5)

## 2020-05-29 LAB — MICROSCOPIC EXAMINATION: Epithelial Cells (non renal): >10 /hpf — AB (ref 0–10)

## 2020-05-29 LAB — URINE CULTURE, REFLEX

## 2020-06-13 ENCOUNTER — Other Ambulatory Visit
Admission: RE | Admit: 2020-06-13 | Discharge: 2020-06-13 | Disposition: A | Discharge status: Home / Self Care | Payer: 59 | Source: Ambulatory Visit | Attending: Surgery | Admitting: Surgery

## 2020-06-13 HISTORY — DX: Type 2 diabetes mellitus without complications: E11.9

## 2020-06-13 NOTE — Patient Instructions (Addendum)
Your procedure is scheduled on:06-18-20 WEDNESDAY Report to the Registration Desk on the 1st floor of the [facility]-Then proceed to the 2nd floor Surgery Desk in the [place] To find out your arrival time, please call [phone] between 1PM - 3PM on:06-17-20 TUESDAY  REMEMBER: Instructions that are not followed completely may result in serious medical risk, up to and including death; or upon the discretion of your surgeon and anesthesiologist your surgery may need to be rescheduled.  Do not eat food after midnight the night before surgery.  No gum chewing, lozengers or hard candies.  You may however, drink WATER up to 2 hours before you are scheduled to arrive for your surgery. Do not drink anything within 2 hours of your scheduled arrival time.  Type 1 and Type 2 diabetics should only drink water.  TAKE THESE MEDICATIONS THE MORNING OF SURGERY WITH A SIP OF WATER: -METOPROLOL (TOPROL)  Stop Metformin 2 days prior to surgery-LAST DOSE ON 06-15-20 SUNDAY  One week prior to surgery: Stop Anti-inflammatories (NSAIDS) such as Advil, Aleve, Ibuprofen, Motrin, Naproxen, Naprosyn and Aspirin based products such as Excedrin, Goodys Powder, BC Powder-OK TO TAKE TYLENOL IF NEEDED  Stop ANY OVER THE COUNTER supplements until after surgery-STOP YOUR ASHWAGANDA NOW-HOWEVER, YOU MAY CONTINUE YOUR VITAMIN D3 UP UNTIL THE [place] PRIOR TO SURGERY  No Alcohol for 24 hours before or after surgery.  No Smoking including e-cigarettes for 24 hours prior to surgery.  No chewable tobacco products for at least 6 hours prior to surgery.  No nicotine patches on the [place] of surgery.  Do not use any "recreational" drugs for at least a week prior to your surgery.  Please be advised that the combination of cocaine and anesthesia may have negative outcomes, up to and including death. If you test positive for cocaine, your surgery will be cancelled.  On the morning of surgery brush your teeth with toothpaste  and water, you may rinse your mouth with mouthwash if you wish. Do not swallow any toothpaste or mouthwash.  Do not wear jewelry, make-up, hairpins, clips or nail polish.  Do not wear lotions, powders, or perfumes.   Do not shave body from the neck down 48 hours prior to surgery just in case you cut yourself which could leave a site for infection.  Also, freshly shaved skin may become irritated if using the CHG soap.  Contact lenses, hearing aids and dentures may not be worn into surgery.  Do not bring valuables to the hospital. [facility] is not responsible for any missing/lost belongings or valuables.   Use CHG Soap as directed on instruction sheet   Notify your doctor if there is any change in your medical condition (cold, fever, infection).  Wear comfortable clothing (specific to your surgery type) to the hospital.  Plan for stool softeners for home use; pain medications have a tendency to cause constipation. You can also help prevent constipation by eating foods high in fiber such as fruits and vegetables and drinking plenty of fluids as your diet allows.  After surgery, you can help prevent lung complications by doing breathing exercises.  Take deep breaths and cough every 1-2 hours. Your doctor may order a device called an Incentive Spirometer to help you take deep breaths. When coughing or sneezing, hold a pillow firmly against your incision with both hands. This is called "splinting." Doing this helps protect your incision. It also decreases belly discomfort.  If you are being admitted to the hospital overnight, leave  your suitcase in the car. After surgery it may be brought to your room.  If you are being discharged the [place] of surgery, you will not be allowed to drive home. You will need a responsible adult (18 years or older) to drive you home and stay with you that night.   If you are taking public transportation, you will need to have a responsible adult (18 years or  older) with you. Please confirm with your physician that it is acceptable to use public transportation.   Please call the [place] Dept. at [phone] if you have any questions about these instructions.  Surgery Visitation Policy:  Patients undergoing a surgery or procedure may have one family member or support person with them as long as that person is not COVID-19 positive or experiencing its symptoms.  That person may remain in the waiting area during the procedure.  Inpatient Visitation:    Visiting hours are 7 a.m. to 8 p.m. Inpatients will be allowed two visitors daily. The visitors may change each [place] during the patient's stay. No visitors under the age of 70. Any visitor under the age of 75 must be accompanied by an adult. The visitor must pass COVID-19 screenings, use hand sanitizer when entering and exiting the patient's room and wear a mask at all times, including in the patient's room. Patients must also wear a mask when staff or their visitor are in the room. Masking is required regardless of vaccination status.

## 2020-06-16 ENCOUNTER — Other Ambulatory Visit: Admission: RE | Admit: 2020-06-16 | Payer: 59 | Source: Ambulatory Visit

## 2020-06-16 ENCOUNTER — Other Ambulatory Visit: Payer: Self-pay

## 2020-06-16 ENCOUNTER — Encounter
Admission: RE | Admit: 2020-06-16 | Discharge: 2020-06-16 | Disposition: A | Discharge status: Home / Self Care | Payer: 59 | Source: Ambulatory Visit | Attending: Surgery | Admitting: Surgery

## 2020-06-16 DIAGNOSIS — Z20822 Contact with and (suspected) exposure to covid-19: Secondary | ICD-10-CM | POA: Diagnosis not present

## 2020-06-16 DIAGNOSIS — Z01812 Encounter for preprocedural laboratory examination: Secondary | ICD-10-CM | POA: Diagnosis not present

## 2020-06-16 DIAGNOSIS — Z01818 Encounter for other preprocedural examination: Secondary | ICD-10-CM | POA: Insufficient documentation

## 2020-06-16 LAB — SARS CORONAVIRUS 2 (TAT 6-24 HRS): SARS Coronavirus 2: NEGATIVE

## 2020-06-16 LAB — BASIC METABOLIC PANEL
Anion gap: 8 (ref 5–15)
BUN: 13 mg/dL (ref 6–20)
CO2: 27 mmol/L (ref 22–32)
Calcium: 9.2 mg/dL (ref 8.9–10.3)
Chloride: 104 mmol/L (ref 98–111)
Creatinine, Ser: 0.84 mg/dL (ref 0.44–1.00)
GFR, Estimated: >60 mL/min (ref >60)
Glucose, Bld: 135 mg/dL — ABNORMAL HIGH (ref 70–99)
Potassium: 3.9 mmol/L (ref 3.5–5.1)
Sodium: 139 mmol/L (ref 135–145)

## 2020-06-16 LAB — CBC
HCT: 42 % (ref 36.0–46.0)
Hemoglobin: 13.3 g/dL (ref 12.0–15.0)
MCH: 24.7 pg — ABNORMAL LOW (ref 26.0–34.0)
MCHC: 31.7 g/dL (ref 30.0–36.0)
MCV: 77.9 fL — ABNORMAL LOW (ref 80.0–100.0)
Platelets: 377 10*3/uL (ref 150–400)
RBC: 5.39 MIL/uL — ABNORMAL HIGH (ref 3.87–5.11)
RDW: 16.6 % — ABNORMAL HIGH (ref 11.5–15.5)
WBC: 9.2 10*3/uL (ref 4.0–10.5)
nRBC: 0 % (ref 0.0–0.2)

## 2020-06-16 NOTE — Patient Instructions (Signed)
Your procedure is scheduled on: 06-18-20 [facility]  Report to the Registration Desk on the 1st floor of the [facility]- Then proceed to the 2nd floor Surgery Desk on the [place] To find out your arrival time please call [phone] between 1PM - 3PM on 06-17-20 TUESDAY  Remember: Instructions that are not followed completely may result in serious medical risk,  up to and including death, or upon the discretion of your surgeon and anesthesiologist your  surgery may need to be rescheduled.   Do not eat food after midnight the night before your procedure. No gum chewing, lozengers or hard candies.  You may however, drink WATER up to 2 hours before you are scheduled to arrive for your surgery- Do not drink anything within   2 hours of your scheduled arrival time.  Type 1 and Type 2 diabetics should only drink water.  TAKE THESE MEDICATIONS THE MORNING OF SURGERY WITH A SIP OF WATER: -METOPROLOL (TOPROL)  Stop Metformin 2 days prior to your surgery- LAST DOSE ON 4-10 SUNDAY  One week prior to surgery: Stop anti-inflammatories (NSAIDS) such as Advil, Aleve, Ibuprofen, , Motrin, naproxen, Naprosyn and Aspirin based products such as Excedrin, Goody;s BC Powder- OK TO TAKE TYLENOL IF NEEDED.   Stop ANY OVER THE COUNTER supplements until after surgery- STOP YOUR ASHWAGANDA NOW- HOWEVER YOU MAY CONTINUE YOUR VITAMIN  D3 UP TO THE [place] PRIOR TO SURGERY   No Alcohol for 24 hours before or after surgery.  Do Not Smoke or use e-cigarettes For 24 Hours Prior to Your Surgery. No chewable tobacco products for at least 6 hours prior to surgery. No nicotine patches on the [place] of surgery.  Do not use any "recreational" drugs for at least one week prior to your surgery.   Please be advised that the combination of cocaine and anesthesia may have negative outcomes, up to and including death.  If you test positive for cocaine, your surgery will be cancelled.  On the morning  of surgery brush your teeth with toothpaste and water, you may rinse your mouth with mouthwash if you wish.   Do not swallow any toothpaste of mouthwash.     Do not wear jewelry, make-up, hairpins, clips or nail polish.  Do not wear lotions, powders, or perfumes.   Do not shave body from the neck down 48 hours prior to surgery just in case you cut yourself which could also leave a site for infection. Also, freshly shave skin may become irritates if using CHG soap.  Contacts, hearing aids and dentures may not be worn into surgery.  Do not bring valuables to the hospital. [facility] is not responsible for any missing/lost belongings or valuables.   Use CHG Soap as directed on instruction sheet.  Notify your doctor if there is any change in your medical condition (cold, fever, infection).   Wear comfortable clothing (specific to your surgery type) to the hospital.  Plan for stool softeners for home use; pain medications have a tendency t cause constipation.  You can also help prevent constipation by eating foods high in fiber such as fruits and vegetables and drinking plenty of fluids as your diet allows.  After surgery, you can help prevent lung complications by doing breathing exercises. Take deep breaths and cough every 1-2 hours. Your doctor may order a device called an Incentive Spirometer to help you take deep breaths. When coughing or sneezing, hold a pillow firmly against your incision. It also decreases belly discomfort.  If you are being admitted to the hospital overnight, leave your suitcase in the car.  After surgery it may be brought to your room.  If you are being discharged the [place] of surgery, you will not be allowed to drive home.   You will need a responsible adult (18 years or older) to drive you home and stay with you that night.  If you are taking public transportation, you will need to have a responsible adult (18 years r older) with you. Please confirm with your  physician that it is acceptable to use public transportation.  Please call the [place] Dept. at [phone]) if you have any questions about these instructions.   Surgery Visitation Policy:  Patients undergoing a surgery or procedure may have one family member or support person with them as long as that person is not COVID-19 positive or experiencing its symptoms.  That person may remain in the waiting area during the procedure.   Inpatient Visitation:   Visiting hours are 7 a.m. to 8 p.m. Inpatients will be allowed two visitors daily. The visitors may change each [place] during the patient's stay. No visitors under the age of 21. Any visitors under the age of 80 must be accompanied by an adult. The visitors must pass COVID-19 screening, use hand sanitizer when entering and exiting the patient's room and wear mask at all times, including the patient's room.  Masking is required regardless of vaccination status.

## 2020-06-18 ENCOUNTER — Ambulatory Visit: Payer: 59 | Admitting: Certified Registered Nurse Anesthetist

## 2020-06-18 ENCOUNTER — Encounter
Admission: RE | Disposition: A | Discharge status: Home / Self Care | Payer: Self-pay | Source: Home / Self Care | Attending: Surgery

## 2020-06-18 ENCOUNTER — Ambulatory Visit: Payer: 59 | Admitting: Urgent Care

## 2020-06-18 ENCOUNTER — Other Ambulatory Visit: Payer: Self-pay

## 2020-06-18 ENCOUNTER — Encounter: Payer: Self-pay | Admitting: Surgery

## 2020-06-18 ENCOUNTER — Ambulatory Visit
Admission: RE | Admit: 2020-06-18 | Discharge: 2020-06-18 | Disposition: A | Discharge status: Home / Self Care | Payer: 59 | Attending: Surgery | Admitting: Surgery

## 2020-06-18 DIAGNOSIS — Z7984 Long term (current) use of oral hypoglycemic drugs: Secondary | ICD-10-CM | POA: Insufficient documentation

## 2020-06-18 DIAGNOSIS — Z791 Long term (current) use of non-steroidal anti-inflammatories (NSAID): Secondary | ICD-10-CM | POA: Insufficient documentation

## 2020-06-18 DIAGNOSIS — M94262 Chondromalacia, left knee: Secondary | ICD-10-CM | POA: Diagnosis not present

## 2020-06-18 DIAGNOSIS — Z91018 Allergy to other foods: Secondary | ICD-10-CM | POA: Diagnosis not present

## 2020-06-18 DIAGNOSIS — M12262 Villonodular synovitis (pigmented), left knee: Secondary | ICD-10-CM | POA: Diagnosis not present

## 2020-06-18 DIAGNOSIS — I10 Essential (primary) hypertension: Secondary | ICD-10-CM | POA: Diagnosis not present

## 2020-06-18 DIAGNOSIS — Z91030 Bee allergy status: Secondary | ICD-10-CM | POA: Insufficient documentation

## 2020-06-18 DIAGNOSIS — E119 Type 2 diabetes mellitus without complications: Secondary | ICD-10-CM | POA: Insufficient documentation

## 2020-06-18 DIAGNOSIS — Z79899 Other long term (current) drug therapy: Secondary | ICD-10-CM | POA: Insufficient documentation

## 2020-06-18 DIAGNOSIS — Y939 Activity, unspecified: Secondary | ICD-10-CM | POA: Insufficient documentation

## 2020-06-18 DIAGNOSIS — S83232A Complex tear of medial meniscus, current injury, left knee, initial encounter: Secondary | ICD-10-CM | POA: Insufficient documentation

## 2020-06-18 DIAGNOSIS — X58XXXA Exposure to other specified factors, initial encounter: Secondary | ICD-10-CM | POA: Diagnosis not present

## 2020-06-18 HISTORY — PX: KNEE ARTHROSCOPY: SHX127

## 2020-06-18 LAB — GLUCOSE, CAPILLARY
Glucose-Capillary: 135 mg/dL — ABNORMAL HIGH (ref 70–99)
Glucose-Capillary: 139 mg/dL — ABNORMAL HIGH (ref 70–99)

## 2020-06-18 SURGERY — ARTHROSCOPY, KNEE
Anesthesia: General | Site: Knee | Laterality: Left

## 2020-06-18 MED ORDER — OXYCODONE-ACETAMINOPHEN 5-325 MG PO TABS: 1.0000 | ORAL_TABLET | ORAL | Status: DC | PRN

## 2020-06-18 MED ORDER — CHLORHEXIDINE GLUCONATE 0.12 % MT SOLN
OROMUCOSAL | Status: AC
  Administered 2020-06-18: 15 mL via OROMUCOSAL
  Filled 2020-06-18: qty 15

## 2020-06-18 MED ORDER — FAMOTIDINE 20 MG PO TABS
ORAL_TABLET | ORAL | Status: AC
  Administered 2020-06-18: 20 mg via ORAL
  Filled 2020-06-18: qty 1

## 2020-06-18 MED ORDER — MIDAZOLAM HCL 2 MG/2ML IJ SOLN
INTRAMUSCULAR | Status: AC
  Filled 2020-06-18: qty 2

## 2020-06-18 MED ORDER — METOPROLOL TARTRATE 50 MG PO TABS
50.0000 mg | ORAL_TABLET | Freq: Once | ORAL | Status: AC
  Administered 2020-06-18: 50 mg via ORAL

## 2020-06-18 MED ORDER — DEXTROSE 5 % IV SOLN
3.0000 g | INTRAVENOUS | Status: AC
  Administered 2020-06-18: 3 g via INTRAVENOUS
  Filled 2020-06-18: qty 3

## 2020-06-18 MED ORDER — ORAL CARE MOUTH RINSE: 15.0000 mL | Freq: Once | OROMUCOSAL | Status: AC

## 2020-06-18 MED ORDER — IBUPROFEN 800 MG PO TABS: 800.0000 mg | ORAL_TABLET | Freq: Three times a day (TID) | ORAL | 2 refills | Status: DC | PRN

## 2020-06-18 MED ORDER — CHLORHEXIDINE GLUCONATE 0.12 % MT SOLN: 15.0000 mL | Freq: Once | OROMUCOSAL | Status: AC

## 2020-06-18 MED ORDER — SUGAMMADEX SODIUM 500 MG/5ML IV SOLN
INTRAVENOUS | Status: DC | PRN
  Administered 2020-06-18: 259.2 mg via INTRAVENOUS

## 2020-06-18 MED ORDER — ROCURONIUM BROMIDE 100 MG/10ML IV SOLN
INTRAVENOUS | Status: DC | PRN
  Administered 2020-06-18: 5 mg via INTRAVENOUS
  Administered 2020-06-18: 45 mg via INTRAVENOUS
  Administered 2020-06-18: 10 mg via INTRAVENOUS

## 2020-06-18 MED ORDER — FENTANYL CITRATE (PF) 100 MCG/2ML IJ SOLN
INTRAMUSCULAR | Status: DC | PRN
  Administered 2020-06-18 (×2): 50 ug via INTRAVENOUS

## 2020-06-18 MED ORDER — ACETAMINOPHEN 10 MG/ML IV SOLN
INTRAVENOUS | Status: AC
  Filled 2020-06-18: qty 100

## 2020-06-18 MED ORDER — SUCCINYLCHOLINE CHLORIDE 200 MG/10ML IV SOSY
PREFILLED_SYRINGE | INTRAVENOUS | Status: AC
  Filled 2020-06-18: qty 10

## 2020-06-18 MED ORDER — SUCCINYLCHOLINE CHLORIDE 20 MG/ML IJ SOLN
INTRAMUSCULAR | Status: DC | PRN
  Administered 2020-06-18: 120 mg via INTRAVENOUS

## 2020-06-18 MED ORDER — LIDOCAINE HCL (PF) 2 % IJ SOLN
INTRAMUSCULAR | Status: AC
  Filled 2020-06-18: qty 5

## 2020-06-18 MED ORDER — HYDROCODONE-ACETAMINOPHEN 5-325 MG PO TABS: 1.0000 | ORAL_TABLET | Freq: Four times a day (QID) | ORAL | 0 refills | Status: DC | PRN

## 2020-06-18 MED ORDER — FENTANYL CITRATE (PF) 100 MCG/2ML IJ SOLN
25.0000 ug | INTRAMUSCULAR | Status: DC | PRN
Start: 2020-06-18 — End: 2020-06-18

## 2020-06-18 MED ORDER — LIDOCAINE HCL (CARDIAC) PF 100 MG/5ML IV SOSY
PREFILLED_SYRINGE | INTRAVENOUS | Status: DC | PRN
  Administered 2020-06-18: 100 mg via INTRAVENOUS

## 2020-06-18 MED ORDER — OXYCODONE-ACETAMINOPHEN 5-325 MG PO TABS
ORAL_TABLET | ORAL | Status: AC
  Filled 2020-06-18: qty 1

## 2020-06-18 MED ORDER — DEXAMETHASONE SODIUM PHOSPHATE 10 MG/ML IJ SOLN
INTRAMUSCULAR | Status: AC
  Filled 2020-06-18: qty 1

## 2020-06-18 MED ORDER — BUPIVACAINE-EPINEPHRINE (PF) 0.5% -1:200000 IJ SOLN
INTRAMUSCULAR | Status: DC | PRN
  Administered 2020-06-18: 30 mL via PERINEURAL
  Administered 2020-06-18: 20 mL via PERINEURAL

## 2020-06-18 MED ORDER — EPHEDRINE SULFATE 50 MG/ML IJ SOLN
INTRAMUSCULAR | Status: DC | PRN
  Administered 2020-06-18 (×2): 5 mg via INTRAVENOUS

## 2020-06-18 MED ORDER — LIDOCAINE HCL 1 % IJ SOLN
INTRAMUSCULAR | Status: DC | PRN
  Administered 2020-06-18: 30 mL

## 2020-06-18 MED ORDER — SODIUM CHLORIDE 0.9 % IV SOLN: INTRAVENOUS | Status: DC

## 2020-06-18 MED ORDER — PHENYLEPHRINE HCL (PRESSORS) 10 MG/ML IV SOLN
INTRAVENOUS | Status: DC | PRN
  Administered 2020-06-18 (×5): 100 ug via INTRAVENOUS

## 2020-06-18 MED ORDER — LIDOCAINE HCL (PF) 1 % IJ SOLN
INTRAMUSCULAR | Status: AC
  Filled 2020-06-18: qty 30

## 2020-06-18 MED ORDER — FAMOTIDINE 20 MG PO TABS: 20.0000 mg | ORAL_TABLET | Freq: Once | ORAL | Status: AC

## 2020-06-18 MED ORDER — FENTANYL CITRATE (PF) 100 MCG/2ML IJ SOLN
INTRAMUSCULAR | Status: AC
  Filled 2020-06-18: qty 2

## 2020-06-18 MED ORDER — DEXAMETHASONE SODIUM PHOSPHATE 10 MG/ML IJ SOLN
INTRAMUSCULAR | Status: DC | PRN
  Administered 2020-06-18: 6 mg via INTRAVENOUS

## 2020-06-18 MED ORDER — SUGAMMADEX SODIUM 500 MG/5ML IV SOLN
INTRAVENOUS | Status: AC
  Filled 2020-06-18: qty 5

## 2020-06-18 MED ORDER — ONDANSETRON HCL 4 MG/2ML IJ SOLN
INTRAMUSCULAR | Status: DC | PRN
  Administered 2020-06-18: 4 mg via INTRAVENOUS

## 2020-06-18 MED ORDER — MIDAZOLAM HCL 2 MG/2ML IJ SOLN
INTRAMUSCULAR | Status: DC | PRN
  Administered 2020-06-18: 2 mg via INTRAVENOUS

## 2020-06-18 MED ORDER — ONDANSETRON HCL 4 MG/2ML IJ SOLN: 4.0000 mg | Freq: Once | INTRAMUSCULAR | Status: DC | PRN

## 2020-06-18 MED ORDER — ROCURONIUM BROMIDE 10 MG/ML (PF) SYRINGE
PREFILLED_SYRINGE | INTRAVENOUS | Status: AC
  Filled 2020-06-18: qty 10

## 2020-06-18 MED ORDER — BUPIVACAINE-EPINEPHRINE (PF) 0.5% -1:200000 IJ SOLN
INTRAMUSCULAR | Status: AC
  Filled 2020-06-18: qty 60

## 2020-06-18 MED ORDER — ACETAMINOPHEN 10 MG/ML IV SOLN
INTRAVENOUS | Status: DC | PRN
  Administered 2020-06-18: 1000 mg via INTRAVENOUS

## 2020-06-18 MED ORDER — PROPOFOL 10 MG/ML IV BOLUS
INTRAVENOUS | Status: AC
  Filled 2020-06-18: qty 40

## 2020-06-18 MED ORDER — METOPROLOL TARTRATE 50 MG PO TABS
ORAL_TABLET | ORAL | Status: AC
  Filled 2020-06-18: qty 1

## 2020-06-18 MED ORDER — ONDANSETRON HCL 4 MG/2ML IJ SOLN
INTRAMUSCULAR | Status: AC
  Filled 2020-06-18: qty 2

## 2020-06-18 MED ORDER — HYDROCHLOROTHIAZIDE 25 MG PO TABS: 25.0000 mg | ORAL_TABLET | Freq: Every day | ORAL | Status: DC | PRN

## 2020-06-18 SURGICAL SUPPLY — 43 items
APL PRP STRL LF DISP 70% ISPRP (MISCELLANEOUS) ×2
BAG COUNTER SPONGE EZ (MISCELLANEOUS) IMPLANT
BAG SPNG 4X4 CLR HAZ (MISCELLANEOUS)
BLADE FULL RADIUS 3.5 (BLADE) ×1 IMPLANT
BLADE SHAVER 4.5X7 STR FR (MISCELLANEOUS) ×1 IMPLANT
BLADE SHAVER TORPEDO 4X13 (MISCELLANEOUS) ×2 IMPLANT
BNDG ELASTIC 6X5.8 VLCR STR LF (GAUZE/BANDAGES/DRESSINGS) ×2 IMPLANT
CATH ROBINSON RED A/P 12FR (CATHETERS) ×1 IMPLANT
CHLORAPREP W/TINT 26 (MISCELLANEOUS) ×3 IMPLANT
COLLECTOR GRAFT TISSUE (SYSTAGENIX WOUND MANAGEMENT) ×4
COVER WAND RF STERILE (DRAPES) ×2 IMPLANT
CUFF TOURN SGL QUICK 24 (TOURNIQUET CUFF)
CUFF TOURN SGL QUICK 30 (TOURNIQUET CUFF) ×2
CUFF TRNQT CYL 24X4X16.5-23 (TOURNIQUET CUFF) IMPLANT
CUFF TRNQT CYL 30X4X21-28X (TOURNIQUET CUFF) ×1 IMPLANT
CUP MEDICINE 2OZ PLAST GRAD ST (MISCELLANEOUS) ×1 IMPLANT
DRAPE IMP U-DRAPE 54X76 (DRAPES) ×2 IMPLANT
ELECT REM PT RETURN 9FT ADLT (ELECTROSURGICAL) ×2
ELECTRODE REM PT RTRN 9FT ADLT (ELECTROSURGICAL) ×1 IMPLANT
GAUZE SPONGE 4X4 12PLY STRL (GAUZE/BANDAGES/DRESSINGS) ×2 IMPLANT
GLOVE SURG ENC MOIS LTX SZ8 (GLOVE) ×4 IMPLANT
GLOVE SURG ENC TEXT LTX SZ7 (GLOVE) ×4 IMPLANT
GLOVE SURG UNDER LTX SZ8 (GLOVE) ×2 IMPLANT
GLOVE SURG UNDER POLY LF SZ7.5 (GLOVE) ×2 IMPLANT
GOWN STRL REUS W/ TWL LRG LVL3 (GOWN DISPOSABLE) ×1 IMPLANT
GOWN STRL REUS W/ TWL XL LVL3 (GOWN DISPOSABLE) ×2 IMPLANT
GOWN STRL REUS W/TWL LRG LVL3 (GOWN DISPOSABLE) ×2
GOWN STRL REUS W/TWL XL LVL3 (GOWN DISPOSABLE) ×4
IV LACTATED RINGER IRRG 3000ML (IV SOLUTION) ×2
IV LR IRRIG 3000ML ARTHROMATIC (IV SOLUTION) ×1 IMPLANT
KIT TURNOVER KIT A (KITS) ×2 IMPLANT
MANIFOLD NEPTUNE II (INSTRUMENTS) ×4 IMPLANT
NDL HYPO 21X1.5 SAFETY (NEEDLE) ×1 IMPLANT
NEEDLE HYPO 21X1.5 SAFETY (NEEDLE) ×2 IMPLANT
PACK ARTHROSCOPY KNEE (MISCELLANEOUS) ×2 IMPLANT
PENCIL ELECTRO HAND CTR (MISCELLANEOUS) IMPLANT
PORT APPOLLO RF 90DEGREE MULTI (SURGICAL WAND) ×1 IMPLANT
SUT PROLENE 4 0 PS 2 18 (SUTURE) ×1 IMPLANT
SUT TICRON COATED BLUE 2 0 30 (SUTURE) IMPLANT
SYR 50ML LL SCALE MARK (SYRINGE) ×2 IMPLANT
TISSUE GRAFT COLLECTOR (SYSTAGENIX WOUND MANAGEMENT) IMPLANT
TUBING ARTHRO INFLOW-ONLY STRL (TUBING) ×2 IMPLANT
WAND WEREWOLF FLOW 90D (MISCELLANEOUS) IMPLANT

## 2020-06-18 NOTE — Discharge Instructions (Signed)
AMBULATORY SURGERY  DISCHARGE INSTRUCTIONS   1) The drugs that you were given will stay in your system until tomorrow so for the next 24 hours you should not:  A) Drive an automobile B) Make any legal decisions C) Drink any alcoholic beverage   2) You may resume regular meals tomorrow.  Today it is better to start with liquids and gradually work up to solid foods.  You may eat anything you prefer, but it is better to start with liquids, then soup and crackers, and gradually work up to solid foods.   3) Please notify your doctor immediately if you have any unusual bleeding, trouble breathing, redness and pain at the surgery site, drainage, fever, or pain not relieved by medication.    4) Additional Instructions:   Please contact your physician with any problems or [facility] at [phone], Monday through Friday 6 am to 4 pm, or [place] at [facility] number at [phone].   Orthopedic discharge instructions: Keep dressing dry and intact.  May shower after dressing changed on post-op Schwan #4 (Sunday).  Cover staples/sutures with Band-Aids after drying off. Apply ice frequently to knee. Take ibuprofen 800 mg TID with meals for 7-10 days, then as necessary. Take pain medication as prescribed or ES Tylenol when needed.  May weight-bear as tolerated - use crutches or walker as needed. Follow-up in 10-14 days or as scheduled.

## 2020-06-18 NOTE — Anesthesia Preprocedure Evaluation (Signed)
Anesthesia Evaluation  Patient identified by MRN, date of birth, ID band Patient awake    Reviewed: Allergy & Precautions, H&P , NPO status , Patient's Chart, lab work & pertinent test results, reviewed documented beta blocker date and time   Airway Mallampati: III  TM Distance: >3 FB Neck ROM: full    Dental  (+) Teeth Intact   Pulmonary shortness of breath, asthma ,    Pulmonary exam normal        Cardiovascular Exercise Tolerance: Good hypertension, On Medications + Past MI  Normal cardiovascular exam+ dysrhythmias  Rhythm:regular Rate:Normal     Neuro/Psych  Headaches, negative psych ROS   GI/Hepatic negative GI ROS, Neg liver ROS,   Endo/Other  negative endocrine ROSdiabetes, Well Controlled, Type 2, Oral Hypoglycemic Agents  Renal/GU negative Renal ROS  negative genitourinary   Musculoskeletal   Abdominal   Peds  Hematology negative hematology ROS (+)   Anesthesia Other Findings Past Medical History: No date: Adopted No date: Arthritis     Comment:  BOTH KNEES No date: Asthma No date: Benign tumor No date: Diabetes mellitus without complication (HCC) No date: Dyspnea     Comment:  VERY RARE No date: Dysrhythmia     Comment:  IRREGULAR PER PT No date: Headache No date: Hypertension No date: Myocardial infarction ([place])     Comment:  AGE 31 Past Surgical History: No date: BREAST SURGERY; Bilateral     Comment:  BENIGN TUMORS 04/01/2016: CHONDROPLASTY; Right     Comment:  Procedure: CHONDROPLASTY -ABRASION OF FEMORAL TROCHLEA,               [place];  Surgeon: Corky Mull, MD;                Location: [facility] OR[facility];  Service: Orthopedics;  Laterality:               Right; 04/01/2016: KNEE ARTHROSCOPY WITH MENISCAL REPAIR; Right     Comment:  Procedure: KNEE ARTHROSCOPY WITH PARTIAL MEDIAL                MENISCAL REPAIR;  Surgeon: Corky Mull, MD;  Location:               [facility] OR[facility];  Service:  Orthopedics;  Laterality: Right; No date: TUBAL LIGATION 2016: TUMOR EXCISION     Comment:  BENIGN-HEAD   Reproductive/Obstetrics negative OB ROS                             Anesthesia Physical Anesthesia Plan  ASA: III  Anesthesia Plan: General ETT   Post-op Pain Management:    Induction:   PONV Risk Score and Plan: 4 or greater  Airway Management Planned:   Additional Equipment:   Intra-op Plan:   Post-operative Plan:   Informed Consent: I have reviewed the patients History and Physical, chart, labs and discussed the procedure including the risks, benefits and alternatives for the proposed anesthesia with the patient or authorized representative who has indicated his/her understanding and acceptance.     Dental Advisory Given  Plan Discussed with: CRNA  Anesthesia Plan Comments:         Anesthesia Quick Evaluation

## 2020-06-18 NOTE — Transfer of Care (Signed)
Immediate Anesthesia Transfer of Care Note  Patient: Destiny Singh  Procedure(s) Performed: LEFT KNEE ARTHROSCOPY WITH DEBRIDEMENT AND PARTIAL MEDIAL MENISCECTOMY (Left Knee)  Patient Location: PACU  Anesthesia Type:General  Level of Consciousness: awake and alert   Airway & Oxygen Therapy: Patient Spontanous Breathing and Patient connected to face mask oxygen  Post-op Assessment: Report given to RN and Post -op Vital signs reviewed and stable  Post vital signs: Reviewed and stable  Last Vitals:  Vitals Value Taken Time  BP 150/89 06/18/20 0917  Temp    Pulse 80 06/18/20 0920  Resp 11 06/18/20 0920  SpO2 98 % 06/18/20 0920  Vitals shown include unvalidated device data.  Last Pain:  Vitals:   06/18/20 0614  TempSrc: Temporal  PainSc: 0-No pain         Complications: No complications documented.

## 2020-06-18 NOTE — H&P (Addendum)
History of Present Illness: Destiny Singh is a 50 y.o. who presents today today for history and physical. She is to undergo a left knee arthroscopy on 06/18/2020. Patient was last seen in our clinic on 04/11/2020. There have been no change in her condition since that time.  The symptoms began about 6-8 months ago and developed without any specific cause or injury. However, her occupation requires her to be on her feet for prolonged periods of time. She is status post an arthroscopic partial medial meniscectomy with abrasion chondroplasty of multiple areas of her right knee approximately 4 years ago from which she has done quite well. She saw Dr. Candelaria Stagers for the symptoms several months ago. He gave her steroid injection which she states provided moderate relief of her symptoms for about a month or so before her symptoms recurred, prompting her to make this appointment. 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 The pain is located along the anterior and medial aspect of the knee. The pain is described as aching, burning, stabbing and throbbing. The symptoms are aggravated with normal daily activities, at rest, using stairs, at higher levels of activity, rising from a chair, walking, standing and standing pivot. She also describes no mechanical symptoms. She has mild associated swelling and deformity. She has tried various ointments, acetaminophen, anti-inflammatories, narcotic medications and steroid injections with limited benefit.  Past Medical History: . Chicken pox  . Complex tear of medial meniscus of left knee as current injury 04/11/2020  . Complex tear of medial meniscus of right knee as current injury 03/03/2016  . Dorsalgia  . Effusion of right knee 05/24/2016  . Heart palpitations  . Hyperlipidemia  . Hypertension  . Migraines  . Obesity (BMI 30-39.9), unspecified  . Palpitations 09/26/2018  . 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 Primary osteoarthritis of left knee 04/11/2020  . Rupture of right posterior tibialis tendon 12/10/2019  . Type 2 diabetes mellitus with hyperglycemia, without long-term current use of insulin (CMS-HCC) 04/24/2019  . Vitamin D deficiency   Past Surgical History: . 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 Roland Rack)  . BREAST SURGERY  . TUBAL LIGATION   Past Family History: Adopted: Yes  . No Known Problems Mother  . No Known Problems Father   Medications: . acetaminophen-codeine (TYLENOL #3) 300-30 mg tablet Take by mouth Take 1 tablet by mouth every 6 (six) hours as needed for moderate pain.  Marland Kitchen amLODIPine (NORVASC) 5 MG tablet Take 2 tablets by mouth once daily  . ASHWAGANDHA ROOT EXTRACT ORAL Take 1 capsule by mouth 3 (three) times a [place]  . cholecalciferol (VITAMIN D3) 1000 unit tablet Take 1,000 Units by mouth once daily  . cyanocobalamin (VITAMIN B12) 1,000 mcg/mL injection Inject 1,000 mcg into the muscle monthly  . hydroCHLOROthiazide (HYDRODIURIL) 25 MG tablet Take by mouth Take 1 tablet (25 mg total) by mouth daily.  Marland Kitchen ibuprofen (ADVIL,MOTRIN) 800 MG tablet Take 800 mg by mouth every 8 (eight) hours as needed.  . metFORMIN (GLUCOPHAGE) 500 MG tablet Take 2 tablets by mouth once daily  . metoprolol succinate (TOPROL-XL) 100 MG XL tablet Take 1 tablet by mouth once daily  . metoprolol tartrate (LOPRESSOR) 50 MG tablet 50 mg once daily   Allergies: . Other Anaphylaxis (Bell peppers) . Venom-Honey Bee Anaphylaxis  Review of Systems: A comprehensive 14 point ROS was performed, reviewed, and the pertinent orthopaedic findings are documented in the HPI.  Physical Exam: BP 132/80 (BP Location: Left upper arm, Patient Position: Sitting, BP Cuff Size: Large Adult)  Ht 174 cm (5' 8.5")  Wt (!)  129.6 kg (285 lb 12.8 oz)  BMI 42.82 kg/m   General: Well-developed well-nourished female seen in no acute distress.   HEENT: Atraumatic,normocephalic. Pupils are equal and reactive to light. Oropharynx is clear with moist mucosa  Lungs: Clear to auscultation bilaterally   Cardiovascular: Regular rate and rhythm. Normal S1, S2. No murmurs. No appreciable gallops or rubs. Peripheral pulses are palpable.  Abdomen: Soft, non-tender, nondistended. Bowel sounds present  Left knee exam: GAIT: antalgic gait and uses no assistive devices. ALIGNMENT: mild varus SKIN: unremarkable SWELLING: minimal EFFUSION: small WARMTH: no warmth TENDERNESS: moderate tenderness over the medial joint line, minimal tenderness along lateral joint line ROM: 0 to 90 degrees with pain in maximal flexion McMURRAY'S: positive PATELLOFEMORAL: normal tracking with no peri-patellar tenderness and negative apprehension sign CREPITUS: no LACHMAN'S: negative PIVOT SHIFT: negative ANTERIOR DRAWER: negative POSTERIOR DRAWER: negative VARUS/VALGUS: positive pseudolaxity to varus stressing  She is neurovascularly intact to the left lower extremity and foot.  Neurological: The patient is alert and oriented Sensation to light touch appears to be intact and within normal limits Gross motor strength appeared to be equal to 5/5  Vascular : Peripheral pulses felt to be palpable. Capillary refill appears to be intact and within normal limits  X-ray: X-rays of the left knee taken in [facility] shows moderate to severe degenerative changes, primarily involving the medial compartment with 80% medial joint space narrowing. Lesser degenerative changes were noted to the lateral more so to the the patellofemoral compartment as well. Overall alignment is mild varus.  Impression: 1. Complex tear medial meniscus left knee 2. Primary osteoarthritis left knee  Plan: The treatment options, including both surgical  and nonsurgical choices, have been discussed in detail with the patient. The patient would like to proceed with surgical intervention to include a left knee arthroscopy with debridement and partial meniscectomy. The risks (including bleeding, infection, nerve and/or blood vessel injury, persistent or recurrent pain, loosening or failure of the components, leg length inequality, dislocation, need for further surgery, blood clots, strokes, heart attacks or arrhythmias, pneumonia, etc.) and benefits of the surgical procedure were discussed. The patient states his/her understanding and agrees to proceed. A formal written consent will be obtained by the nursing staff.   H&P reviewed and patient re-examined. No changes.

## 2020-06-18 NOTE — Op Note (Signed)
06/18/2020  11:00 AM  Patient:   Shakari R Tickner  Pre-Op Diagnosis:   Complex tear of medial meniscus with underlying degenerative joint disease, left knee.  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  Procedure:   Arthroscopic debridement of focal synovial lesion consistent with pigmented villonodular synovitis, arthroscopic partial medial meniscectomy, arthroscopic abrasion chondroplasty of grade 3 chondromalacial changes involving the femoral trochlea and medial femoral condyle, and injection of harvested infrapatellar fat pad cells, left knee.  Surgeon:   Pascal Lux, MD  Assistant:   Marijean Bravo, PA-S  Anesthesia:   General LMA  Findings:   As above.  There were diffuse grade 3 chondromalacial changes involving the femoral trochlea, the central and lateral portions of the patella, the medial weightbearing portion of the medial femoral condyle, and the lateral tibial plateau.  Grade 2 chondromalacial changes of the medial tibial plateau also were identified.  The anterior and posterior cruciate ligament were in satisfactory condition, as was the lateral meniscus.  Complications:   None  EBL:   5 cc.  Total fluids:   600 cc of crystalloid.  Tourniquet time:   None  Drains:   None  Closure:   4-0 Prolene interrupted sutures.  Brief clinical note:   The patient is a 50 year old female with a 6 to 61-month history of medial sided left knee pain. Her symptoms have persisted despite medications, activity modification, etc. Her history and examination are consistent with a possible medial meniscus tear which was confirmed by MRI scan. The study also demonstrated moderate degenerative changes. The patient presents at this time for arthroscopy, debridement, and partial medial meniscectomy.  Procedure:   The patient was brought into the operating room and lain in the supine  position. After adequate general laryngeal mask anesthesia was obtained, a timeout was performed to verify the appropriate side. The patient's left knee was injected sterilely using a solution of 30 cc of 1% lidocaine and 30 cc of 0.5% Sensorcaine with epinephrine. The left lower extremity was prepped with ChloraPrep solution before being draped sterilely. Preoperative antibiotics were administered. The expected portal sites were injected with 0.5% Sensorcaine with epinephrine before the camera was placed in the anterolateral portal and instrumentation performed through the anteromedial portal.   The knee was sequentially examined beginning in the suprapatellar pouch, then progressing to the patellofemoral space, the medial gutter and compartment, the notch, and finally the lateral compartment and gutter. The findings were as described above. Abundant reactive synovial tissues anteriorly were debrided using the full-radius resector in order to improve visualization. Utilizing the Arthrex graft net device, approximately 6 cc of fat was collected from debridement of the infrapatellar fat pad. During this debridement, a focal synovial lesion suspicious for PVNS was identified and harvested separately before being sent to pathology for definitive identification. The medial meniscus tear was identified and debrided back to stable margins using a combination of the mini-munchers and full-radius resector. The remaining portion of the meniscus was probed and found to be stable. The areas of grade 3 chondromalacial changes involving the medial femoral condyle and femoral trochlea were debrided back to stable margins using the full-radius resector. The instruments were removed from the joint after suctioning the excess fluid. The approximately 6 cc of fatty tissue that had been harvested with the graft net was reintroduced into the joint through a #12 red rubber catheter to act as a source of mesenchymal stromal  cells.  The portal sites were closed using 4-0 Prolene interrupted sutures before a sterile bulky dressing was applied to the knee. The patient was then awakened, extubated, and returned to the recovery room in satisfactory condition after tolerating the procedure well.

## 2020-06-18 NOTE — Anesthesia Procedure Notes (Signed)
Procedure Name: Intubation Performed by: Demetrius Charity, CRNA Pre-anesthesia Checklist: Patient identified, Patient being monitored, Timeout performed, Emergency Drugs available and Suction available Patient Re-evaluated:Patient Re-evaluated prior to induction Oxygen Delivery Method: Circle system utilized Preoxygenation: Pre-oxygenation with 100% oxygen Induction Type: IV induction Ventilation: Oral airway inserted - appropriate to patient size and Two handed mask ventilation required Laryngoscope Size: 3 and McGraph Grade View: Grade II Tube type: Oral Tube size: 7.0 mm Number of attempts: 1 Airway Equipment and Method: Stylet and Video-laryngoscopy Placement Confirmation: ETT inserted through vocal cords under direct vision,  positive ETCO2 and breath sounds checked- equal and bilateral Secured at: 21 cm Tube secured with: Tape Dental Injury: Teeth and Oropharynx as per pre-operative assessment  Difficulty Due To: Difficulty was anticipated and Difficult Airway- due to large tongue

## 2020-06-19 ENCOUNTER — Encounter: Payer: Self-pay | Admitting: Surgery

## 2020-06-19 LAB — SURGICAL PATHOLOGY

## 2020-06-25 ENCOUNTER — Encounter: Payer: Self-pay | Admitting: *Deleted

## 2020-06-25 NOTE — Anesthesia Postprocedure Evaluation (Signed)
Anesthesia Post Note  Patient: Destiny Singh  Procedure(s) Performed: LEFT KNEE ARTHROSCOPY WITH DEBRIDEMENT AND PARTIAL MEDIAL MENISCECTOMY (Left Knee)  Patient location during evaluation: PACU Anesthesia Type: General Level of consciousness: awake and alert Pain management: pain level controlled Vital Signs Assessment: post-procedure vital signs reviewed and stable Respiratory status: spontaneous breathing, nonlabored ventilation, respiratory function stable and patient connected to nasal cannula oxygen Cardiovascular status: blood pressure returned to baseline and stable Postop Assessment: no apparent nausea or vomiting Anesthetic complications: no   No complications documented.   Last Vitals:  Vitals:   06/18/20 0943 06/18/20 0944  BP: (!) 143/96 (!) 150/83  Pulse: 76 75  Resp: 15 14  Temp: 36.9 C 36.4 C  SpO2: 94% 96%    Last Pain:  Vitals:   06/18/20 0943  TempSrc: Oral  PainSc:                  Molli Barrows

## 2020-06-30 ENCOUNTER — Other Ambulatory Visit: Payer: Self-pay | Admitting: Surgery

## 2020-06-30 ENCOUNTER — Other Ambulatory Visit: Payer: Self-pay

## 2020-06-30 ENCOUNTER — Ambulatory Visit
Admission: RE | Admit: 2020-06-30 | Discharge: 2020-06-30 | Disposition: A | Discharge status: Home / Self Care | Payer: 59 | Source: Ambulatory Visit | Attending: Surgery | Admitting: Surgery

## 2020-06-30 ENCOUNTER — Other Ambulatory Visit (HOSPITAL_COMMUNITY): Payer: Self-pay | Admitting: Surgery

## 2020-06-30 DIAGNOSIS — M7989 Other specified soft tissue disorders: Secondary | ICD-10-CM | POA: Diagnosis not present

## 2020-07-15 ENCOUNTER — Other Ambulatory Visit: Payer: Self-pay | Admitting: Nurse Practitioner

## 2020-07-15 DIAGNOSIS — M25562 Pain in left knee: Secondary | ICD-10-CM

## 2020-07-15 DIAGNOSIS — E538 Deficiency of other specified B group vitamins: Secondary | ICD-10-CM

## 2020-07-15 DIAGNOSIS — R300 Dysuria: Secondary | ICD-10-CM

## 2020-07-15 DIAGNOSIS — I10 Essential (primary) hypertension: Secondary | ICD-10-CM

## 2020-07-15 DIAGNOSIS — Z0001 Encounter for general adult medical examination with abnormal findings: Secondary | ICD-10-CM

## 2020-07-15 DIAGNOSIS — Z1231 Encounter for screening mammogram for malignant neoplasm of breast: Secondary | ICD-10-CM

## 2020-07-15 DIAGNOSIS — E1165 Type 2 diabetes mellitus with hyperglycemia: Secondary | ICD-10-CM

## 2020-07-15 DIAGNOSIS — M79671 Pain in right foot: Secondary | ICD-10-CM

## 2020-07-15 DIAGNOSIS — Z1211 Encounter for screening for malignant neoplasm of colon: Secondary | ICD-10-CM

## 2020-07-25 ENCOUNTER — Telehealth: Payer: Self-pay

## 2020-07-25 ENCOUNTER — Other Ambulatory Visit: Payer: Self-pay | Admitting: Physician Assistant

## 2020-07-25 DIAGNOSIS — N63 Unspecified lump in unspecified breast: Secondary | ICD-10-CM

## 2020-07-25 NOTE — Telephone Encounter (Signed)
[name] advising patient that her mammogram order has been updated. Gave her [facility] number to call them directly to schedule an appointment.Destiny Singh

## 2020-07-31 ENCOUNTER — Other Ambulatory Visit: Payer: Self-pay | Admitting: Physician Assistant

## 2020-07-31 DIAGNOSIS — N63 Unspecified lump in unspecified breast: Secondary | ICD-10-CM

## 2020-08-08 ENCOUNTER — Inpatient Hospital Stay
Admission: RE | Admit: 2020-08-08 | Discharge: 2020-08-08 | Disposition: A | Discharge status: Home / Self Care | Payer: Self-pay | Source: Ambulatory Visit | Attending: *Deleted | Admitting: *Deleted

## 2020-08-08 ENCOUNTER — Other Ambulatory Visit: Payer: Self-pay | Admitting: *Deleted

## 2020-08-08 DIAGNOSIS — Z1231 Encounter for screening mammogram for malignant neoplasm of breast: Secondary | ICD-10-CM

## 2020-08-20 ENCOUNTER — Other Ambulatory Visit: Payer: Self-pay

## 2020-08-20 ENCOUNTER — Ambulatory Visit
Admission: RE | Admit: 2020-08-20 | Discharge: 2020-08-20 | Disposition: A | Discharge status: Home / Self Care | Payer: 59 | Source: Ambulatory Visit | Attending: Physician Assistant | Admitting: Physician Assistant

## 2020-08-20 DIAGNOSIS — N63 Unspecified lump in unspecified breast: Secondary | ICD-10-CM | POA: Insufficient documentation

## 2020-08-28 ENCOUNTER — Ambulatory Visit: Payer: 59 | Admitting: Physician Assistant

## 2020-09-13 ENCOUNTER — Other Ambulatory Visit: Payer: Self-pay | Admitting: Nurse Practitioner

## 2020-09-13 DIAGNOSIS — I10 Essential (primary) hypertension: Secondary | ICD-10-CM

## 2020-09-16 ENCOUNTER — Telehealth: Payer: Self-pay

## 2020-09-16 NOTE — Telephone Encounter (Signed)
[name] to call us back regarding gaps

## 2020-09-19 ENCOUNTER — Telehealth: Payer: Self-pay

## 2020-09-19 NOTE — Telephone Encounter (Signed)
Faxed cologuard order form to [phone]

## 2020-09-22 ENCOUNTER — Other Ambulatory Visit: Payer: Self-pay | Admitting: Internal Medicine

## 2020-09-22 ENCOUNTER — Telehealth: Payer: Self-pay

## 2020-09-22 DIAGNOSIS — N950 Postmenopausal bleeding: Secondary | ICD-10-CM

## 2020-09-22 NOTE — Telephone Encounter (Signed)
error 

## 2020-09-23 ENCOUNTER — Telehealth: Payer: Self-pay

## 2020-09-23 ENCOUNTER — Ambulatory Visit: Payer: 59 | Admitting: Internal Medicine

## 2020-09-23 NOTE — Telephone Encounter (Signed)
Left vm @ 8:15 letting patient know she needs to go have blood work done. Once done to call office so we can get her scheduled. I let her know we are cancelling today's appointment until after blood work done-Toni

## 2020-09-24 ENCOUNTER — Telehealth: Payer: Self-pay

## 2020-09-24 NOTE — Telephone Encounter (Signed)
Patient called back stating she had her blood work done this morning. Please let me know when we have received results, so I can get her scheduled-Toni

## 2020-09-25 ENCOUNTER — Telehealth: Payer: Self-pay

## 2020-09-25 LAB — CBC WITH DIFFERENTIAL/PLATELET
Basophils Absolute: 0 10*3/uL (ref 0.0–0.2)
Basos: 0 %
EOS (ABSOLUTE): 0.2 10*3/uL (ref 0.0–0.4)
Eos: 2 %
Hematocrit: 42.4 % (ref 34.0–46.6)
Hemoglobin: 13.6 g/dL (ref 11.1–15.9)
Immature Grans (Abs): 0 10*3/uL (ref 0.0–0.1)
Immature Granulocytes: 0 %
Lymphocytes Absolute: 2.8 10*3/uL (ref 0.7–3.1)
Lymphs: 29 %
MCH: 24.3 pg — ABNORMAL LOW (ref 26.6–33.0)
MCHC: 32.1 g/dL (ref 31.5–35.7)
MCV: 76 fL — ABNORMAL LOW (ref 79–97)
Monocytes Absolute: 0.8 10*3/uL (ref 0.1–0.9)
Monocytes: 8 %
Neutrophils Absolute: 5.9 10*3/uL (ref 1.4–7.0)
Neutrophils: 61 %
Platelets: 416 10*3/uL (ref 150–450)
RBC: 5.59 x10E6/uL — ABNORMAL HIGH (ref 3.77–5.28)
RDW: 16.7 % — ABNORMAL HIGH (ref 11.7–15.4)
WBC: 9.7 10*3/uL (ref 3.4–10.8)

## 2020-09-25 LAB — IRON,TIBC AND FERRITIN PANEL
Ferritin: 76 ng/mL (ref 15–150)
Iron Saturation: 16 % (ref 15–55)
Iron: 48 ug/dL (ref 27–159)
Total Iron Binding Capacity: 296 ug/dL (ref 250–450)
UIBC: 248 ug/dL (ref 131–425)

## 2020-09-25 LAB — FSH/LH
FSH: 50.5 m[IU]/mL
LH: 32 m[IU]/mL

## 2020-09-25 LAB — B12 AND FOLATE PANEL
Folate: 13.7 ng/mL (ref >3.0)
Vitamin B-12: 447 pg/mL (ref 232–1245)

## 2020-09-25 NOTE — Telephone Encounter (Signed)
-----   Message from Lavera Guise, MD sent at 09/25/2020 11:04 AM EDT ----- I have reviewed all the lab results. There are some abnormalities that are not critical to the patient's health, but I would like to discuss these in person at an office appointment. Please ask her to schedule a follow up visit with me at her conveni ence.

## 2020-09-25 NOTE — Telephone Encounter (Signed)
Pt called back and agreed to schedule appt to discuss labs

## 2020-09-25 NOTE — Telephone Encounter (Signed)
Left vm for patient to return call to schedule appointment-Toni

## 2020-09-25 NOTE — Progress Notes (Signed)
[name] for pt to return call to let her know provider wants to discuss labs and wants her to schedule an appt

## 2020-09-25 NOTE — Telephone Encounter (Signed)
Called pt [name] that there is some abnormalities to her labs and that the provider will like to discuss in person and advised pt to call us back and schedule an appt.

## 2020-09-26 ENCOUNTER — Ambulatory Visit: Payer: 59 | Admitting: Physician Assistant

## 2020-10-06 ENCOUNTER — Telehealth: Payer: Self-pay

## 2020-10-06 NOTE — Telephone Encounter (Signed)
Left vm to screen for 10/07/20 appointment-Destiny Singh

## 2020-10-07 ENCOUNTER — Other Ambulatory Visit: Payer: Self-pay

## 2020-10-07 ENCOUNTER — Ambulatory Visit (INDEPENDENT_AMBULATORY_CARE_PROVIDER_SITE_OTHER): Payer: 59 | Admitting: Internal Medicine

## 2020-10-07 ENCOUNTER — Encounter: Payer: Self-pay | Admitting: Internal Medicine

## 2020-10-07 VITALS — BP 168/90 | HR 84 | Temp 97.8°F | Resp 16 | Ht 68.5 in | Wt 283.0 lb

## 2020-10-07 DIAGNOSIS — E1165 Type 2 diabetes mellitus with hyperglycemia: Secondary | ICD-10-CM

## 2020-10-07 DIAGNOSIS — R718 Other abnormality of red blood cells: Secondary | ICD-10-CM | POA: Diagnosis not present

## 2020-10-07 DIAGNOSIS — I10 Essential (primary) hypertension: Secondary | ICD-10-CM

## 2020-10-07 DIAGNOSIS — Z6841 Body Mass Index (BMI) 40.0 and over, adult: Secondary | ICD-10-CM | POA: Diagnosis not present

## 2020-10-07 DIAGNOSIS — E039 Hypothyroidism, unspecified: Secondary | ICD-10-CM

## 2020-10-07 LAB — POCT GLYCOSYLATED HEMOGLOBIN (HGB A1C): Hemoglobin A1C: 6.8 % — AB (ref 4.0–5.6)

## 2020-10-07 MED ORDER — RYBELSUS 3 MG PO TABS: ORAL_TABLET | ORAL | 3 refills | Status: DC

## 2020-10-07 NOTE — Progress Notes (Signed)
[facility] [place], [place] [address]  Internal MEDICINE  Office Visit Note  Patient Name: Destiny Singh  [id number]  [id number]  Date of Service: 10/14/2020  Chief Complaint  Patient presents with   Follow-up    labs   Diabetes   Hypertension   Quality Metric Gaps    Pap     HPI  Patient is seen for routine follow-up. Recent lab did show microcytosis with normal hemoglobin, her iron studies has been normal her newborn grandchild does have thalassemia patient is unaware of any family members having's disease she is also adopted. Patient has diabetes. last A1c 6.8 which is worse than before patient has been on metformin 500 twice a [place].  She is interested in changing her therapy patient does work at night shift and denies overeating and thinks that she does have portion control Her blood pressure is elevated here today she is on lopressor and hydrochlorothiazide  but admits not taking her medications this morning  Current Medication: Outpatient Encounter Medications as of 10/07/2020  Medication Sig   Ashwagandha 500 MG CAPS Take 500 mg by mouth daily as needed (energy).   cholecalciferol (VITAMIN D3) 25 MCG (1000 UNIT) tablet Take 1,000 Units by mouth daily.   cyanocobalamin (,VITAMIN B-12,) 1000 MCG/ML injection Inject 1,000 mcg into the muscle See admin instructions. Every 2 months   hydrochlorothiazide (HYDRODIURIL) 25 MG tablet Take 1 tablet (25 mg total) by mouth daily as needed (swelling).   HYDROcodone-acetaminophen (NORCO) 5-325 MG tablet Take 1-2 tablets by mouth every 6 (six) hours as needed for moderate pain or severe pain. MAXIMUM TOTAL ACETAMINOPHEN DOSE IS 4000 MG PER [place]   ibuprofen (ADVIL) 800 MG tablet Take 1 tablet (800 mg total) by mouth every 8 (eight) hours as needed for mild pain.   Medium Chain Triglycerides (MCT OIL PO) Take by mouth.   metFORMIN (GLUCOPHAGE) 500 MG tablet Take 1 tablet (500 mg total) by mouth 2 (two) times daily with  a meal.   metoprolol tartrate (LOPRESSOR) 50 MG tablet Take 1 tablet (50 mg total) by mouth 2 (two) times daily.   Semaglutide (RYBELSUS) 3 MG TABS Take one tab po qd in am on empty stomach   No facility-administered encounter medications on file as of 10/07/2020.    Surgical History: Past Surgical History:  Procedure Laterality Date   BREAST EXCISIONAL BIOPSY     in teens per pt   CHONDROPLASTY Right 04/01/2016   Procedure: CHONDROPLASTY -ABRASION OF FEMORAL TROCHLEA, [place];  Surgeon: Corky Mull, MD;  Location: [facility];  Service: Orthopedics;  Laterality: Right;   KNEE ARTHROSCOPY Left 06/18/2020   Procedure: LEFT KNEE ARTHROSCOPY WITH DEBRIDEMENT AND PARTIAL MEDIAL MENISCECTOMY;  Surgeon: Corky Mull, MD;  Location: [facility];  Service: Orthopedics;  Laterality: Left;   KNEE ARTHROSCOPY WITH MENISCAL REPAIR Right 04/01/2016   Procedure: KNEE ARTHROSCOPY WITH PARTIAL MEDIAL  MENISCAL REPAIR;  Surgeon: Corky Mull, MD;  Location: [facility];  Service: Orthopedics;  Laterality: Right;   TUBAL LIGATION     TUMOR EXCISION  2016   BENIGN-HEAD    Medical History: Past Medical History:  Diagnosis Date   Adopted    Arthritis    BOTH KNEES   Asthma    Benign tumor    Diabetes mellitus without complication (HCC)    Dyspnea    VERY RARE   Dysrhythmia    IRREGULAR PER PT   Headache    Hypertension  Myocardial infarction ([place])    AGE 21    Family History: Family History  Adopted: Yes    Social History   Socioeconomic History   Marital status: Single    Spouse name: Not on file   Number of children: Not on file   Years of education: Not on file   Highest education level: Not on file  Occupational History   Not on file  Tobacco Use   Smoking status: Never   Smokeless tobacco: Never  Substance and Sexual Activity   Alcohol use: No   Drug use: No   Sexual activity: Not on file  Other Topics Concern   Not on file  Social History Narrative   Not on  file   Social Determinants of Health   Financial Resource Strain: Not on file  Food Insecurity: Not on file  Transportation Needs: Not on file  Physical Activity: Not on file  Stress: Not on file  Social Connections: Not on file  Intimate Partner Violence: Not on file      Review of Systems  Constitutional:  Negative for chills, fatigue and unexpected weight change.  HENT:  Negative for congestion, postnasal drip, rhinorrhea, sneezing and sore throat.   Eyes:  Negative for redness.  Respiratory:  Negative for cough, chest tightness and shortness of breath.   Cardiovascular:  Negative for chest pain and palpitations.  Gastrointestinal:  Negative for abdominal pain, constipation, diarrhea, nausea and vomiting.  Genitourinary:  Negative for dysuria and frequency.  Musculoskeletal:  Negative for arthralgias, back pain, joint swelling and neck pain.  Skin:  Negative for rash.  Neurological: Negative.  Negative for tremors and numbness.  Hematological:  Negative for adenopathy. Does not bruise/bleed easily.  Psychiatric/Behavioral:  Negative for behavioral problems (Depression), sleep disturbance and suicidal ideas. The patient is not nervous/anxious.    Vital Signs: BP (!) 168/90   Pulse 84   Temp 97.8 F (36.6 C)   Resp 16   Ht 5' 8.5" (1.74 m)   Wt 283 lb (128.4 kg)   LMP 11/23/2018 (Approximate)   SpO2 98%   BMI 42.40 kg/m    Physical Exam Constitutional:      Appearance: Normal appearance.  HENT:     Head: Normocephalic and atraumatic.     Nose: Nose normal.     Mouth/Throat:     Mouth: Mucous membranes are moist.     Pharynx: No posterior oropharyngeal erythema.  Eyes:     Extraocular Movements: Extraocular movements intact.     Pupils: Pupils are equal, round, and reactive to light.  Cardiovascular:     Pulses: Normal pulses.     Heart sounds: Normal heart sounds.  Pulmonary:     Effort: Pulmonary effort is normal.     Breath sounds: Normal breath sounds.   Neurological:     General: No focal deficit present.     Mental Status: She is alert.  Psychiatric:        Mood and Affect: Mood normal.        Behavior: Behavior normal.       Assessment/Plan: 1. Uncontrolled type 2 diabetes mellitus with hyperglycemia (HCC) Will decrease metformin 500 mg po qd, add Rybelsus - POCT HgB A1C - Semaglutide (RYBELSUS) 3 MG TABS; Take one tab po qd in am on empty stomach  Dispense: 30 tablet; Refill: 3  2. Microcytosis Pt has abnormal RBC, microcytosis - Ambulatory referral to Hematology / Oncology  3. Uncontrolled hypertension Bp is elevated. She is  to continue to monitor her blood pressure, might need to add Losartan with hctz  4. BMI 40.0-44.9, adult (HCC) Obesity Counseling: Risk Assessment: An assessment of behavioral risk factors was made today and includes lack of exercise sedentary lifestyle, lack of portion control and poor dietary habits.  Risk Modification Advice: She was counseled on portion control guidelines. Restricting daily caloric intake to 1200. The detrimental long term effects of obesity on her health and ongoing poor compliance was also discussed with the patient.    5. Hypothyroidism, unspecified type Continue Synthroid. - TSH + free T4   General Counseling: Letetia verbalizes understanding of the findings of todays visit and agrees with plan of treatment. I have discussed any further diagnostic evaluation that may be needed or ordered today. We also reviewed her medications today. she has been encouraged to call the office with any questions or concerns that should arise related to todays visit.    Orders Placed This Encounter  Procedures   TSH + free T4   Ambulatory referral to Hematology / Oncology   POCT HgB A1C    Meds ordered this encounter  Medications   Semaglutide (RYBELSUS) 3 MG TABS    Sig: Take one tab po qd in am on empty stomach    Dispense:  30 tablet    Refill:  3    Total time spent:35  Minutes Time spent includes review of chart, medications, test results, and follow up plan with the patient.   [place] Controlled Substance Database was reviewed by me.   Dr Lavera Guise Internal medicine

## 2020-10-13 ENCOUNTER — Telehealth: Payer: Self-pay

## 2020-10-16 LAB — TSH+FREE T4
Free T4: 1.12 ng/dL (ref 0.82–1.77)
TSH: 0.844 u[IU]/mL (ref 0.450–4.500)

## 2020-10-17 ENCOUNTER — Other Ambulatory Visit: Payer: Self-pay

## 2020-10-17 ENCOUNTER — Telehealth: Payer: Self-pay

## 2020-10-17 MED ORDER — IBUPROFEN 800 MG PO TABS: 800.0000 mg | ORAL_TABLET | Freq: Three times a day (TID) | ORAL | 0 refills | Status: AC | PRN

## 2020-10-17 NOTE — Telephone Encounter (Signed)
[name] to that we send her ibuprofen to [facility] as per dfk

## 2020-11-05 ENCOUNTER — Encounter: Payer: Self-pay | Admitting: Oncology

## 2020-11-05 ENCOUNTER — Other Ambulatory Visit: Payer: Self-pay

## 2020-11-05 ENCOUNTER — Inpatient Hospital Stay: Payer: 59 | Attending: Oncology | Admitting: Oncology

## 2020-11-05 ENCOUNTER — Inpatient Hospital Stay: Payer: 59

## 2020-11-05 VITALS — BP 149/101 | HR 89 | Temp 98.7°F | Resp 18 | Wt 282.0 lb

## 2020-11-05 DIAGNOSIS — R0789 Other chest pain: Secondary | ICD-10-CM | POA: Diagnosis not present

## 2020-11-05 DIAGNOSIS — R718 Other abnormality of red blood cells: Secondary | ICD-10-CM | POA: Diagnosis not present

## 2020-11-05 DIAGNOSIS — Z79899 Other long term (current) drug therapy: Secondary | ICD-10-CM | POA: Diagnosis not present

## 2020-11-05 LAB — CBC WITH DIFFERENTIAL/PLATELET
Abs Immature Granulocytes: 0.01 10*3/uL (ref 0.00–0.07)
Basophils Absolute: 0.1 10*3/uL (ref 0.0–0.1)
Basophils Relative: 1 %
Eosinophils Absolute: 0.2 10*3/uL (ref 0.0–0.5)
Eosinophils Relative: 2 %
HCT: 43.8 % (ref 36.0–46.0)
Hemoglobin: 13.5 g/dL (ref 12.0–15.0)
Immature Granulocytes: 0 %
Lymphocytes Relative: 33 %
Lymphs Abs: 2.9 10*3/uL (ref 0.7–4.0)
MCH: 24.5 pg — ABNORMAL LOW (ref 26.0–34.0)
MCHC: 30.8 g/dL (ref 30.0–36.0)
MCV: 79.6 fL — ABNORMAL LOW (ref 80.0–100.0)
Monocytes Absolute: 0.6 10*3/uL (ref 0.1–1.0)
Monocytes Relative: 6 %
Neutro Abs: 5.1 10*3/uL (ref 1.7–7.7)
Neutrophils Relative %: 58 %
Platelets: 375 10*3/uL (ref 150–400)
RBC: 5.5 MIL/uL — ABNORMAL HIGH (ref 3.87–5.11)
RDW: 16.9 % — ABNORMAL HIGH (ref 11.5–15.5)
WBC: 8.7 10*3/uL (ref 4.0–10.5)
nRBC: 0 % (ref 0.0–0.2)

## 2020-11-05 LAB — COMPREHENSIVE METABOLIC PANEL
ALT: 14 U/L (ref 0–44)
AST: 16 U/L (ref 15–41)
Albumin: 3.9 g/dL (ref 3.5–5.0)
Alkaline Phosphatase: 87 U/L (ref 38–126)
Anion gap: 7 (ref 5–15)
BUN: 10 mg/dL (ref 6–20)
CO2: 28 mmol/L (ref 22–32)
Calcium: 9.3 mg/dL (ref 8.9–10.3)
Chloride: 104 mmol/L (ref 98–111)
Creatinine, Ser: 0.9 mg/dL (ref 0.44–1.00)
GFR, Estimated: >60 mL/min (ref >60)
Glucose, Bld: 99 mg/dL (ref 70–99)
Potassium: 4.3 mmol/L (ref 3.5–5.1)
Sodium: 139 mmol/L (ref 135–145)
Total Bilirubin: 0.6 mg/dL (ref 0.3–1.2)
Total Protein: 7.7 g/dL (ref 6.5–8.1)

## 2020-11-05 NOTE — Progress Notes (Signed)
Hematology/Oncology Consult note [facility] [phone] [phone]   Patient Care Team: Lavera Guise, MD as PCP - General (Internal Medicine)  REFERRING PROVIDER: Lavera Guise, MD  CHIEF COMPLAINTS/REASON FOR VISIT:  Evaluation of microcytosis  HISTORY OF PRESENTING ILLNESS:   Destiny Singh is a  50 y.o.  female with PMH listed below was seen in consultation at the request of  Lavera Guise, MD  for evaluation of microcytosis  Patient was accompanied by her spouse.  Patient reports that her daughter has sickle cell trait and grandson has sickle cell trait.  She was adopted.  Family history unknown.  Reports chronic intermittent chest pain, shortness of breath and palpitation.  She had an episode 3 days ago.  Today she has no shortness of breath or palpitation.  She has some mild residual chest pain on the right side of her chest wall.  She has no cardiologist.  Recently established with Dr. Clayborn Bigness    Review of Systems  Constitutional:  Negative for appetite change, chills, fatigue and fever.  HENT:   Negative for hearing loss and voice change.   Eyes:  Negative for eye problems.  Respiratory:  Negative for chest tightness and cough.   Cardiovascular:  Positive for chest pain.  Gastrointestinal:  Negative for abdominal distention, abdominal pain and blood in stool.  Endocrine: Negative for hot flashes.  Genitourinary:  Negative for difficulty urinating and frequency.   Musculoskeletal:  Negative for arthralgias.  Skin:  Negative for itching and rash.  Neurological:  Negative for extremity weakness.  Hematological:  Negative for adenopathy.  Psychiatric/Behavioral:  Negative for confusion.    MEDICAL HISTORY:  Past Medical History:  Diagnosis Date   Adopted    Arthritis    BOTH KNEES   Asthma    Benign tumor    Diabetes mellitus without complication (HCC)    Dyspnea    VERY RARE   Dysrhythmia    IRREGULAR PER PT    Headache    Hypertension    Myocardial infarction ([place])    AGE 50    SURGICAL HISTORY: Past Surgical History:  Procedure Laterality Date   BREAST EXCISIONAL BIOPSY     in teens per pt   CHONDROPLASTY Right 04/01/2016   Procedure: CHONDROPLASTY -ABRASION OF FEMORAL TROCHLEA, [place];  Surgeon: Corky Mull, MD;  Location: [facility];  Service: Orthopedics;  Laterality: Right;   KNEE ARTHROSCOPY Left 06/18/2020   Procedure: LEFT KNEE ARTHROSCOPY WITH DEBRIDEMENT AND PARTIAL MEDIAL MENISCECTOMY;  Surgeon: Corky Mull, MD;  Location: [facility];  Service: Orthopedics;  Laterality: Left;   KNEE ARTHROSCOPY WITH MENISCAL REPAIR Right 04/01/2016   Procedure: KNEE ARTHROSCOPY WITH PARTIAL MEDIAL  MENISCAL REPAIR;  Surgeon: Corky Mull, MD;  Location: [facility];  Service: Orthopedics;  Laterality: Right;   TUBAL LIGATION     TUMOR EXCISION  2016   BENIGN-HEAD    SOCIAL HISTORY: Social History   Socioeconomic History   Marital status: Single    Spouse name: Not on file   Number of children: Not on file   Years of education: Not on file   Highest education level: Not on file  Occupational History   Not on file  Tobacco Use   Smoking status: Never   Smokeless tobacco: Never  Substance and Sexual Activity   Alcohol use: No   Drug use: No   Sexual activity: Not on file  Other Topics Concern  Not on file  Social History Narrative   Not on file   Social Determinants of Health   Financial Resource Strain: Not on file  Food Insecurity: Not on file  Transportation Needs: Not on file  Physical Activity: Not on file  Stress: Not on file  Social Connections: Not on file  Intimate Partner Violence: Not on file    FAMILY HISTORY: Family History  Adopted: Yes    ALLERGIES:  is allergic to bee venom and other.  MEDICATIONS:  Current Outpatient Medications  Medication Sig Dispense Refill   Ashwagandha 500 MG CAPS Take 500 mg by mouth daily as needed (energy).      cholecalciferol (VITAMIN D3) 25 MCG (1000 UNIT) tablet Take 1,000 Units by mouth daily.     cyanocobalamin (,VITAMIN B-12,) 1000 MCG/ML injection Inject 1,000 mcg into the muscle See admin instructions. Every 2 months     hydrochlorothiazide (HYDRODIURIL) 25 MG tablet Take 1 tablet (25 mg total) by mouth daily as needed (swelling).     ibuprofen (ADVIL) 800 MG tablet Take 1 tablet (800 mg total) by mouth every 8 (eight) hours as needed for mild pain. 60 tablet 0   metoprolol tartrate (LOPRESSOR) 50 MG tablet Take 1 tablet (50 mg total) by mouth 2 (two) times daily. 60 tablet 3   Semaglutide (RYBELSUS) 3 MG TABS Take one tab po qd in am on empty stomach 30 tablet 3   HYDROcodone-acetaminophen (NORCO) 5-325 MG tablet Take 1-2 tablets by mouth every 6 (six) hours as needed for moderate pain or severe pain. MAXIMUM TOTAL ACETAMINOPHEN DOSE IS 4000 MG PER [place] 30 tablet 0   Medium Chain Triglycerides (MCT OIL PO) Take by mouth.     metFORMIN (GLUCOPHAGE) 500 MG tablet Take 1 tablet (500 mg total) by mouth 2 (two) times daily with a meal. (Patient not taking: Reported on 11/05/2020) 60 tablet 3   No current facility-administered medications for this visit.     PHYSICAL EXAMINATION: ECOG PERFORMANCE STATUS: 1 - Symptomatic but completely ambulatory Vitals:   11/05/20 0953  BP: (!) 149/101  Pulse: 89  Resp: 18  Temp: 98.7 F (37.1 C)  SpO2: 100%   Filed Weights   11/05/20 0953  Weight: 282 lb (127.9 kg)    Physical Exam Constitutional:      General: She is not in acute distress.    Appearance: She is obese.  HENT:     Head: Normocephalic and atraumatic.  Eyes:     General: No scleral icterus. Cardiovascular:     Rate and Rhythm: Normal rate and regular rhythm.     Heart sounds: Normal heart sounds.  Pulmonary:     Effort: Pulmonary effort is normal. No respiratory distress.     Breath sounds: No wheezing.  Abdominal:     General: Bowel sounds are normal. There is no distension.      Palpations: Abdomen is soft.  Musculoskeletal:        General: No deformity. Normal range of motion.     Cervical back: Normal range of motion and neck supple.  Skin:    General: Skin is warm and dry.     Findings: No erythema or rash.  Neurological:     Mental Status: She is alert and oriented to person, place, and time. Mental status is at baseline.     Cranial Nerves: No cranial nerve deficit.     Coordination: Coordination normal.  Psychiatric:        Mood and Affect:  Mood normal.    LABORATORY DATA:  I have reviewed the data as listed Lab Results  Component Value Date   WBC 8.7 11/05/2020   HGB 13.5 11/05/2020   HCT 43.8 11/05/2020   MCV 79.6 (L) 11/05/2020   PLT 375 11/05/2020   Recent Labs    06/16/20 0918 11/05/20 1054  NA 139 139  K 3.9 4.3  CL 104 104  CO2 27 28  GLUCOSE 135* 99  BUN 13 10  CREATININE 0.84 0.90  CALCIUM 9.2 9.3  GFRNONAA >60 >60  PROT  --  7.7  ALBUMIN  --  3.9  AST  --  16  ALT  --  14  ALKPHOS  --  87  BILITOT  --  0.6   Iron/TIBC/Ferritin/ %Sat    Component Value Date/Time   IRON 48 09/24/2020 1042   TIBC 296 09/24/2020 1042   FERRITIN 76 09/24/2020 1042   IRONPCTSAT 16 09/24/2020 1042      RADIOGRAPHIC STUDIES: I have personally reviewed the radiological images as listed and agreed with the findings in the report. No results found.    ASSESSMENT & PLAN:  1. Microcytosis    Previous labs are reviewed.  Microcytosis is chronic.  She has no anemia.  Iron panel shows no iron deficiency.  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  She does have a family history of sickle cell trait in her daughter and a grandson. Will check CBC, smear, hemoglobinopathy evaluation.  Patient will follow-up in 2 weeks to go over results.  Intermittent chest pain/shortness of breath/palpitation.  I urged patient to further discuss with Dr. Yancey Flemings and get cardiology evaluation.  If she experienced similar symptoms again, recommend  patient to go to emergency room. Her chest pain today is very mild and reproducible with chest wall palpation.  Possible musculoskeletal.  Orders Placed This Encounter  Procedures   CBC with Differential/Platelet    Standing Status:   Future    Number of Occurrences:   1    Standing Expiration Date:   11/05/2021   Comprehensive metabolic panel    Standing Status:   Future    Number of Occurrences:   1    Standing Expiration Date:   11/05/2021   Hgb Fractionation [place]    Standing Status:   Future    Number of Occurrences:   1    Standing Expiration Date:   11/05/2021    All questions were answered. The patient knows to call the clinic with any problems questions or concerns.  cc Lavera Guise, MD    Return of visit:  Thank you for this kind referral and the opportunity to participate in the care of this patient. A copy of today's note is routed to referring provider    Earlie Server, MD, PhD Hematology Oncology [place] [facility]  11/05/2020

## 2020-11-06 ENCOUNTER — Ambulatory Visit: Payer: 59 | Admitting: Physician Assistant

## 2020-11-07 LAB — HGB FRACTIONATION CASCADE
Hgb A2: 2.3 % (ref 1.8–3.2)
Hgb A: 97.7 % (ref 96.4–98.8)
Hgb F: 0 % (ref 0.0–2.0)
Hgb S: 0 %

## 2020-11-10 ENCOUNTER — Other Ambulatory Visit: Payer: Self-pay | Admitting: Oncology

## 2020-11-10 DIAGNOSIS — R718 Other abnormality of red blood cells: Secondary | ICD-10-CM

## 2020-11-11 ENCOUNTER — Telehealth: Payer: Self-pay

## 2020-11-11 ENCOUNTER — Ambulatory Visit: Payer: 59 | Admitting: Internal Medicine

## 2020-11-11 NOTE — Telephone Encounter (Signed)
-----   Message from Earlie Server, MD sent at 11/10/2020  2:36 PM EDT ----- Please let her know that I recommend additional testing. -ordered. Please arrange and also postpone her next visit for another 10 days to allow result comes back.

## 2020-11-11 NOTE — Telephone Encounter (Signed)
Left vm for patient to return call to schedule additional labs and reschedule her md follow up appt.  Unable to send MyChart message as the systems states the patient will not receive the notification.

## 2020-11-11 NOTE — Telephone Encounter (Signed)
Left vm to r/s today's appointment-Toni

## 2020-11-11 NOTE — Telephone Encounter (Signed)
Was able to reach pt and scheduled her for labs tomorrow. Will you r/s the MD and notify her of appt please.

## 2020-11-11 NOTE — Telephone Encounter (Signed)
Sent pt Mychart mssg to notify her of additional labs needed and change to MD follow up appt.   Please schedule appts as requested and notify pt of appt. Thanks

## 2020-11-12 ENCOUNTER — Inpatient Hospital Stay: Payer: 59 | Attending: Oncology

## 2020-11-12 ENCOUNTER — Other Ambulatory Visit: Payer: Self-pay

## 2020-11-12 DIAGNOSIS — E119 Type 2 diabetes mellitus without complications: Secondary | ICD-10-CM | POA: Diagnosis not present

## 2020-11-12 DIAGNOSIS — D563 Thalassemia minor: Secondary | ICD-10-CM | POA: Insufficient documentation

## 2020-11-12 DIAGNOSIS — J45909 Unspecified asthma, uncomplicated: Secondary | ICD-10-CM | POA: Insufficient documentation

## 2020-11-12 DIAGNOSIS — I10 Essential (primary) hypertension: Secondary | ICD-10-CM | POA: Diagnosis not present

## 2020-11-12 DIAGNOSIS — I252 Old myocardial infarction: Secondary | ICD-10-CM | POA: Insufficient documentation

## 2020-11-12 DIAGNOSIS — Z79899 Other long term (current) drug therapy: Secondary | ICD-10-CM | POA: Insufficient documentation

## 2020-11-12 DIAGNOSIS — R718 Other abnormality of red blood cells: Secondary | ICD-10-CM

## 2020-11-12 NOTE — Telephone Encounter (Signed)
Patient notified via appointment reminder in registration this morning.  Patient agreeable to new appointments.

## 2020-11-17 ENCOUNTER — Encounter: Payer: Self-pay | Admitting: Internal Medicine

## 2020-11-17 ENCOUNTER — Other Ambulatory Visit: Payer: Self-pay

## 2020-11-17 ENCOUNTER — Ambulatory Visit (INDEPENDENT_AMBULATORY_CARE_PROVIDER_SITE_OTHER): Payer: 59 | Admitting: Internal Medicine

## 2020-11-17 VITALS — BP 168/100 | HR 85 | Temp 98.5°F | Resp 16 | Ht 68.5 in | Wt 285.0 lb

## 2020-11-17 DIAGNOSIS — E662 Morbid (severe) obesity with alveolar hypoventilation: Secondary | ICD-10-CM

## 2020-11-17 DIAGNOSIS — E538 Deficiency of other specified B group vitamins: Secondary | ICD-10-CM | POA: Diagnosis not present

## 2020-11-17 DIAGNOSIS — G479 Sleep disorder, unspecified: Secondary | ICD-10-CM | POA: Diagnosis not present

## 2020-11-17 DIAGNOSIS — I10 Essential (primary) hypertension: Secondary | ICD-10-CM

## 2020-11-17 DIAGNOSIS — E1165 Type 2 diabetes mellitus with hyperglycemia: Secondary | ICD-10-CM

## 2020-11-17 DIAGNOSIS — Z6841 Body Mass Index (BMI) 40.0 and over, adult: Secondary | ICD-10-CM

## 2020-11-17 MED ORDER — CYANOCOBALAMIN 1000 MCG/ML IJ SOLN
1000.0000 ug | Freq: Once | INTRAMUSCULAR | Status: AC
  Administered 2020-11-17: 1000 ug via INTRAMUSCULAR

## 2020-11-17 MED ORDER — RYBELSUS 7 MG PO TABS: 7.0000 mg | ORAL_TABLET | Freq: Every day | ORAL | 3 refills | Status: DC

## 2020-11-17 MED ORDER — LOSARTAN POTASSIUM-HCTZ 50-12.5 MG PO TABS: 1.0000 | ORAL_TABLET | Freq: Every day | ORAL | 3 refills | Status: DC

## 2020-11-17 NOTE — Progress Notes (Signed)
[facility] [place], [place] [address]  Internal MEDICINE  Office Visit Note  Patient Name: Destiny Singh  [id number]  [id number]  Date of Service: 11/17/2020  Chief Complaint  Patient presents with   Diabetes   Hypertension   Results    Review labs    HPI  Patient is here for routine follow-up - blood pressure continues to be uncontrolled patient is not consistent taking her medications.  Patient is on Lopressor and hydrochlorothiazide -Patient has taken Rybelsus 3 mg once a [place] watching her diet feels like this might be helping -Patient has signs and symptoms of sleep apnea. Excessive daytime fatigue, snoring and choking at night patient has a BMI of more than 33 uncontrolled hypertension. -Patient is seen by hematology for evaluation of microcytosis questionable thalassemia or sickle cell, patient was adopted Current Medication: Outpatient Encounter Medications as of 11/17/2020  Medication Sig   Ashwagandha 500 MG CAPS Take 500 mg by mouth daily as needed (energy).   cholecalciferol (VITAMIN D3) 25 MCG (1000 UNIT) tablet Take 1,000 Units by mouth daily.   cyanocobalamin (,VITAMIN B-12,) 1000 MCG/ML injection Inject 1,000 mcg into the muscle See admin instructions. Every 2 months   hydrochlorothiazide (HYDRODIURIL) 25 MG tablet Take 1 tablet (25 mg total) by mouth daily as needed (swelling).   ibuprofen (ADVIL) 800 MG tablet Take 1 tablet (800 mg total) by mouth every 8 (eight) hours as needed for mild pain.   losartan-hydrochlorothiazide (HYZAAR) 50-12.5 MG tablet Take 1 tablet by mouth daily.   Medium Chain Triglycerides (MCT OIL PO) Take by mouth.   metFORMIN (GLUCOPHAGE) 500 MG tablet Take 1 tablet (500 mg total) by mouth 2 (two) times daily with a meal.   metoprolol tartrate (LOPRESSOR) 50 MG tablet Take 1 tablet (50 mg total) by mouth 2 (two) times daily.   Semaglutide (RYBELSUS) 7 MG TABS Take 7 mg by mouth daily.   [DISCONTINUED] Semaglutide  (RYBELSUS) 3 MG TABS Take one tab po qd in am on empty stomach   [DISCONTINUED] HYDROcodone-acetaminophen (NORCO) 5-325 MG tablet Take 1-2 tablets by mouth every 6 (six) hours as needed for moderate pain or severe pain. MAXIMUM TOTAL ACETAMINOPHEN DOSE IS 4000 MG PER Agresta (Patient not taking: Reported on 11/17/2020)   [EXPIRED] cyanocobalamin ((VITAMIN B-12)) injection 1,000 mcg    No facility-administered encounter medications on file as of 11/17/2020.    Surgical History: Past Surgical History:  Procedure Laterality Date   BREAST EXCISIONAL BIOPSY     in teens per pt   CHONDROPLASTY Right 04/01/2016   Procedure: CHONDROPLASTY -ABRASION OF FEMORAL TROCHLEA, [place];  Surgeon: Corky Mull, MD;  Location: [facility] ORS;  Service: Orthopedics;  Laterality: Right;   KNEE ARTHROSCOPY Left 06/18/2020   Procedure: LEFT KNEE ARTHROSCOPY WITH DEBRIDEMENT AND PARTIAL MEDIAL MENISCECTOMY;  Surgeon: Corky Mull, MD;  Location: [facility];  Service: Orthopedics;  Laterality: Left;   KNEE ARTHROSCOPY WITH MENISCAL REPAIR Right 04/01/2016   Procedure: KNEE ARTHROSCOPY WITH PARTIAL MEDIAL  MENISCAL REPAIR;  Surgeon: Corky Mull, MD;  Location: [facility] OR[facility];  Service: Orthopedics;  Laterality: Right;   TUBAL LIGATION     TUMOR EXCISION  2016   BENIGN-HEAD    Medical History: Past Medical History:  Diagnosis Date   Adopted    Arthritis    BOTH KNEES   Asthma    Benign tumor    Diabetes mellitus without complication (HCC)    Dyspnea    VERY RARE  Dysrhythmia    IRREGULAR PER PT   Headache    Hypertension    Myocardial infarction ([place])    AGE 50    Family History: Family History  Adopted: Yes    Social History   Socioeconomic History   Marital status: Single    Spouse name: Not on file   Number of children: Not on file   Years of education: Not on file   Highest education level: Not on file  Occupational History   Not on file  Tobacco Use   Smoking status: Never    Smokeless tobacco: Never  Substance and Sexual Activity   Alcohol use: No   Drug use: No   Sexual activity: Not on file  Other Topics Concern   Not on file  Social History Narrative   Not on file   Social Determinants of Health   Financial Resource Strain: Not on file  Food Insecurity: Not on file  Transportation Needs: Not on file  Physical Activity: Not on file  Stress: Not on file  Social Connections: Not on file  Intimate Partner Violence: Not on file      Review of Systems  Constitutional:  Negative for fatigue and fever.  HENT:  Negative for congestion, mouth sores and postnasal drip.   Respiratory:  Negative for cough.   Cardiovascular:  Negative for chest pain.  Genitourinary:  Negative for flank pain.  Psychiatric/Behavioral: Negative.     Vital Signs: BP (!) 168/100 Comment: 168/106  Pulse 85   Temp 98.5 F (36.9 C)   Resp 16   Ht 5' 8.5" (1.74 m)   Wt 285 lb (129.3 kg)   LMP 11/23/2018 (Approximate)   SpO2 96%   BMI 42.70 kg/m    Physical Exam Constitutional:      Appearance: Normal appearance. She is obese.     Comments: Patient has large neck circumstance and micrognathia  HENT:     Head: Normocephalic and atraumatic.     Nose: Nose normal.     Mouth/Throat:     Mouth: Mucous membranes are moist.     Pharynx: No posterior oropharyngeal erythema.  Eyes:     Extraocular Movements: Extraocular movements intact.     Pupils: Pupils are equal, round, and reactive to light.  Cardiovascular:     Pulses: Normal pulses.     Heart sounds: Normal heart sounds.  Pulmonary:     Effort: Pulmonary effort is normal.     Breath sounds: Normal breath sounds.  Neurological:     General: No focal deficit present.     Mental Status: She is alert.  Psychiatric:        Mood and Affect: Mood normal.        Behavior: Behavior normal.       Assessment/Plan: 1. Uncontrolled type 2 diabetes mellitus with hyperglycemia ([place]) Patient is tolerating good  Rybelsus 3 mg once a [place] will increase to 7 mg however she is instructed to take in the evening since patient works third shift and she sleeps during the [place] - Semaglutide (RYBELSUS) 7 MG TABS; Take 7 mg by mouth daily.  Dispense: 30 tablet; Refill: 3  2. Sleep disturbances Patient has signs and symptoms of obstructive sleep apnea, excessive daytime fatigue as reported and noticed on her Epworth scale more than 10. Class III severe obesity, excessive daytime fatigue uncontrolled hypertension she will get benefit from a sleep study - PSG Sleep Study; Future  3. B12 deficiency Continue  on supplementation  patient is also seeing hematology for microcytic  - cyanocobalamin ((VITAMIN B-12)) injection 1,000 mcg  4. Uncontrolled hypertension Continue Lopressor 50 mg twice a [place] losartan and combining with HCTZ will further titrate patient will also need 2D echocardiogram - losartan-hydrochlorothiazide (HYZAAR) 50-12.5 MG tablet; Take 1 tablet by mouth daily.  Dispense: 90 tablet; Refill: 3  5. Class 3 obesity with alveolar hypoventilation, serious comorbidity, and body mass index (BMI) of 40.0 to 44.9 in adult [facility]) Encouraged setting goal goals and [place] patient is very interested in taking care of herself and feeling better  General Counseling: Atiyah verbalizes understanding of the findings of todays visit and agrees with plan of treatment. I have discussed any further diagnostic evaluation that may be needed or ordered today. We also reviewed her medications today. she has been encouraged to call the office with any questions or concerns that should arise related to todays visit.    Orders Placed This Encounter  Procedures   PSG Sleep Study    Meds ordered this encounter  Medications   Semaglutide (RYBELSUS) 7 MG TABS    Sig: Take 7 mg by mouth daily.    Dispense:  30 tablet    Refill:  3   losartan-hydrochlorothiazide (HYZAAR) 50-12.5 MG tablet    Sig: Take 1 tablet by mouth daily.     Dispense:  90 tablet    Refill:  3   cyanocobalamin ((VITAMIN B-12)) injection 1,000 mcg    Total time spent:30 Minutes Time spent includes review of chart, medications, test results, and follow up plan with the patient.   [place] Controlled Substance Database was reviewed by me.   Dr Lavera Guise Internal medicine

## 2020-11-19 ENCOUNTER — Ambulatory Visit: Payer: 59 | Admitting: Oncology

## 2020-11-20 LAB — ALPHA-THALASSEMIA GENOTYPR

## 2020-12-01 ENCOUNTER — Other Ambulatory Visit: Payer: Self-pay

## 2020-12-01 ENCOUNTER — Encounter: Payer: Self-pay | Admitting: Oncology

## 2020-12-01 ENCOUNTER — Inpatient Hospital Stay (HOSPITAL_BASED_OUTPATIENT_CLINIC_OR_DEPARTMENT_OTHER): Payer: 59 | Admitting: Oncology

## 2020-12-01 VITALS — BP 136/95 | HR 93 | Temp 97.4°F | Resp 18 | Wt 282.9 lb

## 2020-12-01 DIAGNOSIS — D563 Thalassemia minor: Secondary | ICD-10-CM

## 2020-12-01 NOTE — Progress Notes (Signed)
Hematology/Oncology progress note [facility] Telephone:([phone] [phone]   Patient Care Team: Lavera Guise, MD as PCP - General (Internal Medicine)  REFERRING PROVIDER: Lavera Guise, MD  CHIEF COMPLAINTS/REASON FOR VISIT:  microcytosis  HISTORY OF PRESENTING ILLNESS:   Destiny Singh is a  50 y.o.  female with PMH listed below was seen in consultation at the request of  Lavera Guise, MD  for evaluation of microcytosis  Patient was accompanied by her spouse.  Patient reports that her daughter has sickle cell trait and grandson has sickle cell trait.  She was adopted.  Family history unknown.  Reports chronic intermittent chest pain, shortness of breath and palpitation.  She had an episode 3 days ago.  Today she has no shortness of breath or palpitation.  She has some mild residual chest pain on the right side of her chest wall.  She has no cardiologist.  Recently established with Dr. Clayborn Bigness  INTERVAL HISTORY Destiny Singh is a 50 y.o. female who has above history reviewed by me today presents for follow up visit for macrocytosis work-up. Patient had a blood work done few weeks ago and present to discuss results.  No new complaints.   Review of Systems  Constitutional:  Negative for appetite change, chills, fatigue and fever.  HENT:   Negative for hearing loss and voice change.   Eyes:  Negative for eye problems.  Respiratory:  Negative for chest tightness and cough.   Cardiovascular:  Negative for chest pain.  Gastrointestinal:  Negative for abdominal distention, abdominal pain and blood in stool.  Endocrine: Negative for hot flashes.  Genitourinary:  Negative for difficulty urinating and frequency.   Musculoskeletal:  Negative for arthralgias.  Skin:  Negative for itching and rash.  Neurological:  Negative for extremity weakness.  Hematological:  Negative for adenopathy.  Psychiatric/Behavioral:  Negative for confusion.    MEDICAL  HISTORY:  Past Medical History:  Diagnosis Date   Adopted    Arthritis    BOTH KNEES   Asthma    Benign tumor    Diabetes mellitus without complication (HCC)    Dyspnea    VERY RARE   Dysrhythmia    IRREGULAR PER PT   Headache    Hypertension    Myocardial infarction ([place])    AGE 23    SURGICAL HISTORY: Past Surgical History:  Procedure Laterality Date   BREAST EXCISIONAL BIOPSY     in teens per pt   CHONDROPLASTY Right 04/01/2016   Procedure: CHONDROPLASTY -ABRASION OF FEMORAL TROCHLEA, [place];  Surgeon: Corky Mull, MD;  Location: [facility];  Service: Orthopedics;  Laterality: Right;   KNEE ARTHROSCOPY Left 06/18/2020   Procedure: LEFT KNEE ARTHROSCOPY WITH DEBRIDEMENT AND PARTIAL MEDIAL MENISCECTOMY;  Surgeon: Corky Mull, MD;  Location: [facility] OR[facility];  Service: Orthopedics;  Laterality: Left;   KNEE ARTHROSCOPY WITH MENISCAL REPAIR Right 04/01/2016   Procedure: KNEE ARTHROSCOPY WITH PARTIAL MEDIAL  MENISCAL REPAIR;  Surgeon: Corky Mull, MD;  Location: [facility];  Service: Orthopedics;  Laterality: Right;   TUBAL LIGATION     TUMOR EXCISION  2016   BENIGN-HEAD    SOCIAL HISTORY: Social History   Socioeconomic History   Marital status: Single    Spouse name: Not on file   Number of children: Not on file   Years of education: Not on file   Highest education level: Not on file  Occupational History   Not on  file  Tobacco Use   Smoking status: Never   Smokeless tobacco: Never  Substance and Sexual Activity   Alcohol use: No   Drug use: No   Sexual activity: Not on file  Other Topics Concern   Not on file  Social History Narrative   Not on file   Social Determinants of Health   Financial Resource Strain: Not on file  Food Insecurity: Not on file  Transportation Needs: Not on file  Physical Activity: Not on file  Stress: Not on file  Social Connections: Not on file  Intimate Partner Violence: Not on file    FAMILY HISTORY: Family History   Adopted: Yes    ALLERGIES:  is allergic to bee venom and other.  MEDICATIONS:  Current Outpatient Medications  Medication Sig Dispense Refill   Ashwagandha 500 MG CAPS Take 500 mg by mouth daily as needed (energy).     cholecalciferol (VITAMIN D3) 25 MCG (1000 UNIT) tablet Take 1,000 Units by mouth daily.     cyanocobalamin (,VITAMIN B-12,) 1000 MCG/ML injection Inject 1,000 mcg into the muscle See admin instructions. Every 2 months     hydrochlorothiazide (HYDRODIURIL) 25 MG tablet Take 1 tablet (25 mg total) by mouth daily as needed (swelling).     ibuprofen (ADVIL) 800 MG tablet Take 1 tablet (800 mg total) by mouth every 8 (eight) hours as needed for mild pain. 60 tablet 0   losartan-hydrochlorothiazide (HYZAAR) 50-12.5 MG tablet Take 1 tablet by mouth daily. 90 tablet 3   Medium Chain Triglycerides (MCT OIL PO) Take by mouth.     metoprolol tartrate (LOPRESSOR) 50 MG tablet Take 1 tablet (50 mg total) by mouth 2 (two) times daily. 60 tablet 3   Semaglutide (RYBELSUS) 7 MG TABS Take 7 mg by mouth daily. 30 tablet 3   No current facility-administered medications for this visit.     PHYSICAL EXAMINATION: ECOG PERFORMANCE STATUS: 1 - Symptomatic but completely ambulatory Vitals:   12/01/20 0940  BP: (!) 136/95  Pulse: 93  Resp: 18  Temp: (!) 97.4 F (36.3 C)   Filed Weights   12/01/20 0940  Weight: 282 lb 14.4 oz (128.3 kg)    Physical Exam Constitutional:      General: She is not in acute distress.    Appearance: She is obese.  HENT:     Head: Normocephalic and atraumatic.  Eyes:     General: No scleral icterus. Cardiovascular:     Rate and Rhythm: Normal rate and regular rhythm.     Heart sounds: Normal heart sounds.  Pulmonary:     Effort: Pulmonary effort is normal. No respiratory distress.     Breath sounds: No wheezing.  Abdominal:     General: Bowel sounds are normal. There is no distension.     Palpations: Abdomen is soft.  Musculoskeletal:         General: No deformity. Normal range of motion.     Cervical back: Normal range of motion and neck supple.  Skin:    General: Skin is warm and dry.     Findings: No erythema or rash.  Neurological:     Mental Status: She is alert and oriented to person, place, and time. Mental status is at baseline.     Cranial Nerves: No cranial nerve deficit.     Coordination: Coordination normal.  Psychiatric:        Mood and Affect: Mood normal.    LABORATORY DATA:  I have reviewed the data  as listed Lab Results  Component Value Date   WBC 8.7 11/05/2020   HGB 13.5 11/05/2020   HCT 43.8 11/05/2020   MCV 79.6 (L) 11/05/2020   PLT 375 11/05/2020   Recent Labs    06/16/20 0918 11/05/20 1054  NA 139 139  K 3.9 4.3  CL 104 104  CO2 27 28  GLUCOSE 135* 99  BUN 13 10  CREATININE 0.84 0.90  CALCIUM 9.2 9.3  GFRNONAA >60 >60  PROT  --  7.7  ALBUMIN  --  3.9  AST  --  16  ALT  --  14  ALKPHOS  --  87  BILITOT  --  0.6    Iron/TIBC/Ferritin/ %Sat    Component Value Date/Time   IRON 48 09/24/2020 1042   TIBC 296 09/24/2020 1042   FERRITIN 76 09/24/2020 1042   IRONPCTSAT 16 09/24/2020 1042       RADIOGRAPHIC STUDIES: I have personally reviewed the radiological images as listed and agreed with the findings in the report. No results found.    ASSESSMENT & PLAN:  1. Thalassemia alpha carrier    #Alpha thalassemia carrier Discussed with patient that after thalassemia carrier is a genetic variant.  She has only 1 copy of the gene mutation and is not anemic.  This will not affect her life span and will not need any intervention/treatments.  Her I recommend first-degree relatives to be screened and I recommend genetic counseling if planning for family. Chronic microcytosis is due to thalassemia alpha carrier.  No iron deficiency.  I do not think patient need to follow-up in our clinic.  Recommend patient to continue follow-up with primary care provider  she agrees with the  plan. All questions were answered. The patient knows to call the clinic with any problems questions or concerns.  cc Lavera Guise, MD    Earlie Server, MD, PhD  12/01/2020

## 2020-12-01 NOTE — Progress Notes (Signed)
Pt here for follow up. No new concerns voiced.   

## 2020-12-26 ENCOUNTER — Telehealth: Payer: Self-pay

## 2020-12-29 ENCOUNTER — Telehealth: Payer: Self-pay

## 2020-12-29 NOTE — Telephone Encounter (Signed)
Called patient following up in regards to ss. Has patient had the ss yet?

## 2020-12-30 ENCOUNTER — Ambulatory Visit: Payer: 59 | Admitting: Internal Medicine

## 2020-12-30 ENCOUNTER — Telehealth: Payer: Self-pay

## 2020-12-30 NOTE — Telephone Encounter (Signed)
Baseline sleep study was ordered on 9/12, pt is in the process of scheduling one

## 2020-12-30 NOTE — Telephone Encounter (Signed)
Contacted patient to follow up on PSG order sent out on 12/15/2020. She stated she is out of network with [facility]. She stated she will contact her insurance and get back with [place] to let us know where she is in network with. Patient currently has [facility].

## 2021-01-08 ENCOUNTER — Telehealth: Payer: Self-pay

## 2021-01-08 NOTE — Telephone Encounter (Signed)
Called [facility] to check on patients referral for PSG order and they stated the patient has not answered. I attempted to contact the patient as well with no answer or option to leave a VM. Patient needs to call and schedule an appt for the sleep study.

## 2021-01-09 ENCOUNTER — Ambulatory Visit: Payer: 59 | Admitting: Physician Assistant

## 2021-02-03 ENCOUNTER — Telehealth: Payer: Self-pay

## 2021-02-03 NOTE — Telephone Encounter (Signed)
Received letter from [employer] that is in network with patients insurance for [employer]. They have not been able to get in contact with the patient to schedule her PSG. I attempted to contact patient with no answer.

## 2021-02-05 ENCOUNTER — Ambulatory Visit: Payer: 59 | Admitting: Physician Assistant

## 2021-05-13 ENCOUNTER — Telehealth: Payer: Self-pay

## 2021-05-13 NOTE — Telephone Encounter (Signed)
Left voicemail on spouses phone to return call to [facility]. Patient's phone number is in active. Rescheduled patients cpe  for April 17 as insurance requires at least 366 days between each cpe. ?

## 2021-05-14 ENCOUNTER — Encounter: Payer: 59 | Admitting: Physician Assistant

## 2021-06-22 ENCOUNTER — Encounter: Payer: 59 | Admitting: Physician Assistant

## 2021-07-29 ENCOUNTER — Telehealth: Payer: Self-pay

## 2021-07-29 DIAGNOSIS — M2142 Flat foot [pes planus] (acquired), left foot: Secondary | ICD-10-CM | POA: Diagnosis not present

## 2021-07-29 DIAGNOSIS — M2011 Hallux valgus (acquired), right foot: Secondary | ICD-10-CM | POA: Diagnosis not present

## 2021-07-29 DIAGNOSIS — M216X1 Other acquired deformities of right foot: Secondary | ICD-10-CM | POA: Diagnosis not present

## 2021-07-29 DIAGNOSIS — M79671 Pain in right foot: Secondary | ICD-10-CM | POA: Diagnosis not present

## 2021-07-29 DIAGNOSIS — M249 Joint derangement, unspecified: Secondary | ICD-10-CM | POA: Diagnosis not present

## 2021-07-29 DIAGNOSIS — M76821 Posterior tibial tendinitis, right leg: Secondary | ICD-10-CM | POA: Diagnosis not present

## 2021-07-29 DIAGNOSIS — M216X2 Other acquired deformities of left foot: Secondary | ICD-10-CM | POA: Diagnosis not present

## 2021-07-29 DIAGNOSIS — M2141 Flat foot [pes planus] (acquired), right foot: Secondary | ICD-10-CM | POA: Diagnosis not present

## 2021-07-29 NOTE — Telephone Encounter (Signed)
Patient dropped off surgical clearance form but needs to be sheduled for a follow up appt, overdue for cpe hasnt been seen since September of 2022. Will hold her form at the front desk until her appointment. Tried to call her to schedule and no voicemail was set up.

## 2021-07-31 ENCOUNTER — Telehealth: Payer: Self-pay

## 2021-07-31 ENCOUNTER — Encounter: Payer: Self-pay | Admitting: Physician Assistant

## 2021-07-31 ENCOUNTER — Ambulatory Visit: Payer: 59 | Admitting: Physician Assistant

## 2021-07-31 DIAGNOSIS — E782 Mixed hyperlipidemia: Secondary | ICD-10-CM

## 2021-07-31 DIAGNOSIS — E039 Hypothyroidism, unspecified: Secondary | ICD-10-CM | POA: Diagnosis not present

## 2021-07-31 DIAGNOSIS — Z6841 Body Mass Index (BMI) 40.0 and over, adult: Secondary | ICD-10-CM | POA: Diagnosis not present

## 2021-07-31 DIAGNOSIS — R5383 Other fatigue: Secondary | ICD-10-CM

## 2021-07-31 DIAGNOSIS — Z01818 Encounter for other preprocedural examination: Secondary | ICD-10-CM

## 2021-07-31 DIAGNOSIS — I10 Essential (primary) hypertension: Secondary | ICD-10-CM

## 2021-07-31 DIAGNOSIS — G479 Sleep disorder, unspecified: Secondary | ICD-10-CM

## 2021-07-31 DIAGNOSIS — E119 Type 2 diabetes mellitus without complications: Secondary | ICD-10-CM

## 2021-07-31 DIAGNOSIS — G4710 Hypersomnia, unspecified: Secondary | ICD-10-CM

## 2021-07-31 DIAGNOSIS — E538 Deficiency of other specified B group vitamins: Secondary | ICD-10-CM

## 2021-07-31 LAB — POCT GLYCOSYLATED HEMOGLOBIN (HGB A1C): Hemoglobin A1C: 6.5 % — AB (ref 4.0–5.6)

## 2021-07-31 MED ORDER — LOSARTAN POTASSIUM-HCTZ 50-12.5 MG PO TABS: 1.0000 | ORAL_TABLET | Freq: Every day | ORAL | 3 refills | Status: DC

## 2021-07-31 MED ORDER — METOPROLOL TARTRATE 50 MG PO TABS: 50.0000 mg | ORAL_TABLET | Freq: Two times a day (BID) | ORAL | 3 refills | Status: DC

## 2021-07-31 MED ORDER — RYBELSUS 3 MG PO TABS: 3.0000 mg | ORAL_TABLET | Freq: Every day | ORAL | 2 refills | Status: DC

## 2021-07-31 NOTE — Progress Notes (Signed)
[facility] [place], [place] [address]  Internal MEDICINE  Office Visit Note  Patient Name: Destiny Singh  [id number]  [id number]  Date of Service: 07/31/2021  Chief Complaint  Patient presents with   Follow-up   Diabetes   Hypertension   Quality Metric Gaps    Eye Exam and Colonoscopy    HPI Pt is going to be having right posterior tibial tendon repair and is here for surgical clearance. Surgery planned for July. -Not following with hematology anymore -Hasnt had refills due to not being in office since sept therefore has not been taking her BP meds and these will be sent today. -Sometimes checks BP at work and has been elevated recently -Has been doing well with diet to help keep sugars better controlled. Doesn't usually check BG at home -Has been off rybelsus for awhile and would like to restart--will start back at '3mg'$  and titrate up as indicated -She is overdue for CPE and will also have updated labs prior  -Reports she never heard anything about scheduling sleep study though several attempts were documented. Will re-order this today as her insurance has also changed  Current Medication: Outpatient Encounter Medications as of 07/31/2021  Medication Sig   Ashwagandha 500 MG CAPS Take 500 mg by mouth daily as needed (energy).   cholecalciferol (VITAMIN D3) 25 MCG (1000 UNIT) tablet Take 1,000 Units by mouth daily.   cyanocobalamin (,VITAMIN B-12,) 1000 MCG/ML injection Inject 1,000 mcg into the muscle See admin instructions. Every 2 months   hydrochlorothiazide (HYDRODIURIL) 25 MG tablet Take 1 tablet (25 mg total) by mouth daily as needed (swelling).   ibuprofen (ADVIL) 800 MG tablet Take 1 tablet (800 mg total) by mouth every 8 (eight) hours as needed for mild pain.   Medium Chain Triglycerides (MCT OIL PO) Take by mouth.   Semaglutide (RYBELSUS) 3 MG TABS Take 3 mg by mouth daily.   [DISCONTINUED] losartan-hydrochlorothiazide (HYZAAR) 50-12.5 MG tablet  Take 1 tablet by mouth daily.   [DISCONTINUED] metoprolol tartrate (LOPRESSOR) 50 MG tablet Take 1 tablet (50 mg total) by mouth 2 (two) times daily.   [DISCONTINUED] Semaglutide (RYBELSUS) 7 MG TABS Take 7 mg by mouth daily.   losartan-hydrochlorothiazide (HYZAAR) 50-12.5 MG tablet Take 1 tablet by mouth daily.   metoprolol tartrate (LOPRESSOR) 50 MG tablet Take 1 tablet (50 mg total) by mouth 2 (two) times daily.   No facility-administered encounter medications on file as of 07/31/2021.    Surgical History: Past Surgical History:  Procedure Laterality Date   BREAST EXCISIONAL BIOPSY     in teens per pt   CHONDROPLASTY Right 04/01/2016   Procedure: CHONDROPLASTY -ABRASION OF FEMORAL TROCHLEA, [place];  Surgeon: Corky Mull, MD;  Location: [facility] ORS;  Service: Orthopedics;  Laterality: Right;   KNEE ARTHROSCOPY Left 06/18/2020   Procedure: LEFT KNEE ARTHROSCOPY WITH DEBRIDEMENT AND PARTIAL MEDIAL MENISCECTOMY;  Surgeon: Corky Mull, MD;  Location: [facility];  Service: Orthopedics;  Laterality: Left;   KNEE ARTHROSCOPY WITH MENISCAL REPAIR Right 04/01/2016   Procedure: KNEE ARTHROSCOPY WITH PARTIAL MEDIAL  MENISCAL REPAIR;  Surgeon: Corky Mull, MD;  Location: [facility] OR[facility];  Service: Orthopedics;  Laterality: Right;   TUBAL LIGATION     TUMOR EXCISION  2016   BENIGN-HEAD    Medical History: Past Medical History:  Diagnosis Date   Adopted    Arthritis    BOTH KNEES   Asthma    Benign tumor  Diabetes mellitus without complication (HCC)    Dyspnea    VERY RARE   Dysrhythmia    IRREGULAR PER PT   Headache    Hypertension    Myocardial infarction ([place])    AGE 51    Family History: Family History  Adopted: Yes    Social History   Socioeconomic History   Marital status: Single    Spouse name: Not on file   Number of children: Not on file   Years of education: Not on file   Highest education level: Not on file  Occupational History   Not on file  Tobacco  Use   Smoking status: Never   Smokeless tobacco: Never  Substance and Sexual Activity   Alcohol use: No   Drug use: No   Sexual activity: Not on file  Other Topics Concern   Not on file  Social History Narrative   Not on file   Social Determinants of Health   Financial Resource Strain: Not on file  Food Insecurity: Not on file  Transportation Needs: Not on file  Physical Activity: Not on file  Stress: Not on file  Social Connections: Not on file  Intimate Partner Violence: Not on file      Review of Systems  Constitutional:  Positive for fatigue. Negative for chills and unexpected weight change.  HENT:  Negative for congestion, postnasal drip, rhinorrhea, sneezing and sore throat.   Eyes:  Negative for redness.  Respiratory:  Negative for cough, chest tightness and shortness of breath.   Cardiovascular:  Negative for chest pain and palpitations.  Gastrointestinal:  Negative for abdominal pain, constipation, diarrhea, nausea and vomiting.  Genitourinary:  Negative for dysuria and frequency.  Musculoskeletal:  Positive for arthralgias and gait problem. Negative for back pain, joint swelling and neck pain.  Skin:  Negative for rash.  Neurological:  Negative for tremors and numbness.  Hematological:  Negative for adenopathy. Does not bruise/bleed easily.  Psychiatric/Behavioral:  Positive for sleep disturbance. Negative for behavioral problems (Depression) and suicidal ideas. The patient is not nervous/anxious.    Vital Signs: BP (!) 151/87   Pulse 85   Temp 98 F (36.7 C)   Resp 16   Ht 5' 8.5" (1.74 m)   Wt 282 lb (127.9 kg)   LMP 11/23/2018 (Approximate)   SpO2 96%   BMI 42.25 kg/m    Physical Exam Vitals and nursing note reviewed.  Constitutional:      Appearance: Normal appearance.  HENT:     Head: Normocephalic and atraumatic.     Nose: Nose normal.     Mouth/Throat:     Mouth: Mucous membranes are moist.     Pharynx: No posterior oropharyngeal  erythema.  Eyes:     Extraocular Movements: Extraocular movements intact.     Pupils: Pupils are equal, round, and reactive to light.  Cardiovascular:     Rate and Rhythm: Normal rate and regular rhythm.     Pulses: Normal pulses.     Heart sounds: Normal heart sounds.  Pulmonary:     Effort: Pulmonary effort is normal.     Breath sounds: Normal breath sounds.  Skin:    General: Skin is warm and dry.  Neurological:     General: No focal deficit present.     Mental Status: She is alert.     Gait: Gait abnormal.  Psychiatric:        Mood and Affect: Mood normal.        Behavior:  Behavior normal.       Assessment/Plan: 1. Type 2 diabetes mellitus without complication, without long-term current use of insulin (HCC) - POCT HgB A1C is 6.5 which is improved from 6.8 at last check despite not being on any medications.  Continue to work on diet and exercise and will start back on 3 mg of Rybelsus daily  2. Essential hypertension Elevated in office but patient has been without medication for several months now.  We will start back on her medications and have follow-up in 3 weeks to reevaluate as this needs to be under better control prior to surgery - losartan-hydrochlorothiazide (HYZAAR) 50-12.5 MG tablet; Take 1 tablet by mouth daily.  Dispense: 90 tablet; Refill: 3 - metoprolol tartrate (LOPRESSOR) 50 MG tablet; Take 1 tablet (50 mg total) by mouth 2 (two) times daily.  Dispense: 60 tablet; Refill: 3  3. Pre-operative clearance Patient here for preoperative complaints for right posterior tibial tendon repair with flexor tendon transfer.  Preop clearance will be completed however patient was advised that she needs to have her blood pressure under better control and therefore will have follow-up in a few weeks to confirm this.  She is also due for CPE with routine labs and we will go ahead and order these labs to be done prior to surgery as well.    4. B12 deficiency - B12 and Folate  Panel  5. Sleep disturbances We will reorder sleep study due to patient having excessive daytime fatigue, snoring, and choking at night with an elevated BMI and elevated blood pressure - [facility] SLEEP STUDY; Future  6. Hypersomnia We will reorder sleep study due to patient having excessive daytime fatigue, snoring, and choking at night with an elevated BMI and elevated blood pressure - [facility] SLEEP STUDY; Future  7. Hypothyroidism, unspecified type - TSH + free T4  8. Mixed hyperlipidemia - Lipid Panel With LDL/HDL Ratio  9. Other fatigue - CBC w/Diff/Platelet - Comprehensive metabolic panel - TSH + free T4 - Lipid Panel With LDL/HDL Ratio - B12 and Folate Panel  10. Morbid obesity with BMI of 40.0-44.9, adult ([place]) We will work on diet and exercise and will start back on Rybelsus 3 mg to help with blood sugar control as well as weight loss benefits.  She also benefit from having a sleep study to evaluate for OSA    General Counseling: Cyani verbalizes understanding of the findings of todays visit and agrees with plan of treatment. I have discussed any further diagnostic evaluation that may be needed or ordered today. We also reviewed her medications today. she has been encouraged to call the office with any questions or concerns that should arise related to todays visit.    Orders Placed This Encounter  Procedures   CBC w/Diff/Platelet   Comprehensive metabolic panel   TSH + free T4   Lipid Panel With LDL/HDL Ratio   B12 and Folate Panel   POCT HgB A1C   PSG SLEEP STUDY    Meds ordered this encounter  Medications   losartan-hydrochlorothiazide (HYZAAR) 50-12.5 MG tablet    Sig: Take 1 tablet by mouth daily.    Dispense:  90 tablet    Refill:  3   metoprolol tartrate (LOPRESSOR) 50 MG tablet    Sig: Take 1 tablet (50 mg total) by mouth 2 (two) times daily.    Dispense:  60 tablet    Refill:  3   Semaglutide (RYBELSUS) 3 MG TABS    Sig: Take 3 mg  by mouth daily.     Dispense:  30 tablet    Refill:  2    This patient was seen by Drema Dallas, PA-C in collaboration with Dr. Clayborn Bigness as a part of collaborative care agreement.   Total time spent:35 Minutes Time spent includes review of chart, medications, test results, and follow up plan with the patient.      Dr Lavera Guise Internal medicine

## 2021-07-31 NOTE — Telephone Encounter (Signed)
SS order placed in [facility] folder-Toni 

## 2021-07-31 NOTE — Telephone Encounter (Signed)
Faxed patient's surgical clearance form to [facility] at [phone] and placed order into scan folder 07/31/2021.

## 2021-08-04 ENCOUNTER — Encounter: Payer: Self-pay | Admitting: Physician Assistant

## 2021-08-07 ENCOUNTER — Other Ambulatory Visit: Payer: Self-pay

## 2021-08-07 DIAGNOSIS — I10 Essential (primary) hypertension: Secondary | ICD-10-CM

## 2021-08-07 MED ORDER — RYBELSUS 3 MG PO TABS: 3.0000 mg | ORAL_TABLET | Freq: Every day | ORAL | 2 refills | Status: DC

## 2021-08-07 MED ORDER — LOSARTAN POTASSIUM-HCTZ 50-12.5 MG PO TABS: 1.0000 | ORAL_TABLET | Freq: Every day | ORAL | 3 refills | Status: DC

## 2021-08-07 MED ORDER — METOPROLOL TARTRATE 50 MG PO TABS: 50.0000 mg | ORAL_TABLET | Freq: Two times a day (BID) | ORAL | 3 refills | Status: DC

## 2021-08-07 NOTE — Telephone Encounter (Signed)
Pt called that she longer using [facility] send [facility] pres [facility] [place]

## 2021-08-17 ENCOUNTER — Telehealth: Payer: Self-pay

## 2021-08-17 NOTE — Telephone Encounter (Signed)
Attempted to contact patient to move 08/21/21 appointment to later in July for surgical clearance. No answer. Vm not set up. Sent patient mychart message to call office-Toni

## 2021-08-18 ENCOUNTER — Telehealth: Payer: Self-pay

## 2021-08-18 NOTE — Telephone Encounter (Signed)
[facility] lvm 08/13/21 and sent text to schedule SS appointment. Patient has not replied-Toni

## 2021-08-21 ENCOUNTER — Ambulatory Visit: Payer: 59 | Admitting: Physician Assistant

## 2021-08-28 ENCOUNTER — Other Ambulatory Visit: Payer: 59 | Admitting: Physician Assistant

## 2021-09-09 NOTE — Telephone Encounter (Signed)
Spoke with Destiny Singh w/ [facility] this morning. She stated they have left 2 vms for patient. Patient has not returned their calls. I lvm and sent mychart message to patient to call me back-Toni

## 2021-09-14 ENCOUNTER — Telehealth: Payer: Self-pay

## 2021-09-14 NOTE — Telephone Encounter (Signed)
Surgical clearance form placed on [place] desk.

## 2021-09-16 DIAGNOSIS — E782 Mixed hyperlipidemia: Secondary | ICD-10-CM | POA: Diagnosis not present

## 2021-09-16 DIAGNOSIS — E538 Deficiency of other specified B group vitamins: Secondary | ICD-10-CM | POA: Diagnosis not present

## 2021-09-16 DIAGNOSIS — R5383 Other fatigue: Secondary | ICD-10-CM | POA: Diagnosis not present

## 2021-09-16 DIAGNOSIS — E611 Iron deficiency: Secondary | ICD-10-CM | POA: Diagnosis not present

## 2021-09-16 DIAGNOSIS — R790 Abnormal level of blood mineral: Secondary | ICD-10-CM | POA: Diagnosis not present

## 2021-09-16 DIAGNOSIS — E039 Hypothyroidism, unspecified: Secondary | ICD-10-CM | POA: Diagnosis not present

## 2021-09-17 ENCOUNTER — Encounter: Payer: Self-pay | Admitting: Podiatry

## 2021-09-17 LAB — COMPREHENSIVE METABOLIC PANEL
ALT: 16 IU/L (ref 0–32)
AST: 16 IU/L (ref 0–40)
Albumin/Globulin Ratio: 1.5 (ref 1.2–2.2)
Albumin: 4.1 g/dL (ref 3.8–4.9)
Alkaline Phosphatase: 94 IU/L (ref 44–121)
BUN/Creatinine Ratio: 15 (ref 9–23)
BUN: 14 mg/dL (ref 6–24)
Bilirubin Total: 0.3 mg/dL (ref 0.0–1.2)
CO2: 23 mmol/L (ref 20–29)
Calcium: 9.5 mg/dL (ref 8.7–10.2)
Chloride: 103 mmol/L (ref 96–106)
Creatinine, Ser: 0.94 mg/dL (ref 0.57–1.00)
Globulin, Total: 2.7 g/dL (ref 1.5–4.5)
Glucose: 91 mg/dL (ref 70–99)
Potassium: 4.4 mmol/L (ref 3.5–5.2)
Sodium: 140 mmol/L (ref 134–144)
Total Protein: 6.8 g/dL (ref 6.0–8.5)
eGFR: 73 mL/min/{1.73_m2} (ref >59)

## 2021-09-17 LAB — CBC WITH DIFFERENTIAL/PLATELET
Basophils Absolute: 0.1 10*3/uL (ref 0.0–0.2)
Basos: 1 %
EOS (ABSOLUTE): 0.2 10*3/uL (ref 0.0–0.4)
Eos: 3 %
Hematocrit: 41.2 % (ref 34.0–46.6)
Hemoglobin: 12.8 g/dL (ref 11.1–15.9)
Immature Grans (Abs): 0 10*3/uL (ref 0.0–0.1)
Immature Granulocytes: 0 %
Lymphocytes Absolute: 3.3 10*3/uL — ABNORMAL HIGH (ref 0.7–3.1)
Lymphs: 42 %
MCH: 24.1 pg — ABNORMAL LOW (ref 26.6–33.0)
MCHC: 31.1 g/dL — ABNORMAL LOW (ref 31.5–35.7)
MCV: 78 fL — ABNORMAL LOW (ref 79–97)
Monocytes Absolute: 0.6 10*3/uL (ref 0.1–0.9)
Monocytes: 7 %
Neutrophils Absolute: 3.8 10*3/uL (ref 1.4–7.0)
Neutrophils: 47 %
Platelets: 370 10*3/uL (ref 150–450)
RBC: 5.31 x10E6/uL — ABNORMAL HIGH (ref 3.77–5.28)
RDW: 16.9 % — ABNORMAL HIGH (ref 11.7–15.4)
WBC: 7.9 10*3/uL (ref 3.4–10.8)

## 2021-09-17 LAB — TSH+FREE T4
Free T4: 1.15 ng/dL (ref 0.82–1.77)
TSH: 1.06 u[IU]/mL (ref 0.450–4.500)

## 2021-09-17 LAB — B12 AND FOLATE PANEL
Folate: 10.7 ng/mL (ref >3.0)
Vitamin B-12: 524 pg/mL (ref 232–1245)

## 2021-09-17 LAB — LIPID PANEL WITH LDL/HDL RATIO
Cholesterol, Total: 119 mg/dL (ref 100–199)
HDL: 56 mg/dL (ref >39)
LDL Chol Calc (NIH): 45 mg/dL (ref 0–99)
LDL/HDL Ratio: 0.8 ratio (ref 0.0–3.2)
Triglycerides: 95 mg/dL (ref 0–149)
VLDL Cholesterol Cal: 18 mg/dL (ref 5–40)

## 2021-09-21 ENCOUNTER — Encounter: Payer: Self-pay | Admitting: Physician Assistant

## 2021-09-21 ENCOUNTER — Ambulatory Visit (INDEPENDENT_AMBULATORY_CARE_PROVIDER_SITE_OTHER): Payer: 59 | Admitting: Physician Assistant

## 2021-09-21 VITALS — BP 142/84 | HR 80 | Temp 98.4°F | Resp 16 | Ht 68.5 in | Wt 274.6 lb

## 2021-09-21 DIAGNOSIS — M216X1 Other acquired deformities of right foot: Secondary | ICD-10-CM | POA: Diagnosis not present

## 2021-09-21 DIAGNOSIS — E119 Type 2 diabetes mellitus without complications: Secondary | ICD-10-CM

## 2021-09-21 DIAGNOSIS — I10 Essential (primary) hypertension: Secondary | ICD-10-CM

## 2021-09-21 DIAGNOSIS — R300 Dysuria: Secondary | ICD-10-CM

## 2021-09-21 DIAGNOSIS — R718 Other abnormality of red blood cells: Secondary | ICD-10-CM | POA: Diagnosis not present

## 2021-09-21 DIAGNOSIS — L819 Disorder of pigmentation, unspecified: Secondary | ICD-10-CM | POA: Diagnosis not present

## 2021-09-21 DIAGNOSIS — Z01818 Encounter for other preprocedural examination: Secondary | ICD-10-CM | POA: Diagnosis not present

## 2021-09-21 DIAGNOSIS — M2011 Hallux valgus (acquired), right foot: Secondary | ICD-10-CM | POA: Diagnosis not present

## 2021-09-21 DIAGNOSIS — Z124 Encounter for screening for malignant neoplasm of cervix: Secondary | ICD-10-CM

## 2021-09-21 DIAGNOSIS — M2142 Flat foot [pes planus] (acquired), left foot: Secondary | ICD-10-CM | POA: Diagnosis not present

## 2021-09-21 DIAGNOSIS — G4710 Hypersomnia, unspecified: Secondary | ICD-10-CM

## 2021-09-21 DIAGNOSIS — M2141 Flat foot [pes planus] (acquired), right foot: Secondary | ICD-10-CM | POA: Diagnosis not present

## 2021-09-21 DIAGNOSIS — R69 Illness, unspecified: Secondary | ICD-10-CM | POA: Diagnosis not present

## 2021-09-21 DIAGNOSIS — M249 Joint derangement, unspecified: Secondary | ICD-10-CM | POA: Diagnosis not present

## 2021-09-21 DIAGNOSIS — Z0001 Encounter for general adult medical examination with abnormal findings: Secondary | ICD-10-CM

## 2021-09-21 DIAGNOSIS — Z113 Encounter for screening for infections with a predominantly sexual mode of transmission: Secondary | ICD-10-CM

## 2021-09-21 DIAGNOSIS — M216X2 Other acquired deformities of left foot: Secondary | ICD-10-CM | POA: Diagnosis not present

## 2021-09-21 DIAGNOSIS — M79671 Pain in right foot: Secondary | ICD-10-CM | POA: Diagnosis not present

## 2021-09-21 DIAGNOSIS — M76821 Posterior tibial tendinitis, right leg: Secondary | ICD-10-CM | POA: Diagnosis not present

## 2021-09-21 NOTE — Progress Notes (Signed)
[facility] [place], [place] [address]  Internal MEDICINE  Office Visit Note  Patient Name: Destiny Singh  [id number]  [id number]  Date of Service: 09/25/2021  Chief Complaint  Patient presents with   Annual Exam   Hypertension   Diabetes   Referral    Dermatology referral wanted   Medication Management    Stopped losartan for upcoming procedure   Quality Metric Gaps    Colonscopy     HPI Pt is here for routine health maintenance examination -Down 8lbs since last visit, was taking rybelsus $RemoveBeforeD'3mg'ddOJAoHutqlUba$ . Has not  been taking the past month, but may restart soon. -Surgery on the 27th. She reports that their office called on Thursday and was told to stop one of her bp pills for a week in advance and she is not sure which so she stopped everything. She has appt with Dr. Luana Shu today and is going to clarify. Discussed if her BP is uncontrolled she may not be able to have procedure therefore needs to clarify if they really want this held. -called sleep center back with new phone number and hasnt heard back to schedule. Will reach out to schedule sleep study now. -Reviewed labs, elevated RBC and low mvc likely due to low iron-does state she was told she has an abnormality to her blood and was seen by hematology in past and told nothing to worry about. 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 Will see if an iron panel can be added to labs. Did discuss elevated RBC could be due to untreated OSA. Other labs look good. -Due for pap today, also will need to schedule mammogram and look into colonoscopy as well -requests dermatology referral due to darkening of skin under eyes and on cheeks as well as other areas.  Current Medication: Outpatient Encounter Medications as of 09/21/2021  Medication Sig   Ashwagandha 500 MG CAPS Take 500 mg by mouth daily as needed (energy).   cholecalciferol (VITAMIN D3) 25 MCG (1000 UNIT) tablet Take 1,000 Units by mouth  daily.   cyanocobalamin (,VITAMIN B-12,) 1000 MCG/ML injection Inject 1,000 mcg into the muscle See admin instructions. Every 2 months   ibuprofen (ADVIL) 800 MG tablet Take 1 tablet (800 mg total) by mouth every 8 (eight) hours as needed for mild pain.   losartan-hydrochlorothiazide (HYZAAR) 50-12.5 MG tablet Take 1 tablet by mouth daily.   metoprolol tartrate (LOPRESSOR) 50 MG tablet Take 1 tablet (50 mg total) by mouth 2 (two) times daily.   Semaglutide (RYBELSUS) 3 MG TABS Take 3 mg by mouth daily.   [DISCONTINUED] hydrochlorothiazide (HYDRODIURIL) 25 MG tablet Take 1 tablet (25 mg total) by mouth daily as needed (swelling).   No facility-administered encounter medications on file as of 09/21/2021.    Surgical History: Past Surgical History:  Procedure Laterality Date   BREAST EXCISIONAL BIOPSY     in teens per pt   CHONDROPLASTY Right 04/01/2016   Procedure: CHONDROPLASTY -ABRASION OF FEMORAL TROCHLEA, [place];  Surgeon: Corky Mull, MD;  Location: [facility] ORS;  Service: Orthopedics;  Laterality: Right;   KNEE ARTHROSCOPY Left 06/18/2020   Procedure: LEFT KNEE ARTHROSCOPY WITH DEBRIDEMENT AND PARTIAL MEDIAL MENISCECTOMY;  Surgeon: Corky Mull, MD;  Location: [facility] OR[facility];  Service: Orthopedics;  Laterality: Left;   KNEE ARTHROSCOPY WITH MENISCAL REPAIR Right 04/01/2016   Procedure: KNEE ARTHROSCOPY WITH PARTIAL MEDIAL  MENISCAL REPAIR;  Surgeon: Corky Mull, MD;  Location: [facility] OR[facility];  Service: Orthopedics;  Laterality: Right;   TUBAL LIGATION     TUMOR EXCISION  2016   BENIGN-HEAD    Medical History: Past Medical History:  Diagnosis Date   Adopted    Arthritis    BOTH KNEES   Asthma    Benign tumor    Diabetes mellitus without complication (HCC)    Dyspnea    VERY RARE   Dysrhythmia    IRREGULAR PER PT   Headache    Hypertension    Myocardial infarction ([place])    AGE 50    Family History: Family History  Adopted: Yes      Review of Systems   Constitutional:  Positive for fatigue. Negative for chills and unexpected weight change.  HENT:  Negative for congestion, postnasal drip, rhinorrhea, sneezing and sore throat.   Eyes:  Negative for redness.  Respiratory:  Negative for cough, chest tightness and shortness of breath.   Cardiovascular:  Negative for chest pain and palpitations.  Gastrointestinal:  Negative for abdominal pain, constipation, diarrhea, nausea and vomiting.  Genitourinary:  Negative for dysuria and frequency.  Musculoskeletal:  Positive for arthralgias and gait problem. Negative for back pain, joint swelling and neck pain.  Skin:  Negative for rash.  Neurological:  Negative for tremors and numbness.  Hematological:  Negative for adenopathy. Does not bruise/bleed easily.  Psychiatric/Behavioral:  Positive for sleep disturbance. Negative for behavioral problems (Depression) and suicidal ideas. The patient is not nervous/anxious.      Vital Signs: BP (!) 142/84   Pulse 80   Temp 98.4 F (36.9 C)   Resp 16   Ht 5' 8.5" (1.74 m)   Wt 274 lb 9.6 oz (124.6 kg)   LMP 11/23/2018 (Approximate)   SpO2 96%   BMI 41.15 kg/m    Physical Exam Vitals and nursing note reviewed.  Constitutional:      Appearance: Normal appearance.  HENT:     Head: Normocephalic and atraumatic.     Nose: Nose normal.     Mouth/Throat:     Mouth: Mucous membranes are moist.     Pharynx: No posterior oropharyngeal erythema.  Eyes:     Extraocular Movements: Extraocular movements intact.     Pupils: Pupils are equal, round, and reactive to light.  Cardiovascular:     Rate and Rhythm: Normal rate and regular rhythm.     Pulses: Normal pulses.     Heart sounds: Normal heart sounds.  Pulmonary:     Effort: Pulmonary effort is normal.     Breath sounds: Normal breath sounds.  Abdominal:     Tenderness: There is no abdominal tenderness.  Skin:    General: Skin is warm and dry.  Neurological:     General: No focal deficit  present.     Mental Status: She is alert.     Gait: Gait abnormal.  Psychiatric:        Mood and Affect: Mood normal.        Behavior: Behavior normal.      LABS: Recent Results (from the past 2160 hour(s))  POCT HgB A1C     Status: Abnormal   Collection Time: 07/31/21  9:33 AM  Result Value Ref Range   Hemoglobin A1C 6.5 (A) 4.0 - 5.6 %   HbA1c POC (<> result, manual entry)     HbA1c, POC (prediabetic range)     HbA1c, POC (controlled diabetic range)    CBC w/Diff/Platelet     Status: Abnormal   Collection  Time: 09/16/21  9:23 AM  Result Value Ref Range   WBC 7.9 3.4 - 10.8 x10E3/uL   RBC 5.31 (H) 3.77 - 5.28 x10E6/uL   Hemoglobin 12.8 11.1 - 15.9 g/dL   Hematocrit 41.2 34.0 - 46.6 %   MCV 78 (L) 79 - 97 fL   MCH 24.1 (L) 26.6 - 33.0 pg   MCHC 31.1 (L) 31.5 - 35.7 g/dL   RDW 16.9 (H) 11.7 - 15.4 %   Platelets 370 150 - 450 x10E3/uL   Neutrophils 47 Not Estab. %   Lymphs 42 Not Estab. %   Monocytes 7 Not Estab. %   Eos 3 Not Estab. %   Basos 1 Not Estab. %   Neutrophils Absolute 3.8 1.4 - 7.0 x10E3/uL   Lymphocytes Absolute 3.3 (H) 0.7 - 3.1 x10E3/uL   Monocytes Absolute 0.6 0.1 - 0.9 x10E3/uL   EOS (ABSOLUTE) 0.2 0.0 - 0.4 x10E3/uL   Basophils Absolute 0.1 0.0 - 0.2 x10E3/uL   Immature Granulocytes 0 Not Estab. %   Immature Grans (Abs) 0.0 0.0 - 0.1 x10E3/uL  Comprehensive metabolic panel     Status: None   Collection Time: 09/16/21  9:23 AM  Result Value Ref Range   Glucose 91 70 - 99 mg/dL   BUN 14 6 - 24 mg/dL   Creatinine, Ser 0.94 0.57 - 1.00 mg/dL   eGFR 73 >59 mL/min/1.73   BUN/Creatinine Ratio 15 9 - 23   Sodium 140 134 - 144 mmol/L   Potassium 4.4 3.5 - 5.2 mmol/L   Chloride 103 96 - 106 mmol/L   CO2 23 20 - 29 mmol/L   Calcium 9.5 8.7 - 10.2 mg/dL   Total Protein 6.8 6.0 - 8.5 g/dL   Albumin 4.1 3.8 - 4.9 g/dL    Comment:               **Please note reference interval change**   Globulin, Total 2.7 1.5 - 4.5 g/dL   Albumin/Globulin Ratio 1.5 1.2  - 2.2   Bilirubin Total 0.3 0.0 - 1.2 mg/dL   Alkaline Phosphatase 94 44 - 121 IU/L   AST 16 0 - 40 IU/L   ALT 16 0 - 32 IU/L  TSH + free T4     Status: None   Collection Time: 09/16/21  9:23 AM  Result Value Ref Range   TSH 1.060 0.450 - 4.500 uIU/mL   Free T4 1.15 0.82 - 1.77 ng/dL  Lipid Panel With LDL/HDL Ratio     Status: None   Collection Time: 09/16/21  9:23 AM  Result Value Ref Range   Cholesterol, Total 119 100 - 199 mg/dL   Triglycerides 95 0 - 149 mg/dL   HDL 56 >39 mg/dL   VLDL Cholesterol Cal 18 5 - 40 mg/dL   LDL Chol Calc (NIH) 45 0 - 99 mg/dL   LDL/HDL Ratio 0.8 0.0 - 3.2 ratio    Comment:                                     LDL/HDL Ratio                                             Men  Women  1/2 Avg.Risk  1.0    1.5                                   Avg.Risk  3.6    3.2                                2X Avg.Risk  6.2    5.0                                3X Avg.Risk  8.0    6.1   B12 and Folate Panel     Status: None   Collection Time: 09/16/21  9:23 AM  Result Value Ref Range   Vitamin B-12 524 232 - 1,245 pg/mL   Folate 10.7 >3.0 ng/mL    Comment: A serum folate concentration of less than 3.1 ng/mL is considered to represent clinical deficiency.   Iron and TIBC     Status: Abnormal   Collection Time: 09/16/21  9:23 AM  Result Value Ref Range   Total Iron Binding Capacity 327 250 - 450 ug/dL   UIBC 296 131 - 425 ug/dL   Iron 31 27 - 159 ug/dL   Iron Saturation 9 (LL) 15 - 55 %  Ferritin     Status: None   Collection Time: 09/16/21  9:23 AM  Result Value Ref Range   Ferritin 50 15 - 150 ng/mL  Specimen status report     Status: None   Collection Time: 09/16/21  9:23 AM  Result Value Ref Range   specimen status report Comment     Comment: Written Authorization Written Authorization Written Authorization Received. Authorization received from [place] 09-23-2021 Logged by Gareth Morgan   IGP, Aptima HPV     Status:  None   Collection Time: 09/21/21 11:46 AM  Result Value Ref Range   Interpretation NILM     Comment: NEGATIVE FOR INTRAEPITHELIAL LESION OR MALIGNANCY.   Category NIL     Comment: Negative for Intraepithelial Lesion   Adequacy SECNI     Comment: Satisfactory for evaluation. No endocervical component is identified.   Clinician Provided ICD10 Comment     Comment: Z12.4   Performed by: Comment     Comment: Charity Atilano Median, Cytotechnologist (ASCP)   Note: Comment     Comment: The Pap smear is a screening test designed to aid in the detection of premalignant and malignant conditions of the uterine cervix.  It is not a diagnostic procedure and should not be used as the sole means of detecting cervical cancer.  Both false-positive and false-negative reports do occur.    Test Methodology Comment     Comment: This liquid based ThinPrep(R) pap test was screened with the use of an image guided system.    HPV Aptima Negative Negative    Comment: This nucleic acid amplification test detects fourteen high-risk HPV types (16,18,31,33,35,39,45,51,52,56,58,59,66,68) without differentiation.   NuSwab Vaginitis Plus (VG+)     Status: Abnormal   Collection Time: 09/21/21  4:07 PM  Result Value Ref Range   Atopobium vaginae High - 2 (A) Score   BVAB 2 Low - 0 Score   Megasphaera 1 Low - 0 Score    Comment: Calculate total score by adding the 3 individual bacterial vaginosis (BV) marker scores together.  Total score is interpreted as follows: Total score 0-1: Indicates the absence of BV. Total score   2: Indeterminate for BV. Additional clinical                  data should be evaluated to establish a                  diagnosis. Total score 3-6: Indicates the presence of BV. This test was developed and its performance characteristics determined by [facility].  It has not been cleared or approved by the Food and Drug Administration.    Candida albicans, NAA Negative Negative   Candida glabrata,  NAA Negative Negative   Trich vag by NAA Negative Negative   Chlamydia trachomatis, NAA Negative Negative   Neisseria gonorrhoeae, NAA Negative Negative        Assessment/Plan: 1. Encounter for general adult medical examination with abnormal findings CPE performed, routine labs reviewed, Pap updated. Will need to discuss scheduling colonoscopy and mammogram  2. Essential hypertension Elevated in office, but per pt was told to hold bp meds for surgery and is going to clarify with surgeon's office.   3. Type 2 diabetes mellitus without complication, without long-term current use of insulin (HCC) A1c last visit was 6.5 and not due to recheck yet. Has been on rybelsus $RemoveBef'3mg'kIVvTtTbfc$  though stopped and may restart. Continue to work on diet and exercise  4. Pre-operative clearance Pt here for preoperative clearance for right posterior tibial tendon repair with flexor tendon transfer. Preop clearance completed however pt was advised to have sleep study to evaluate for OSA and should supplement iron.  5. Hypersomnia Will move forward with scheduling PSG  6. Microcytosis Iron panel added and results do confirm very low iron and pt notified to start daily supplement  7. Hyperpigmentation of skin Pt requests dermatology referral - Ambulatory referral to Dermatology  8. Screen for STD (sexually transmitted disease) - NuSwab Vaginitis Plus (VG+)  9. Routine cervical smear - IGP, Aptima HPV  10. Dysuria - UA/M w/rflx Culture, Routine   General Counseling: Kyndle verbalizes understanding of the findings of todays visit and agrees with plan of treatment. I have discussed any further diagnostic evaluation that may be needed or ordered today. We also reviewed her medications today. she has been encouraged to call the office with any questions or concerns that should arise related to todays visit.    Counseling:    Orders Placed This Encounter  Procedures   UA/M w/rflx Culture, Routine    NuSwab Vaginitis Plus (VG+)   Ambulatory referral to Dermatology    No orders of the defined types were placed in this encounter.   This patient was seen by Drema Dallas, PA-C in collaboration with Dr. Clayborn Bigness as a part of collaborative care agreement.  Total time spent:35 Minutes  Time spent includes review of chart, medications, test results, and follow up plan with the patient.     Lavera Guise, MD  Internal Medicine

## 2021-09-23 ENCOUNTER — Telehealth: Payer: Self-pay

## 2021-09-23 LAB — IRON AND TIBC
Iron Saturation: 9 % — CL (ref 15–55)
Iron: 31 ug/dL (ref 27–159)
Total Iron Binding Capacity: 327 ug/dL (ref 250–450)
UIBC: 296 ug/dL (ref 131–425)

## 2021-09-23 LAB — NUSWAB VAGINITIS PLUS (VG+)
Atopobium vaginae: HIGH Score — AB
Candida albicans, NAA: NEGATIVE
Candida glabrata, NAA: NEGATIVE
Chlamydia trachomatis, NAA: NEGATIVE
Neisseria gonorrhoeae, NAA: NEGATIVE
Trich vag by NAA: NEGATIVE

## 2021-09-23 LAB — FERRITIN: Ferritin: 50 ng/mL (ref 15–150)

## 2021-09-23 LAB — SPECIMEN STATUS REPORT

## 2021-09-23 NOTE — Telephone Encounter (Signed)
-----   Message from Mylinda Latina, PA-C sent at 09/23/2021  8:18 AM EDT ----- Please let her know that her iron is very low and she needs to start supplementing daily. If she cant tolerate oral supplement then she should let office know.

## 2021-09-23 NOTE — Discharge Instructions (Addendum)
[place]  POST OPERATIVE INSTRUCTIONS FOR DR. Vickki Muff AND DR. Everly   Take your medication as prescribed.  Pain medication should be taken only as needed.  You may take Tylenol between pain medication doses as well.  Recommend avoiding NSAIDs as this could decrease bone healing ability.  Continue to recommend vitamin D and calcium supplementation over-the-counter.  If pain is still severe despite taking pain medication then take 1 pain tablet every 4 hours.  If still severe then try taking 2 pain tablets every 6 hours.  Keep the dressing clean, dry and intact.  Remain nonweightbearing to the right lower extremity at all times.  Keep your foot elevated above the heart level for the first 48 hours.  Continue elevation thereafter to improve swelling.  Recommend also applying ice to the right foot on the top of the foot and front of the ankle or behind the knee even for maximum 10 minutes out of every 1 hour as needed.  Walking to the bathroom and brief periods of walking are acceptable, unless we have instructed you to be non-weight bearing.  Always wear your post-op shoe when walking.  Always use your crutches if you are to be non-weight bearing.  Do not take a shower. Baths are permissible as long as the foot is kept out of the water.   Every hour you are awake:  Bend your knee 15 times. Massage calf 15 times  Call [facility] [phone]) if any of the following problems occur: You develop a temperature or fever. The bandage becomes saturated with blood. Medication does not stop your pain. Injury of the foot occurs. Any symptoms of infection including redness, odor, or red streaks running from wound.

## 2021-09-23 NOTE — Telephone Encounter (Signed)
[name] to call back regarding labs result

## 2021-09-23 NOTE — Progress Notes (Signed)
Please let her know that her iron is very low and she needs to start supplementing daily. If she cant tolerate oral supplement then she should let office know.

## 2021-09-23 NOTE — Telephone Encounter (Signed)
Pt advised as per lauren her iron is low advised her take OTC supplement daily

## 2021-09-25 ENCOUNTER — Telehealth: Payer: Self-pay

## 2021-09-25 LAB — IGP, APTIMA HPV: HPV Aptima: NEGATIVE

## 2021-09-25 NOTE — Telephone Encounter (Signed)
Clearance signed and faxed back to [facility] [phone]

## 2021-09-25 NOTE — Telephone Encounter (Signed)
-----   Message from Edd Arbour, [place] sent at 09/25/2021  3:42 PM EDT -----  ----- Message ----- From: Mylinda Latina, PA-C Sent: 09/25/2021  12:23 PM EDT To: Edd Arbour, CMA  Please let her know that her pap was normal but she does have low evidence of BV. If she is having discharge/odor or irritation then can start her on Flagyl, but her score was only 2/6 which is considered indeterminate for BV and therefore may not need treatment.

## 2021-09-25 NOTE — Telephone Encounter (Signed)
Spoke with patient regarding papsmear results on 09/25/2021.

## 2021-09-28 ENCOUNTER — Other Ambulatory Visit: Payer: Self-pay | Admitting: Podiatry

## 2021-09-30 ENCOUNTER — Telehealth: Payer: Self-pay

## 2021-09-30 NOTE — Telephone Encounter (Signed)
Patient scheduled for hst on 10/21/21 @ [facility]

## 2021-10-01 ENCOUNTER — Ambulatory Visit
Admission: RE | Admit: 2021-10-01 | Discharge: 2021-10-01 | Disposition: A | Discharge status: Home / Self Care | Payer: 59 | Attending: Podiatry | Admitting: Podiatry

## 2021-10-01 ENCOUNTER — Ambulatory Visit: Payer: 59 | Admitting: Anesthesiology

## 2021-10-01 ENCOUNTER — Other Ambulatory Visit: Payer: Self-pay

## 2021-10-01 ENCOUNTER — Encounter: Payer: Self-pay | Admitting: Podiatry

## 2021-10-01 ENCOUNTER — Encounter
Admission: RE | Disposition: A | Discharge status: Home / Self Care | Payer: Self-pay | Source: Home / Self Care | Attending: Podiatry

## 2021-10-01 DIAGNOSIS — I499 Cardiac arrhythmia, unspecified: Secondary | ICD-10-CM | POA: Insufficient documentation

## 2021-10-01 DIAGNOSIS — J45909 Unspecified asthma, uncomplicated: Secondary | ICD-10-CM | POA: Diagnosis not present

## 2021-10-01 DIAGNOSIS — M2141 Flat foot [pes planus] (acquired), right foot: Secondary | ICD-10-CM | POA: Diagnosis not present

## 2021-10-01 DIAGNOSIS — M2011 Hallux valgus (acquired), right foot: Secondary | ICD-10-CM | POA: Diagnosis not present

## 2021-10-01 DIAGNOSIS — M65871 Other synovitis and tenosynovitis, right ankle and foot: Secondary | ICD-10-CM | POA: Insufficient documentation

## 2021-10-01 DIAGNOSIS — I252 Old myocardial infarction: Secondary | ICD-10-CM | POA: Diagnosis not present

## 2021-10-01 DIAGNOSIS — Z7984 Long term (current) use of oral hypoglycemic drugs: Secondary | ICD-10-CM | POA: Diagnosis not present

## 2021-10-01 DIAGNOSIS — E119 Type 2 diabetes mellitus without complications: Secondary | ICD-10-CM | POA: Diagnosis not present

## 2021-10-01 DIAGNOSIS — M2140 Flat foot [pes planus] (acquired), unspecified foot: Secondary | ICD-10-CM | POA: Diagnosis not present

## 2021-10-01 DIAGNOSIS — I10 Essential (primary) hypertension: Secondary | ICD-10-CM | POA: Diagnosis not present

## 2021-10-01 DIAGNOSIS — M2041 Other hammer toe(s) (acquired), right foot: Secondary | ICD-10-CM | POA: Diagnosis not present

## 2021-10-01 DIAGNOSIS — R0602 Shortness of breath: Secondary | ICD-10-CM | POA: Diagnosis not present

## 2021-10-01 DIAGNOSIS — R519 Headache, unspecified: Secondary | ICD-10-CM | POA: Diagnosis not present

## 2021-10-01 DIAGNOSIS — M76821 Posterior tibial tendinitis, right leg: Secondary | ICD-10-CM | POA: Diagnosis not present

## 2021-10-01 DIAGNOSIS — M216X1 Other acquired deformities of right foot: Secondary | ICD-10-CM | POA: Diagnosis not present

## 2021-10-01 DIAGNOSIS — Z09 Encounter for follow-up examination after completed treatment for conditions other than malignant neoplasm: Secondary | ICD-10-CM

## 2021-10-01 HISTORY — PX: BUNIONECTOMY: SHX129

## 2021-10-01 HISTORY — PX: TENDON TRANSFER: SHX6109

## 2021-10-01 HISTORY — PX: CALCANEAL OSTEOTOMY: SHX1281

## 2021-10-01 HISTORY — PX: GASTROC RECESSION EXTREMITY: SHX6262

## 2021-10-01 HISTORY — PX: TENOTOMY / FLEXOR TENDON TRANSFER: SHX6629

## 2021-10-01 LAB — GLUCOSE, CAPILLARY
Glucose-Capillary: 111 mg/dL — ABNORMAL HIGH (ref 70–99)
Glucose-Capillary: 166 mg/dL — ABNORMAL HIGH (ref 70–99)

## 2021-10-01 SURGERY — BUNIONECTOMY
Anesthesia: Regional | Site: Toe | Laterality: Right

## 2021-10-01 MED ORDER — AMOXICILLIN-POT CLAVULANATE 500-125 MG PO TABS: 1.0000 | ORAL_TABLET | Freq: Three times a day (TID) | ORAL | 0 refills | Status: DC

## 2021-10-01 MED ORDER — DEXAMETHASONE SODIUM PHOSPHATE 4 MG/ML IJ SOLN
INTRAMUSCULAR | Status: DC | PRN
  Administered 2021-10-01: 4 mg via INTRAVENOUS

## 2021-10-01 MED ORDER — 0.9 % SODIUM CHLORIDE (POUR BTL) OPTIME
TOPICAL | Status: DC | PRN
  Administered 2021-10-01: 1000 mL

## 2021-10-01 MED ORDER — BUPIVACAINE LIPOSOME 1.3 % IJ SUSP
INTRAMUSCULAR | Status: DC | PRN
  Administered 2021-10-01: 266 mg

## 2021-10-01 MED ORDER — EPHEDRINE SULFATE (PRESSORS) 50 MG/ML IJ SOLN
INTRAMUSCULAR | Status: DC | PRN
  Administered 2021-10-01: 10 mg via INTRAVENOUS

## 2021-10-01 MED ORDER — FENTANYL CITRATE (PF) 100 MCG/2ML IJ SOLN
INTRAMUSCULAR | Status: DC | PRN
  Administered 2021-10-01: 25 ug via INTRAVENOUS
  Administered 2021-10-01: 50 ug via INTRAVENOUS
  Administered 2021-10-01 (×3): 25 ug via INTRAVENOUS
  Administered 2021-10-01: 100 ug via INTRAVENOUS
  Administered 2021-10-01 (×2): 25 ug via INTRAVENOUS

## 2021-10-01 MED ORDER — PROPOFOL 10 MG/ML IV BOLUS
INTRAVENOUS | Status: DC | PRN
  Administered 2021-10-01: 200 mg via INTRAVENOUS

## 2021-10-01 MED ORDER — CEFAZOLIN SODIUM-DEXTROSE 2-3 GM-%(50ML) IV SOLR
INTRAVENOUS | Status: DC | PRN
  Administered 2021-10-01: 2 g via INTRAVENOUS

## 2021-10-01 MED ORDER — CEFAZOLIN SODIUM-DEXTROSE 2-4 GM/100ML-% IV SOLN
2.0000 g | INTRAVENOUS | Status: AC
  Administered 2021-10-01: 2 g via INTRAVENOUS

## 2021-10-01 MED ORDER — ASPIRIN 81 MG PO TBEC: 81.0000 mg | DELAYED_RELEASE_TABLET | Freq: Two times a day (BID) | ORAL | 0 refills | Status: AC

## 2021-10-01 MED ORDER — OXYCODONE-ACETAMINOPHEN 7.5-325 MG PO TABS: 1.0000 | ORAL_TABLET | Freq: Four times a day (QID) | ORAL | 0 refills | Status: AC | PRN

## 2021-10-01 MED ORDER — MIDAZOLAM HCL 5 MG/5ML IJ SOLN
INTRAMUSCULAR | Status: DC | PRN
  Administered 2021-10-01: 2 mg via INTRAVENOUS

## 2021-10-01 MED ORDER — LACTATED RINGERS IV SOLN: INTRAVENOUS | Status: DC

## 2021-10-01 MED ORDER — BUPIVACAINE HCL (PF) 0.5 % IJ SOLN
INTRAMUSCULAR | Status: DC | PRN
  Administered 2021-10-01: 20 mL

## 2021-10-01 MED ORDER — ONDANSETRON HCL 4 MG PO TABS: 4.0000 mg | ORAL_TABLET | Freq: Three times a day (TID) | ORAL | 0 refills | Status: DC | PRN

## 2021-10-01 MED ORDER — GLYCOPYRROLATE 0.2 MG/ML IJ SOLN
INTRAMUSCULAR | Status: DC | PRN
  Administered 2021-10-01: .2 mg via INTRAVENOUS

## 2021-10-01 MED ORDER — LIDOCAINE HCL (CARDIAC) PF 100 MG/5ML IV SOSY
PREFILLED_SYRINGE | INTRAVENOUS | Status: DC | PRN
  Administered 2021-10-01: 60 mg via INTRATRACHEAL

## 2021-10-01 SURGICAL SUPPLY — 109 items
ANCH SUT NDL DX FBRTK STRL LF (Anchor) ×3 IMPLANT
ANCHOR SUT FBRTK 1.3 SUTTAP (Anchor) ×1 IMPLANT
APL SKNCLS STERI-STRIP NONHPOA (GAUZE/BANDAGES/DRESSINGS)
BANDAGE ELASTIC 4 LF NS (GAUZE/BANDAGES/DRESSINGS) ×4 IMPLANT
BENZOIN TINCTURE PRP APPL 2/3 (GAUZE/BANDAGES/DRESSINGS) IMPLANT
BIT DRILL 2 FENESTRATED (MISCELLANEOUS) IMPLANT
BIT DRILL CANNULTD 2.6 X 130MM (DRILL) IMPLANT
BIT DRILL SOLID 2.0 X 110MM (DRILL) IMPLANT
BIT DRILLL 2 FENESTRATED (MISCELLANEOUS) ×4
BLADE MINI RND TIP GREEN BEAV (BLADE) ×4 IMPLANT
BLADE OSC/SAGITTAL MD 5.5X18 (BLADE) ×1 IMPLANT
BLADE OSC/SAGITTAL MD 9X18.5 (BLADE) ×1 IMPLANT
BLADE OSCILLATING/SAGITTAL (BLADE) ×4
BLADE SURG 15 STRL LF DISP TIS (BLADE) IMPLANT
BLADE SURG 15 STRL SS (BLADE)
BLADE SW THK.38XMED LNG THN (BLADE) IMPLANT
BNDG CMPR 75X41 PLY HI ABS (GAUZE/BANDAGES/DRESSINGS)
BNDG CMPR MED 5X4 ELC HKLP NS (GAUZE/BANDAGES/DRESSINGS) ×3
BNDG CMPR STD VLCR NS LF 5.8X4 (GAUZE/BANDAGES/DRESSINGS) ×3
BNDG CMPR STD VLCR NS LF 5.8X6 (GAUZE/BANDAGES/DRESSINGS) ×3
BNDG COHESIVE 4X5 TAN ST LF (GAUZE/BANDAGES/DRESSINGS) ×4 IMPLANT
BNDG COHESIVE 4X5 TAN STRL (GAUZE/BANDAGES/DRESSINGS) ×4 IMPLANT
BNDG ELASTIC 4X5.8 VLCR NS LF (GAUZE/BANDAGES/DRESSINGS) ×4 IMPLANT
BNDG ELASTIC 6X5.8 VLCR NS LF (GAUZE/BANDAGES/DRESSINGS) ×4 IMPLANT
BNDG ESMARK 4X12 TAN STRL LF (GAUZE/BANDAGES/DRESSINGS) ×4 IMPLANT
BNDG GAUZE 4.5X4.1 6PLY STRL (MISCELLANEOUS) ×4 IMPLANT
BNDG GAUZE ELAST 4 BULKY (GAUZE/BANDAGES/DRESSINGS) ×4 IMPLANT
BNDG STRETCH 4X75 STRL LF (GAUZE/BANDAGES/DRESSINGS) IMPLANT
CANISTER SUCT 1200ML W/VALVE (MISCELLANEOUS) ×4 IMPLANT
COUNTERSICK 4.0 HEADED (MISCELLANEOUS) ×4
COVER LIGHT HANDLE UNIVERSAL (MISCELLANEOUS) ×8 IMPLANT
CUFF TOURN SGL QUICK 18X4 (TOURNIQUET CUFF) IMPLANT
CUFF TOURN SGL QUICK 24 (TOURNIQUET CUFF) ×4
CUFF TOURN SGL QUICK 30 (TOURNIQUET CUFF) ×4
CUFF TOURN SGL QUICK 34 (TOURNIQUET CUFF) ×4
CUFF TRNQT CYL 24X4X40X1 (TOURNIQUET CUFF) ×3 IMPLANT
CUFF TRNQT CYL 30X4X21-28X (TOURNIQUET CUFF) ×3 IMPLANT
CUFF TRNQT CYL 34X4X40X1 (TOURNIQUET CUFF) ×3 IMPLANT
DRAPE FLUOR MINI C-ARM 54X84 (DRAPES) ×4 IMPLANT
DRILL CANNULATED 2.6 X 130MM (DRILL) ×4
DRILL SOLID 2.0 X 110MM (DRILL) ×4
DRSG TEGADERM 4X4.75 (GAUZE/BANDAGES/DRESSINGS) ×1 IMPLANT
DURAPREP 26ML APPLICATOR (WOUND CARE) ×4 IMPLANT
ELECT REM PT RETURN 9FT ADLT (ELECTROSURGICAL) ×4
ELECTRODE REM PT RTRN 9FT ADLT (ELECTROSURGICAL) ×3 IMPLANT
GAUZE 4X4 16PLY ~~LOC~~+RFID DBL (SPONGE) ×2 IMPLANT
GAUZE SPONGE 4X4 12PLY STRL (GAUZE/BANDAGES/DRESSINGS) ×4 IMPLANT
GAUZE XEROFORM 1X8 LF (GAUZE/BANDAGES/DRESSINGS) ×4 IMPLANT
GLOVE BIO SURGEON STRL SZ7 (GLOVE) ×4 IMPLANT
GLOVE PI ULTRA LF STRL 7.5 (GLOVE) ×6 IMPLANT
GLOVE PI ULTRA NON LATEX 7.5 (GLOVE) ×2
GLOVE SURG POLYISO LF SZ7 (GLOVE) ×8 IMPLANT
GOWN STRL REUS W/ TWL LRG LVL3 (GOWN DISPOSABLE) ×6 IMPLANT
GOWN STRL REUS W/ TWL XL LVL3 (GOWN DISPOSABLE) ×3 IMPLANT
GOWN STRL REUS W/TWL LRG LVL3 (GOWN DISPOSABLE) ×8
GOWN STRL REUS W/TWL XL LVL3 (GOWN DISPOSABLE) ×4
GRAFT DMB PUTTY 1 OPTIUM FD (Bone Implant) IMPLANT
GRAFT WEDGE LAPIDUS 5 5D (Miscellaneous) ×1 IMPLANT
IV NS 250ML (IV SOLUTION)
IV NS 250ML BAXH (IV SOLUTION) IMPLANT
K-WIRE DBL END TROCAR 6X.045 (WIRE) ×4
K-WIRE DBL END TROCAR 6X.062 (WIRE)
K-WIRE SMOOTH TROCAR 2.0X150 (WIRE) ×8
K-WIRE SNGL END 1.2X150 (MISCELLANEOUS) ×16
KIT FIBERTAK DX 1.6 DISP (KITS) ×1 IMPLANT
KIT SIZER GRAFT TENODESIS SUTR (KITS) ×1 IMPLANT
KIT TURNOVER KIT A (KITS) ×4 IMPLANT
KWIRE DBL END TROCAR 6X.045 (WIRE) IMPLANT
KWIRE DBL END TROCAR 6X.062 (WIRE) IMPLANT
KWIRE SMOOTH TROCAR 2.0X150 (WIRE) IMPLANT
KWIRE SNGL END 1.2X150 (MISCELLANEOUS) IMPLANT
NS IRRIG 500ML POUR BTL (IV SOLUTION) ×4 IMPLANT
PACK EXTREMITY ARMC (MISCELLANEOUS) ×4 IMPLANT
PADDING CAST BLEND 4X4 NS (MISCELLANEOUS) ×12 IMPLANT
PLATE LAPIDUS 3H STD RT (Plate) ×1 IMPLANT
PLATE MED RT TI 20X1.1THK (Plate) ×1 IMPLANT
PUTTY DBM OPTIUM 1CC (Bone Implant) ×4 IMPLANT
RASP SM TEAR CROSS CUT (RASP) IMPLANT
SCREW CANN 4.0X40 SHORT THREAD (Screw) ×1 IMPLANT
SCREW COUNTERSINK 4.0 HEADED (MISCELLANEOUS) IMPLANT
SCREW LOCK PLATE R3 2.7X14 (Screw) ×1 IMPLANT
SCREW LOCK PLATE R3 2.7X16 (Screw) ×1 IMPLANT
SCREW LOCK PLATE R3 2.7X18 (Screw) ×3 IMPLANT
SCREW LOCK PLATE R3 2.7X20 (Screw) ×1 IMPLANT
SCREW NON LOCK 3.5X18 (Screw) ×1 IMPLANT
SCREW NON LOCK PLATE 2.7X16M (Screw) ×1 IMPLANT
SPLINT CAST 1 STEP 4X30 (MISCELLANEOUS) ×4 IMPLANT
STAPLER SKIN PROX 35W (STAPLE) ×1 IMPLANT
STOCKINETTE IMPERVIOUS LG (DRAPES) ×4 IMPLANT
STRIP CLOSURE SKIN 1/4X4 (GAUZE/BANDAGES/DRESSINGS) ×4 IMPLANT
SUT ETHILON 4-0 (SUTURE)
SUT ETHILON 4-0 FS2 18XMFL BLK (SUTURE)
SUT ETHILON 5-0 FS-2 18 BLK (SUTURE) ×4 IMPLANT
SUT MNCRL+ 5-0 UNDYED PC-3 (SUTURE) IMPLANT
SUT MONOCRYL 5-0 (SUTURE)
SUT VIC AB 0 CT1 27 (SUTURE)
SUT VIC AB 0 CT1 27XCR 8 STRN (SUTURE) IMPLANT
SUT VIC AB 2-0 SH 27 (SUTURE)
SUT VIC AB 2-0 SH 27XBRD (SUTURE) IMPLANT
SUT VIC AB 3-0 SH 27 (SUTURE) ×4
SUT VIC AB 3-0 SH 27X BRD (SUTURE) IMPLANT
SUT VIC AB 4-0 FS2 27 (SUTURE) ×4 IMPLANT
SUTURE ETHLN 4-0 FS2 18XMF BLK (SUTURE) IMPLANT
SYSTEM IMPLANT FDL 4.75 (Anchor) ×1 IMPLANT
TENODESIS GRAFT SIZING KIT WITH FIBERLOOP SUTURE T (Anchor) ×1 IMPLANT
WAND TENDON TOPAZ 0 ANGL (MISCELLANEOUS) IMPLANT
WAND TOPAZ MICRO DEBRIDER (MISCELLANEOUS) ×1 IMPLANT
WEDGE BONE 20MMH X 22MMD 8MMT (Tissue) ×1 IMPLANT
WIRE OLIVE SMOOTH 1.4MMX60MM (WIRE) ×2 IMPLANT

## 2021-10-01 NOTE — Anesthesia Postprocedure Evaluation (Signed)
Anesthesia Post Note  Patient: Destiny Singh  Procedure(s) Performed: BUNIONECTOMY - LAPIDUS - TYPE (Right: Toe) GASTROC RECESSION (Right: Foot) FDL TRANSFER; DEEP (Right: Leg Lower) FLEXOR TENDON REPAIR (Right: Foot) EVANS/MCDO (Right: Foot)     Patient location during evaluation: PACU Anesthesia Type: Regional and General Level of consciousness: awake and alert Pain management: pain level controlled Vital Signs Assessment: post-procedure vital signs reviewed and stable Respiratory status: spontaneous breathing, nonlabored ventilation, respiratory function stable and patient connected to nasal cannula oxygen Cardiovascular status: blood pressure returned to baseline and stable Postop Assessment: no apparent nausea or vomiting Anesthetic complications: no   No notable events documented.  Wanda Plump Danzell Birky

## 2021-10-01 NOTE — Transfer of Care (Signed)
Immediate Anesthesia Transfer of Care Note  Patient: Destiny Singh  Procedure(s) Performed: BUNIONECTOMY - LAPIDUS - TYPE (Right: Toe) GASTROC RECESSION (Right: Foot) FDL TRANSFER; DEEP (Right: Leg Lower) FLEXOR TENDON REPAIR (Right: Foot) EVANS/MCDO (Right: Foot)  Patient Location: PACU  Anesthesia Type: General, Regional  Level of Consciousness: awake, alert  and patient cooperative  Airway and Oxygen Therapy: Patient Spontanous Breathing and Patient connected to supplemental oxygen  Post-op Assessment: Post-op Vital signs reviewed, Patient's Cardiovascular Status Stable, Respiratory Function Stable, Patent Airway and No signs of Nausea or vomiting  Post-op Vital Signs: Reviewed and stable  Complications: No notable events documented.

## 2021-10-01 NOTE — Anesthesia Procedure Notes (Addendum)
Anesthesia Regional Block: Adductor canal block   Pre-Anesthetic Checklist: , timeout performed,  Correct Patient, Correct Site, Correct Laterality,  Correct Procedure, Correct Position, site marked,  Risks and benefits discussed,  Surgical consent,  Pre-op evaluation,  At surgeon's request and post-op pain management  Laterality: Right  Prep: chloraprep       Needles:  Injection technique: Single-shot  Needle Type: Echogenic Needle     Needle Length: 9cm  Needle Gauge: 21     Additional Needles:   Procedures:,,,, ultrasound used (permanent image in chart),,    Narrative:  Injection made incrementally with aspirations every 5 mL.  Performed by: Personally   Additional Notes: Functioning IV was confirmed and monitors applied. Ultrasound guidance: relevant anatomy identified, needle position confirmed, local anesthetic spread visualized around nerve(s)., vascular puncture avoided.  Image printed for medical record.  Negative aspiration and no paresthesias; incremental administration of local anesthetic. The patient tolerated the procedure well. Vitals signes recorded in RN notes.

## 2021-10-01 NOTE — H&P (Addendum)
HISTORY AND PHYSICAL INTERVAL NOTE:  10/01/2021  7:27 AM  Destiny Singh  has presented today for surgery, with the diagnosis of M20.11 - Hallux Valgus M21.4 - Pes planus M21.6X1 - Equinus M65.871 - Other synovitis and tenosynovitis M76.821 - Posterior tibial tendinitis Elongated hammer toe right foot.  The various methods of treatment have been discussed with the patient.  No guarantees were given.  After consideration of risks, benefits and other options for treatment, the patient has consented to surgery.  I have reviewed the patients' chart and labs.    PROCEDURE: ALL RIGHT LOWER EXTREMITY GASTROC RECESSION EVANS CALCANEAL OSTEOTOMY POSTERIOR TIBIAL TENDON REPAIR WITH FDL TENDON TRANSFER LAPIDUS BUNIONECTOMY WITH POSSIBLE AKIN OSTEOTOMY 5.   POSSIBLE SECOND PIPJ ARTHRODESIS  A history and physical examination was performed in my office.  The patient was reexamined.  There have been no changes to this history and physical examination.  Caroline More, DPM

## 2021-10-01 NOTE — Anesthesia Procedure Notes (Addendum)
Anesthesia Regional Block: Popliteal block   Pre-Anesthetic Checklist: , timeout performed,  Correct Patient, Correct Site, Correct Laterality,  Correct Procedure, Correct Position, site marked,  Risks and benefits discussed,  Surgical consent,  Pre-op evaluation,  At surgeon's request and post-op pain management  Laterality: Right  Prep: chloraprep       Needles:  Injection technique: Single-shot  Needle Type: Echogenic Needle     Needle Length: 9cm  Needle Gauge: 21     Additional Needles:   Procedures:,,,, ultrasound used (permanent image in chart),,    Narrative:  Injection made incrementally with aspirations every 5 mL.  Performed by: Personally   Additional Notes: Functioning IV was confirmed and monitors applied. Ultrasound guidance: relevant anatomy identified, needle position confirmed, local anesthetic spread visualized around nerve(s)., vascular puncture avoided.  Image printed for medical record.  Negative aspiration and no paresthesias; incremental administration of local anesthetic. The patient tolerated the procedure well. Vitals signes recorded in RN notes.

## 2021-10-01 NOTE — OR Nursing (Signed)
In and out catheterization performed by Diane RN following procedure and prior to emergence from anesthesia. Approx. 400 cc urine voided.

## 2021-10-01 NOTE — Progress Notes (Signed)
Assisted Ben Redmon ANMD with right, popliteal/saphenous block. Side rails up, monitors on throughout procedure. See vital signs in flow sheet. Tolerated Procedure well.

## 2021-10-01 NOTE — Op Note (Signed)
PODIATRY / FOOT AND ANKLE SURGERY OPERATIVE REPORT    SURGEON: Caroline More, DPM  PRE-OPERATIVE DIAGNOSIS: All right foot 1.  Gastroc equinus 2.  Pes planus foot type 3.  Hammertoe right second toe with elongation of toe 4.  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  Tear of the posterior tibial tendon with severe tendinosis 6.  Sinus tarsitis  POST-OPERATIVE DIAGNOSIS: Same  PROCEDURE(S): All right foot Gastroc recession Evans calcaneal osteotomy Posterior tibial tendon repair with flexor digitorum longus tendon transfer Lapidus bunionectomy Second hammertoe repair -PIPJ arthrodesis with K wire fixation  HEMOSTASIS: Right thigh tourniquet  ANESTHESIA: general  ESTIMATED BLOOD LOSS: 80 cc  FINDING(S): 1.  Severe pes planus foot type with hypermobile first ray 2.  Severe degeneration of the posterior tibial tendon with large amount of tenosynovitis and tendinosis  PATHOLOGY/SPECIMEN(S): None  INDICATIONS:   Destiny Singh is a 51 y.o. female who presents with pain to the right medial ankle and around the bunion area as well as second toe pain.  Patient had been treated conservatively for a number of months with conservative measures consisting of change in shoe gear, orthoses, boot, bracing, physical therapy, NSAIDs and Tylenol, steroid injections and oral steroids.  Patient failed to have relief.  An MRI was taken showing severe sinus tarsitis as well as a tear in the posterior tibial tendon with increased thickness of the posterior tibial tendon.  X-ray imaging also showed a hammertoe contracture as well as mild hallux valgus contracture but patient appeared to have hypermobility noted about the first tarsometatarsal joint with extremely elevated first ray.  All treatment options were discussed with this patient of both conservative and surgical attempts at correction include potential risks and complications of surgical intervention, all questions  answered, once the benefits and complications of surgical intervention were discussed patient is elected for surgical procedure described above.  No guarantees given.  Consent obtained prior to procedure..  DESCRIPTION: After obtaining full informed written consent, the patient was brought back to the operating room and placed supine upon the operating table.  The patient received IV antibiotics prior to induction.  After obtaining adequate anesthesia, the patient was prepped and draped.  An Esmarch bandage was used to exsanguinate the right lower extremity and pneumatic thigh tourniquet was inflated.  Attention was directed the posterior aspect of the lower leg distal to the Myo tendinous junction.  Blunt dissection was continued down to the deep fascia.  All venous contributories were cauterized as necessary.  The sural nerve was identified and retracted throughout the remainder the case.  A incision was made into the deep fascia and the gastroc aponeurosis was identified along with the plantaris tendon.  The gastroc aponeurosis and plantaris tendon were transected at the operative site.  The surgical site was flushed with copious amounts normal sterile saline.  The deep fascia was then reapproximated well coapted with 3-0 Vicryl along with subcutaneous tissue.  The skin was then closed with skin staples.  Attention was then directed to the right lateral foot where an incision was made along the lateral aspect of the calcaneus starting from the anterior process of the calcaneus to the lateral process of the talus.  The incision was deepened to the subcutaneous tissues utilizing sharp and blunt dissection and care was taken to identify and retract all vital neurovascular structures and all venous contributories were cauterized as necessary.  At this time the peroneal tendons were identified as well as the extensor digitorum  brevis muscle belly.  The peroneal tendon sheath was opened and the peroneal  tendons were retracted posteriorly and plantarly throughout the remainder the case.  The extensor digitorum brevis muscle belly was dissected free off of the anterior process of the calcaneus thereby exposing the lateral wall the calcaneus and anterior process.  The calcaneocuboid joint was identified but not incised.  At this time a sagittal bone saw was used to create an osteotomy at the lateral wall the calcaneus and was measured to be approximately 1-1/2 to 2 cm proximal to the calcaneocuboid joint parallel to the joint.  A temporary wire was placed under fluoroscopic guidance in the area to guide the cut.  Once the osteotomy was performed the wire was removed and a osteotome was placed into the osteotomy site.  The osteotomy was then freed up further.  The Paragon 28 pin distractor was then placed distract the area open and try wedges were then placed.  An 8 mm trial wedge appeared to reduce the talonavicular joint and the lateral column appeared to be in a fairly rectus position indicative of reduction of flatfoot type.  The calcaneus also appeared to be elevated at the inclination angle and the talar declination also appeared to be improved.  The 8 mm wedge was then placed in the osteotomy site with excellent compression noted once the distraction device was removed.  At this time there did appear to be a little bit of mobility at the calcaneocuboid joint so it was determined at this time to apply a lateral plate.  A 3 hole Paragon 28 plate plate was placed across the Evans calcaneal osteotomy site with 1 hole being in the body of the calcaneus and to being in the anterior process.  Temporary fixation was obtained with all of wires and fluoroscopic guidance was used to indicate successful placement of plate.  After the all of wires were removed utilizing standard AO principles and techniques 3 locking screws then placed with excellent fixation noted.  The osteotomy site appeared to be well stable.  The  surgical site was flushed with copious amounts normal sterile saline.  The peroneal tendon sheath and extensor digitorum brevis muscle bellies were reapproximated well coapted with 3-0 Vicryl along with the subcutaneous tissue and the skin was then reapproximated well coapted with skin staples.  Attention was then directed to the right medial ankle where a incision was made along the course the posterior tibial tendon starting from the medial malleolus posteriorly to the navicular tuberosity.  The incision was deepened through the subcutaneous tissues utilizing sharp and blunt dissection and care was taken to identify retract all vital neurovascular structures all venous contributories were cauterized necessary.  At this time the posterior tibial tendon sheath was identified and incised.  The tendon sheath was noted to be very thickened and appeared to have a large amount of synovitic fluid coming from the area once it was incised.  The tendon appeared to be very thickened and appeared to have a split thickness tear about the incision line that ended at the level of the medial malleolus.  The navicular tuberosity was incised and a full-thickness flap was created off the navicular tuberosity removing some of the posterior tibial tendon to gain access to the underside of the navicular tuberosity.  Any synovitic debris or tissue as well as inflamed tissue and the tendon was resected and passed off the operative site.  The Topaz microdebridement instrument was then used to debride the posterior tibial tendon  yet further.  The posterior tibial tendon was then tubularized with 2-0 Vicryl in a running interconnected stitch.  Dissection was then continued deeper in the area to expose the flexor digitorum longus tendon sheath.  Once the sheath was identified it was opened and the flexor digitorum longus tendon was identified throughout the entirety of the incision.  The flexor digitorum longus tendon was cut as far  distally as possible to create as much length as possible needed for the tendon transfer.  The whipstitch was then performed through the flexor digitorum longus tendon.  At this time the guidewire for the reamer for the [employer] Bio-Tenodesis screw was then placed through the tuberosity navicular at the appropriate orientation from plantar to dorsal.  Once the guidewires in place the reamer was then placed over the guidewire to ream approximately 20 mm deep.  This was performed under fluoroscopic guidance.  The whipstitch was then passed through the tunnel from plantar to dorsal to the top of the foot.  The foot was then held in a slightly inverted position and the tendon was brought through the whole with the appropriate tension.  Once held with the appropriate tension the 20 mm Bio-Tenodesis screw was then placed into the hole with the flexor tendon to complete the tendon transfer.  The suture that was then brought to the top of the foot was sutured into some of the posterior tibial tendon to reinforce the repair with 3 needles.  The remaining suture coming from the anchor was then sutured through the posterior tibial tendon to reinforce the posterior tibial tendon back down onto the navicular tuberosity.  The flexor tendon and posterior tibial tendon were then reinforced further together with 2-0 Vicryl.  The surgical site was flushed with copious amounts normal sterile saline.  The posterior tibial tendon sheath was reapproximated well coapted with 3-0 Vicryl along with the subcutaneous tissue.  The skin was then reapproximated well coapted with skin staples.  The pneumatic thigh tourniquet was deflated and a prompt hyperemic response was noted all digits of the right foot while suturing.  1 suture in an Esmarch bandage was used to exsanguinate the right lower extremity again and the pneumatic thigh tourniquet was inflated.  Attention was then directed to the first tarsometatarsal joint dorsally and first  metatarsal phalangeal joint where a linear longitudinal incisions were made across those areas.  Incisions were deepened through the subcutaneous tissues utilizing sharp and blunt dissection care was taken to identify and retract all vital neurovascular structures and all venous contributories were cauterized as necessary.  An incision was made into the first tarsometatarsal joint medial to the tendon of the extensor hallucis longus involve the contour of the deformity.  The Periosteal Tissue Was Reflected Medially and Laterally at the Operative Site Thereby Exposing the First Tarsometatarsal Joint.  Dissection was then continued deeper at the first metatarsal phalangeal joint level and the neurovascular bundle was identified and retracted throughout the remainder the case.  A medial capsulotomy was then performed to the first metatarsal phalangeal joint reflecting the capsular tissues dorsally and plantarly thereby exposing the first metatarsal phalangeal joint.  There appeared to be minimal to no cartilage damage present.  The medial eminence was resected with a sagittal bone saw and passed off the operative site.  A small section of the capsule was also resected as it appeared to be grossly enlarged and thickened.  Attention was directed to the first interspace area where a percutaneous incision was made into this  area and a lateral lease was performed releasing the lateral and collateral ligaments as well as suspensory ligaments and abductor hallucis tendon completing the lateral release.  Attention was then directed back to the first tarsometatarsal joint where an osteotomy was created through the first tarsometatarsal joint freeing up the joint.  Once this was performed curette and osteotome debridement was used to remove the cartilage from the first tarsometatarsal joint area.  At this time it was determined to use a 5 mm Paragon 28 Lapidus wedge.  A flush was performed with copious amounts normal  sterile saline.  The wedge was prepared and placed into the joint after fenestration was performed and noted to have excellent fitting.  Some bone graft/bone putty was also placed into the first tarsometatarsal fusion site for adequate fixation.  The first metatarsal was rotated into the appropriate position and the intermetatarsal angle was reduced manually while holding the foot in a neutral position plantar flexing the first ray.  Once this was performed a temporary fixation wire was placed across the first tarsometatarsal joint with excellent compression.  Another wire was then placed from dorsal distal at the first metatarsal base across the first tarsometatarsal joint into the plantar proximal medial cuneiform.  This was performed under fluoroscopic guidance.  A Paragon 28 4.0 x 40 mm partially-threaded headed screw was then placed across the first tarsometatarsal with excellent compression noted.  The temporary wires were removed and the medial plate from Paragon 28 was placed with 4 screws in total, 3 locking screws and 1 nonlocking screw with the appropriate placement.  This was also done under fluoroscopic guidance with excellent compression and fixation noted.  Attention was then directed to the first metatarsal phalangeal joint where a section of the capsule medially was resected and passed off the operative site to create a medial capsulorrhaphy.  After a flush was performed with copious amounts normal sterile saline and the periosteal and capsular structures were reapproximated well coapted with 3-0 Vicryl.  The subcutaneous tissue was reapproximated well coapted with 3-0 Vicryl.  The skin at the first metatarsal phalangeal joint was reapproximated well coapted with 3-0 nylon in the first tarsometatarsal joint was reapproximated well coapted with skin staples.  The pneumatic ankle tourniquet was deflated and a prompt hyperemic response was noted all digits of the right foot during  suturing.  Attention was then directed to the second toe at the PIPJ where 2 semielliptical incisions were made over the PIPJ removing the skin to the area creating an ellipse.  The PIPJ was identified and a extensor tenotomy and capsulotomy was performed followed by release of the collateral and suspensory ligaments.  The sagittal bone saw was then used to resect the head of the proximal phalanx and a small portion of the base of the middle phalanx.  Once the digit appear to be adequately shortened and in a rectus position a flush was performed with copious amounts normal sterile saline and pilot hole was then made into the proximal phalanx for K wire fixation.  The 0.042 K wire was then placed through the middle phalanx and distal phalanx and then retrograde drilled through the proximal phalanx while holding the toe in a rectus position.  The surgical site was once again flushed with copious amounts normal sterile saline.  The extensor tendon was then reapproximated well coapted with 3-0 Vicryl and the skin was then reapproximated well coapted with 3-0 nylon with simple type stitching.  A postoperative dressing was then applied consisting  of Xeroform followed by 4 x 4 gauze, Kerlix, Webril, posterior splint, Ace wrap.  Patient tolerated the procedure and anesthesia well and was transferred to recovery room vital signs stable vascular status intact all toes the right foot.  Following a period of postoperative monitoring the patient be discharged back to the inpatient room with the appropriate orders and instructions as well as medications.  Patient is to follow-up in clinic 1 week after surgical date and is to remain nonweightbearing to the right lower extremity at all times.  COMPLICATIONS: None  CONDITION: Good, stable  Caroline More, DPM

## 2021-10-01 NOTE — Anesthesia Procedure Notes (Signed)
Procedure Name: LMA Insertion Date/Time: 10/01/2021 8:04 AM  Performed by: Dionne Bucy, CRNAPre-anesthesia Checklist: Patient identified, Patient being monitored, Timeout performed, Emergency Drugs available and Suction available Patient Re-evaluated:Patient Re-evaluated prior to induction Oxygen Delivery Method: Circle system utilized Preoxygenation: Pre-oxygenation with 100% oxygen Induction Type: IV induction Ventilation: Mask ventilation without difficulty LMA: LMA inserted LMA Size: 4.0 Tube type: Oral Number of attempts: 1 Placement Confirmation: positive ETCO2 and breath sounds checked- equal and bilateral Tube secured with: Tape Dental Injury: Teeth and Oropharynx as per pre-operative assessment

## 2021-10-01 NOTE — Anesthesia Preprocedure Evaluation (Signed)
Anesthesia Evaluation  Patient identified by MRN, date of birth, ID band Patient awake    Reviewed: Allergy & Precautions, H&P , NPO status , Patient's Chart, lab work & pertinent test results, reviewed documented beta blocker date and time   Airway Mallampati: III  TM Distance: >3 FB Neck ROM: full    Dental  (+) Teeth Intact   Pulmonary shortness of breath, asthma ,    Pulmonary exam normal        Cardiovascular Exercise Tolerance: Good hypertension, On Medications + Past MI  Normal cardiovascular exam+ dysrhythmias  Rhythm:regular Rate:Normal     Neuro/Psych  Headaches, negative psych ROS   GI/Hepatic negative GI ROS, Neg liver ROS,   Endo/Other  negative endocrine ROSdiabetes, Well Controlled, Type 2, Oral Hypoglycemic Agents  Renal/GU negative Renal ROS  negative genitourinary   Musculoskeletal   Abdominal   Peds  Hematology negative hematology ROS (+)   Anesthesia Other Findings Past Medical History: No date: Adopted No date: Arthritis     Comment:  BOTH KNEES No date: Asthma No date: Benign tumor No date: Diabetes mellitus without complication (HCC) No date: Dyspnea     Comment:  VERY RARE No date: Dysrhythmia     Comment:  IRREGULAR PER PT No date: Headache No date: Hypertension No date: Myocardial infarction ([place])     Comment:  AGE 20 Past Surgical History: No date: BREAST SURGERY; Bilateral     Comment:  BENIGN TUMORS 04/01/2016: CHONDROPLASTY; Right     Comment:  Procedure: CHONDROPLASTY -ABRASION OF FEMORAL TROCHLEA,               Fr[place];  Surgeon: Corky Mull, MD;                Location: [facility] ORS;  Service: Orthopedics;  Laterality:               Right; 04/01/2016: KNEE ARTHROSCOPY WITH MENISCAL REPAIR; Right     Comment:  Procedure: KNEE ARTHROSCOPY WITH PARTIAL MEDIAL                MENISCAL REPAIR;  Surgeon: Corky Mull, MD;  Location:               [facility] ORS;  Service:  Orthopedics;  Laterality: Right; No date: TUBAL LIGATION 2016: TUMOR EXCISION     Comment:  BENIGN-HEAD   Reproductive/Obstetrics negative OB ROS                             Anesthesia Physical  Anesthesia Plan  ASA: III  Anesthesia Plan: General and Regional   Post-op Pain Management: Regional block   Induction: Intravenous  PONV Risk Score and Plan: 4 or greater and Ondansetron, Promethazine and Dexamethasone  Airway Management Planned: LMA  Additional Equipment:   Intra-op Plan:   Post-operative Plan:   Informed Consent: I have reviewed the patients History and Physical, chart, labs and discussed the procedure including the risks, benefits and alternatives for the proposed anesthesia with the patient or authorized representative who has indicated his/her understanding and acceptance.     Dental Advisory Given  Plan Discussed with: CRNA  Anesthesia Plan Comments:         Anesthesia Quick Evaluation

## 2021-10-05 ENCOUNTER — Encounter: Payer: Self-pay | Admitting: Podiatry

## 2021-10-06 ENCOUNTER — Encounter: Payer: Self-pay | Admitting: Podiatry

## 2021-10-16 IMAGING — MG DIGITAL DIAGNOSTIC BILAT W/ TOMO W/ CAD
6 of 10 series · 6 of 30 positions shown · non-contrast
Comparison: Previous exam(s).

CLINICAL DATA: LEFT breast palpable area, history of bilateral
excisional biopsies.

EXAM:
DIGITAL DIAGNOSTIC BILATERAL MAMMOGRAM WITH TOMOSYNTHESIS AND CAD;
ULTRASOUND LEFT BREAST LIMITED
TECHNIQUE: Bilateral digital diagnostic mammography and breast tomosynthesis
was performed. The images were evaluated with computer-aided
detection.; Targeted ultrasound examination of the left breast was
performed

[R MLO synth-2D]
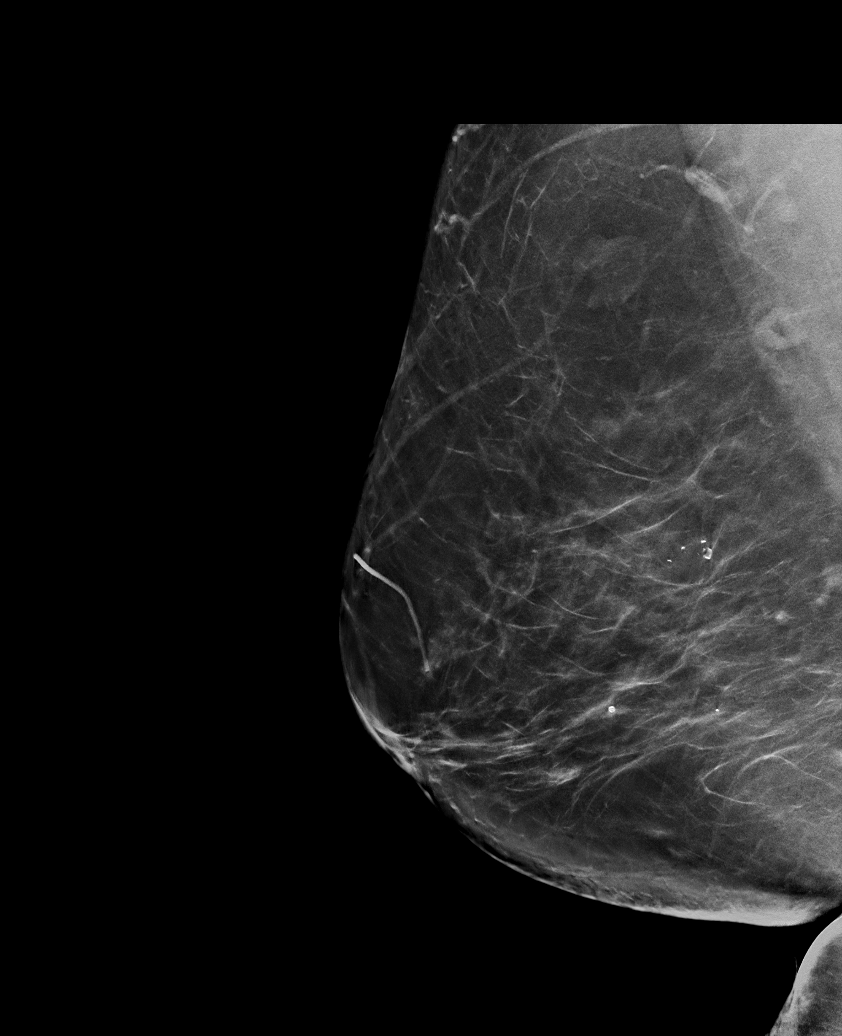

[R CC synth-2D]
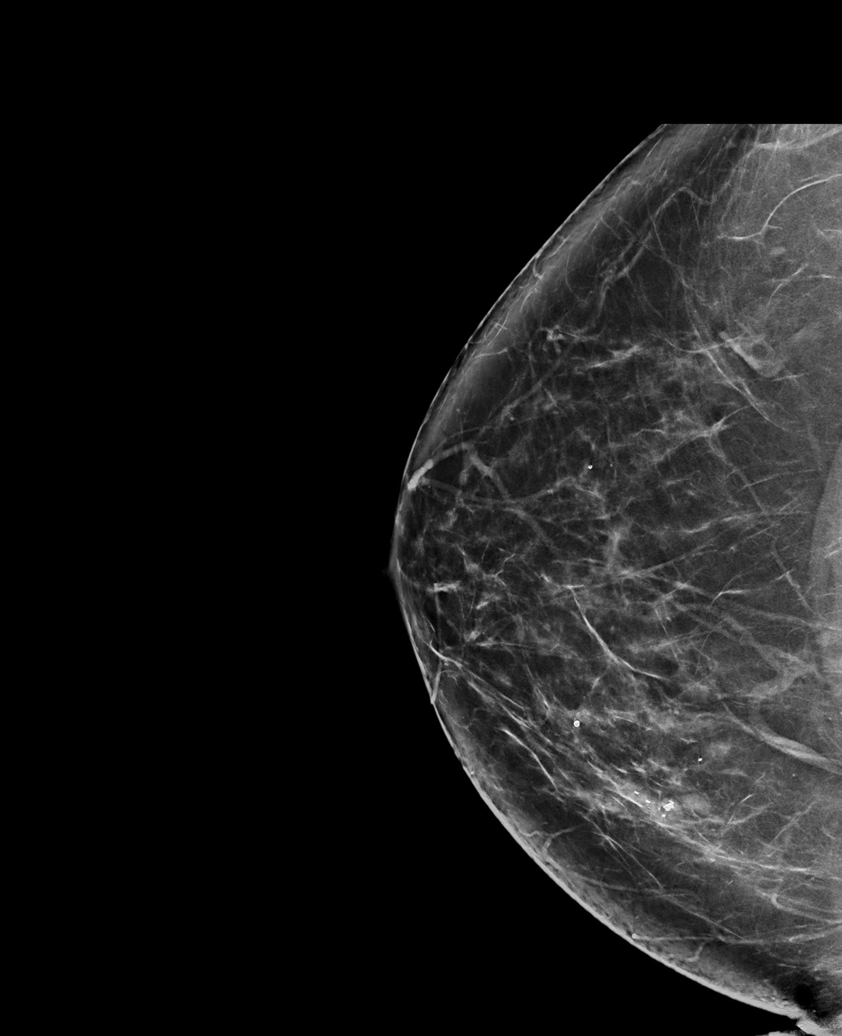

[L SIO synth-2D]
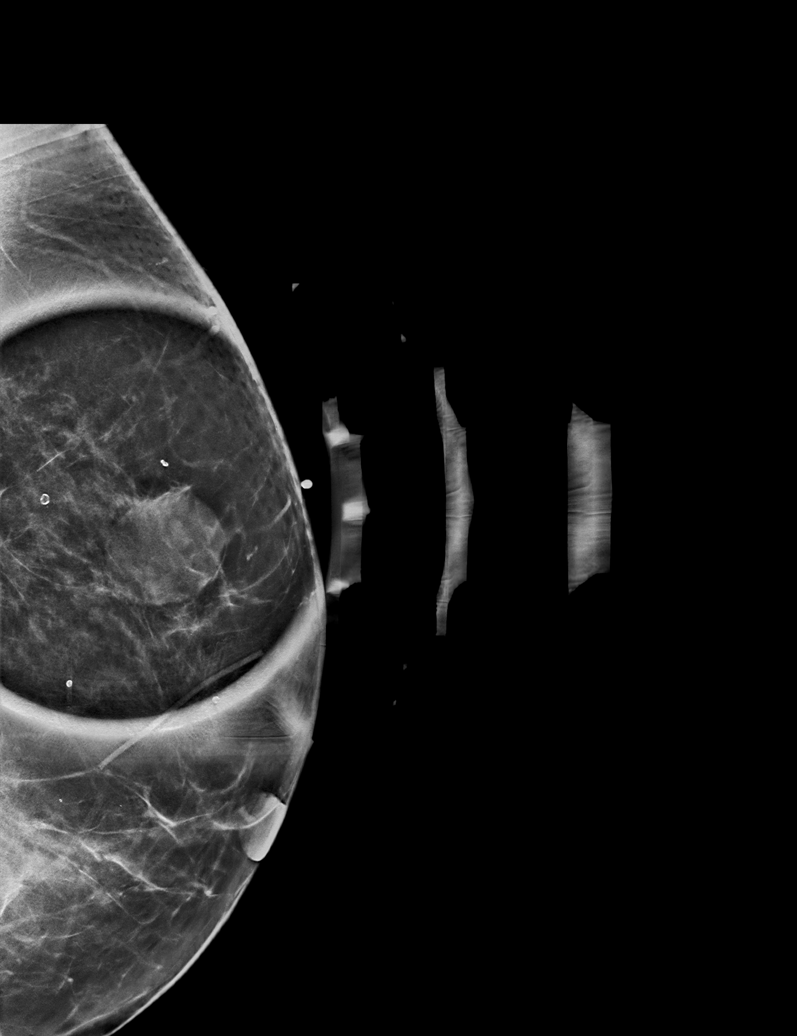

[L CC synth-2D]
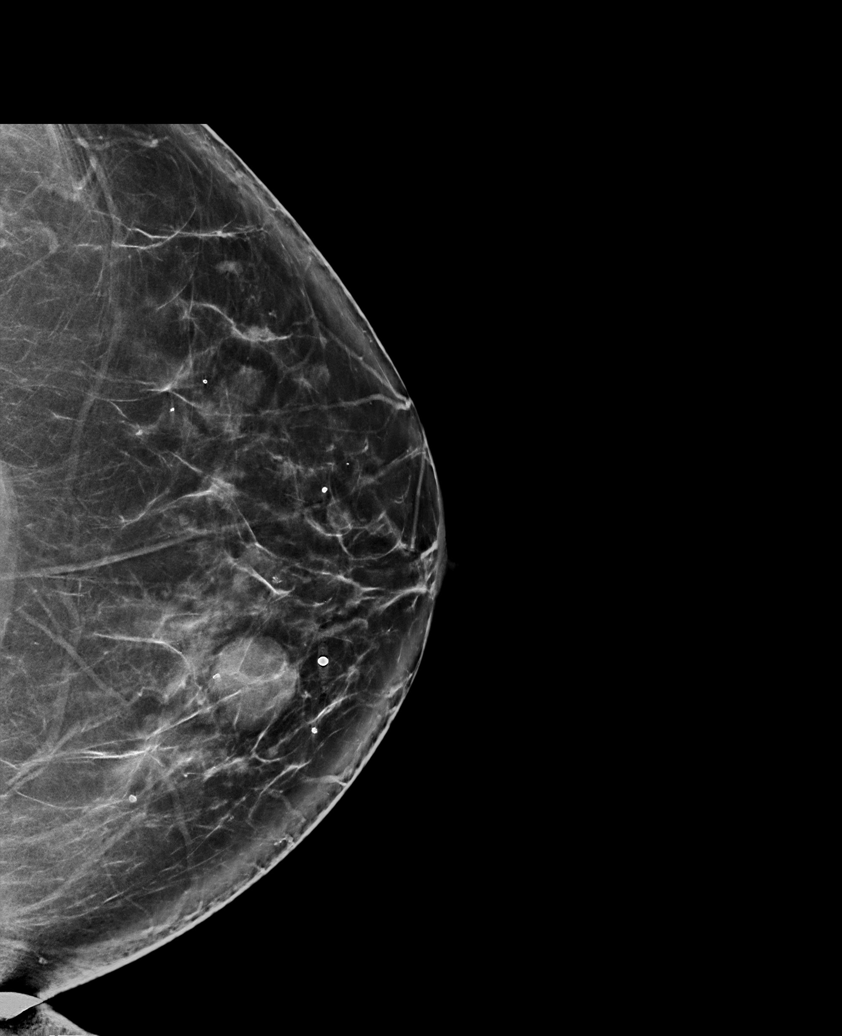

[L MLO synth-2D]
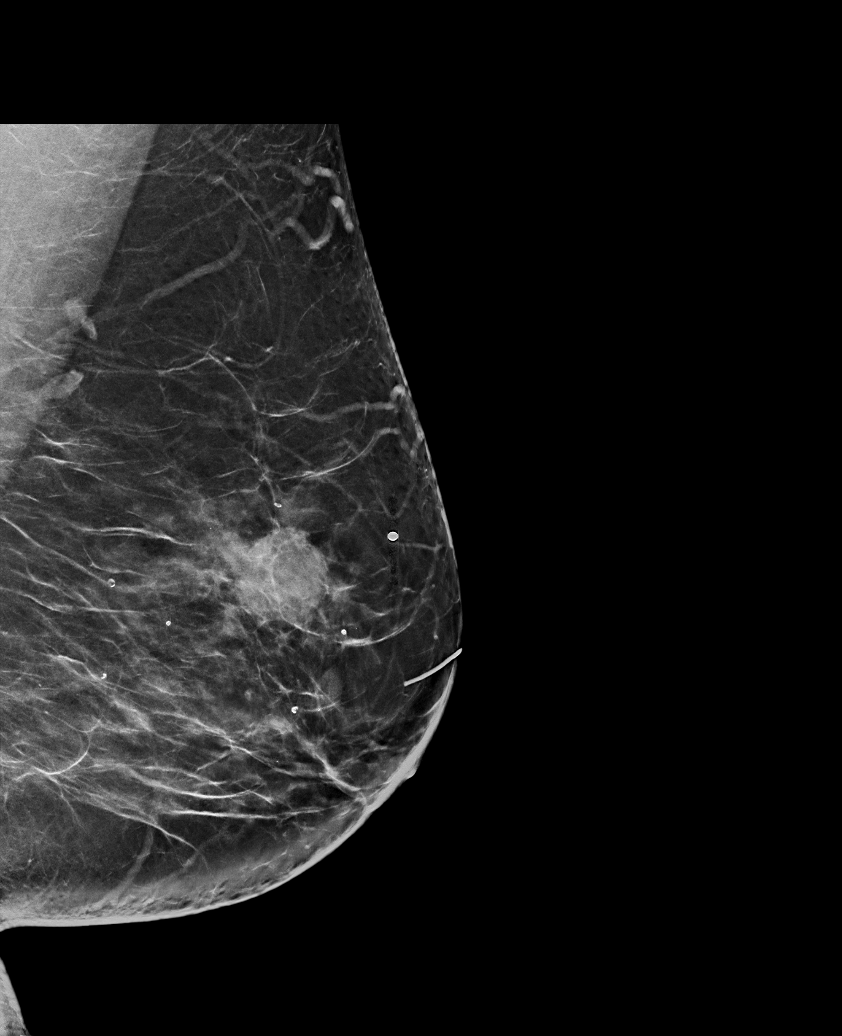

[L SIO tomo · tomo slice 33/65.0]
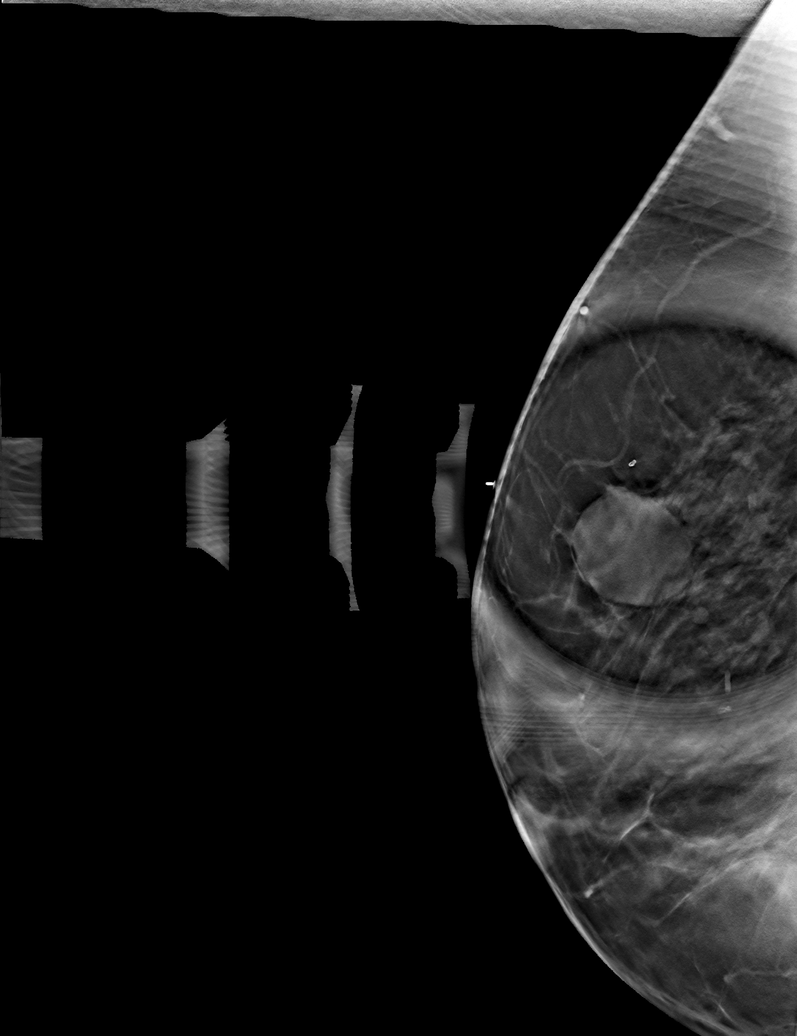

[6 of 30 positions shown; findings below may reference images not displayed]

ACR Breast Density Category c: The breast tissue is heterogeneously
dense, which may obscure small masses.
FINDINGS: Spot compression tomosynthesis views demonstrate an oval
circumscribed mass adjacent to the site of palpable concern in the
LEFT upper inner breast. There are multiple round and oval masses
which have waxed and waned since prior exam. These are most
consistent with benign cysts. No suspicious mass, distortion, or
microcalcifications are identified to suggest presence of
malignancy.

On physical exam, there is a soft mass in the LEFT upper inner
breast.

Targeted ultrasound was performed of the LEFT upper inner breast. At
11 o'clock 5 cm from the nipple, there is an oval circumscribed
anechoic mass with posterior acoustic enhancement. It measures 20 x
20 x 25 mm and is consistent with a benign simple cyst.
IMPRESSION: 1. There is a benign simple cyst at the site of palpable concern in
the LEFT breast. Ultrasound-guided aspiration of the cyst could be
performed for symptomatic relief if per patient preference.
2. No mammographic evidence of malignancy bilaterally.

RECOMMENDATION:
Screening mammogram in one year.(Code:KF-Y-GKL)

I have discussed the findings and recommendations with the patient.
If applicable, a reminder letter will be sent to the patient
regarding the next appointment.

BI-RADS CATEGORY  2: Benign.

## 2021-10-16 IMAGING — US US BREAST*L* LIMITED INC AXILLA
1 series · 6 of 6 positions shown · non-contrast
Comparison: Previous exam(s).

CLINICAL DATA: LEFT breast palpable area, history of bilateral
excisional biopsies.

EXAM:
DIGITAL DIAGNOSTIC BILATERAL MAMMOGRAM WITH TOMOSYNTHESIS AND CAD;
ULTRASOUND LEFT BREAST LIMITED
TECHNIQUE: Bilateral digital diagnostic mammography and breast tomosynthesis
was performed. The images were evaluated with computer-aided
detection.; Targeted ultrasound examination of the left breast was
performed

[Series 1: us breast*left* limited inc axilla · 0.07mm/px · 6 of 6 slices shown]
[im 1/6]
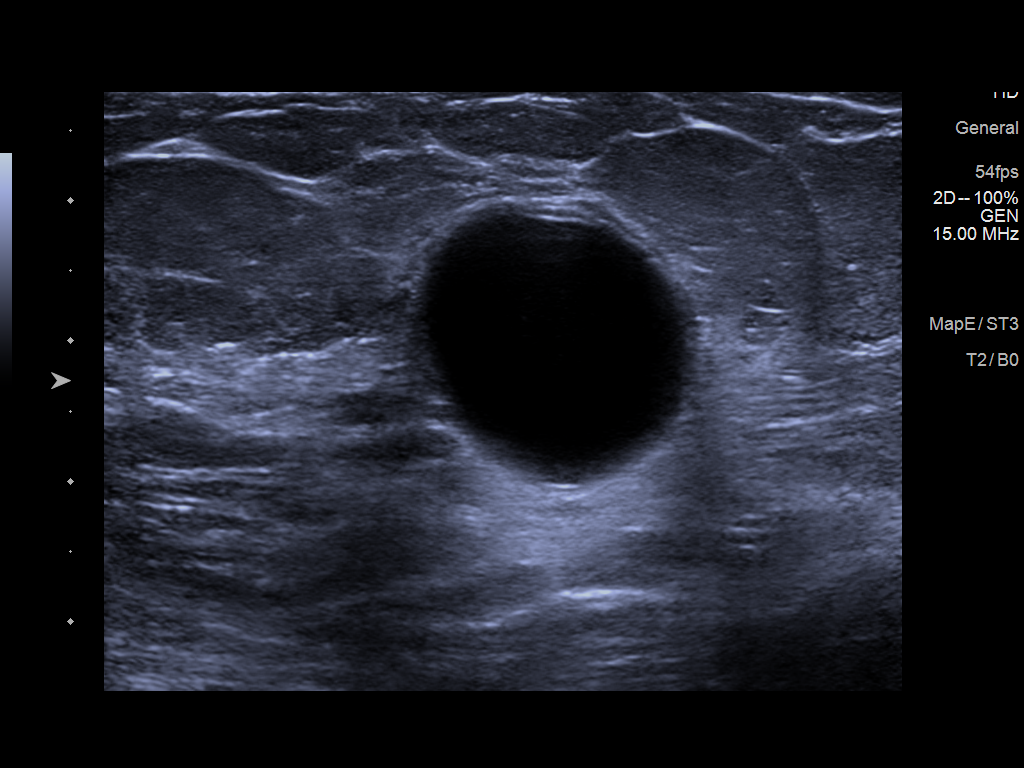
[im 2/6]
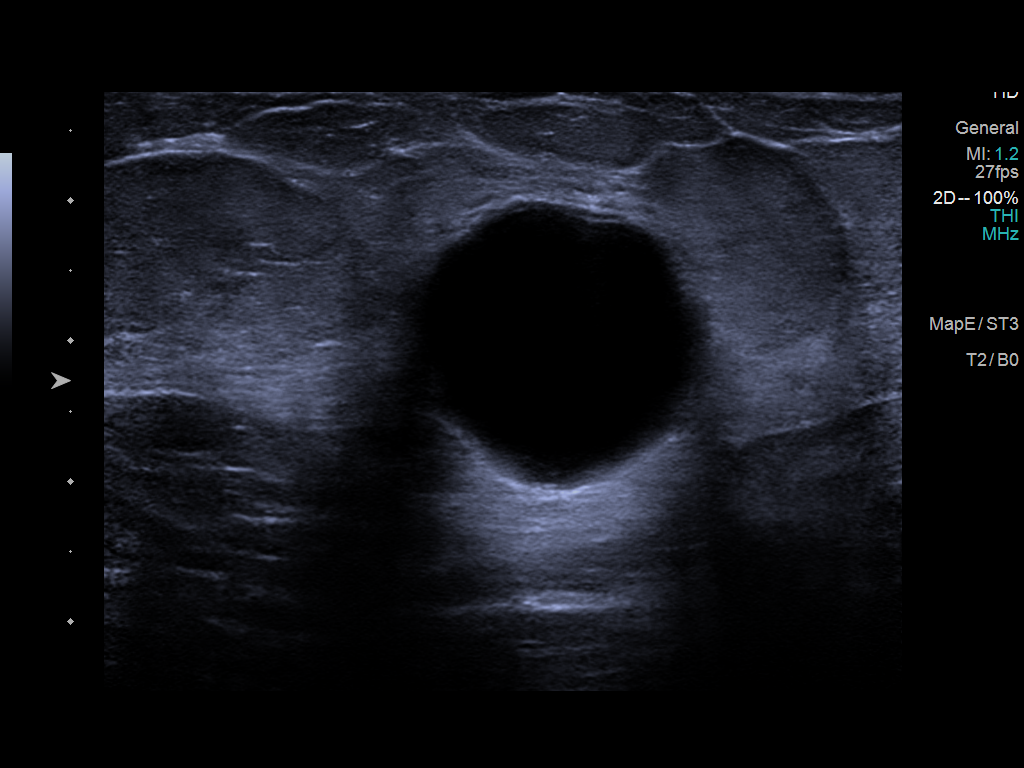
[im 3/6]
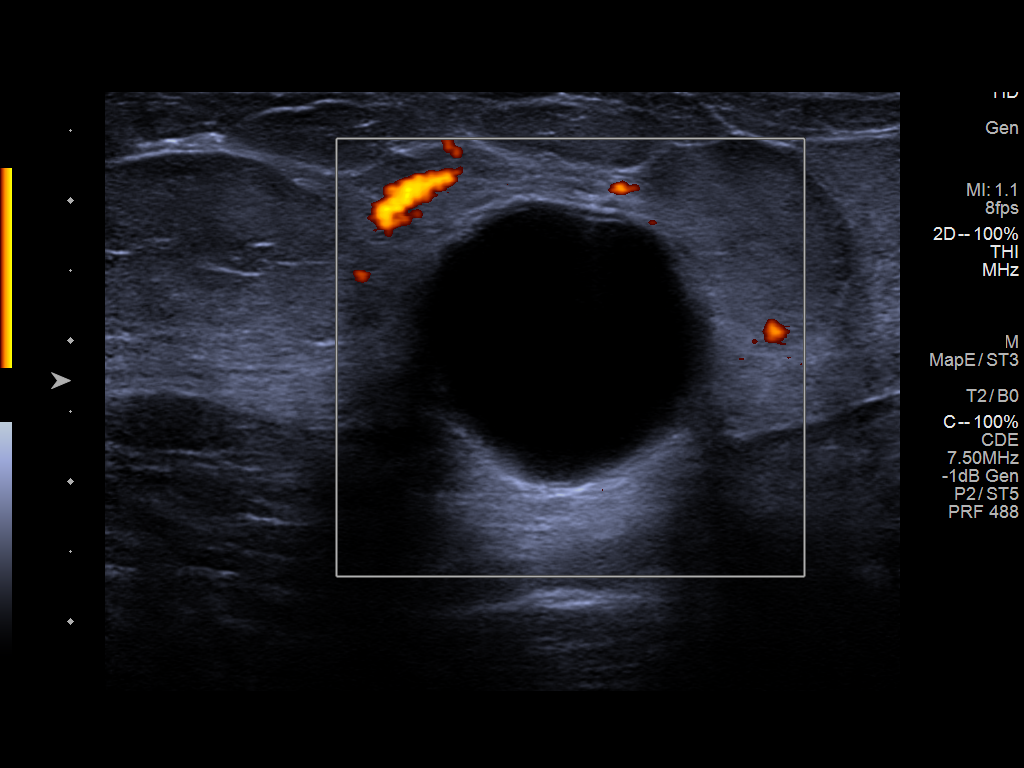
[im 4/6]
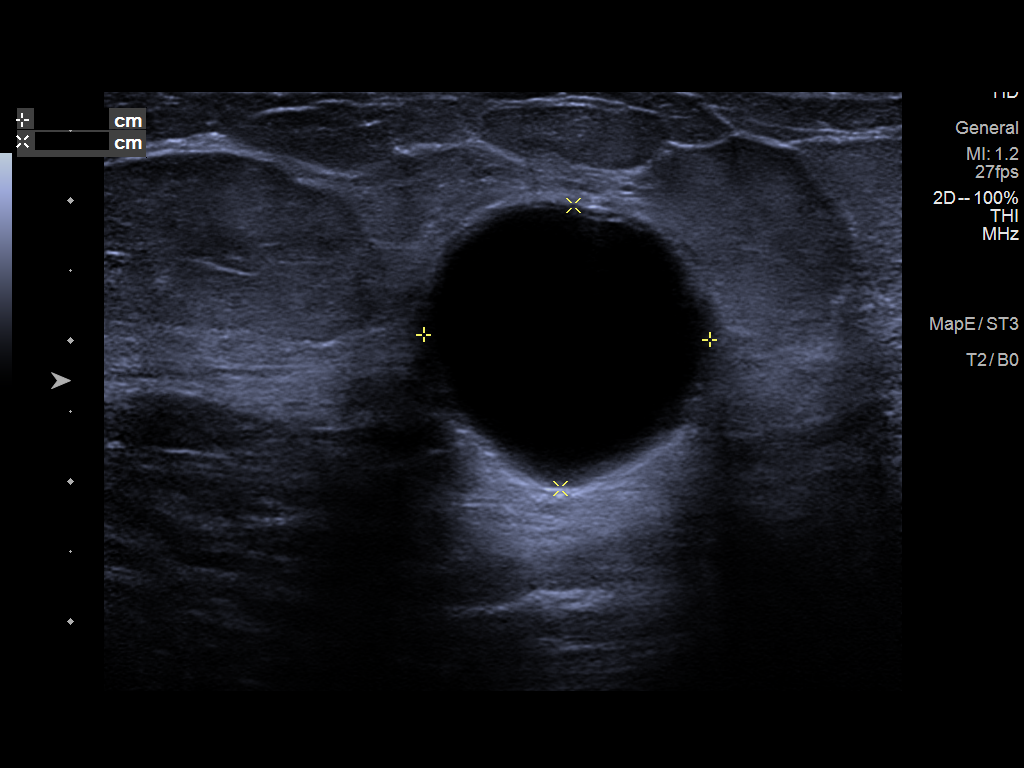
[im 5/6]
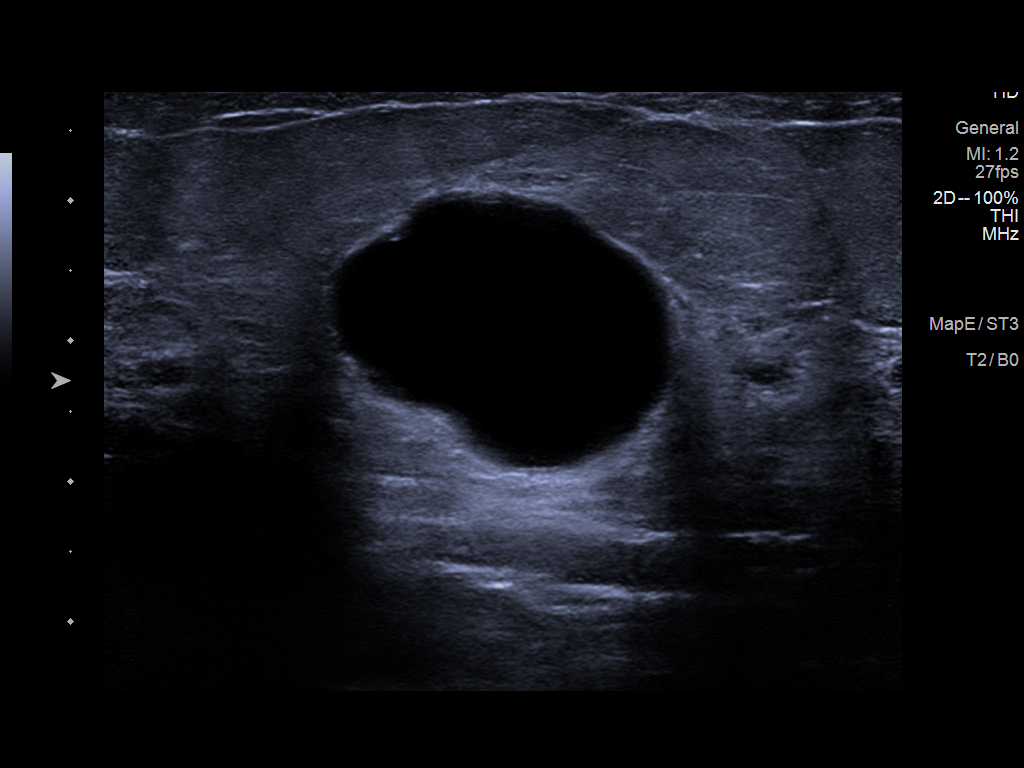
[im 6/6]
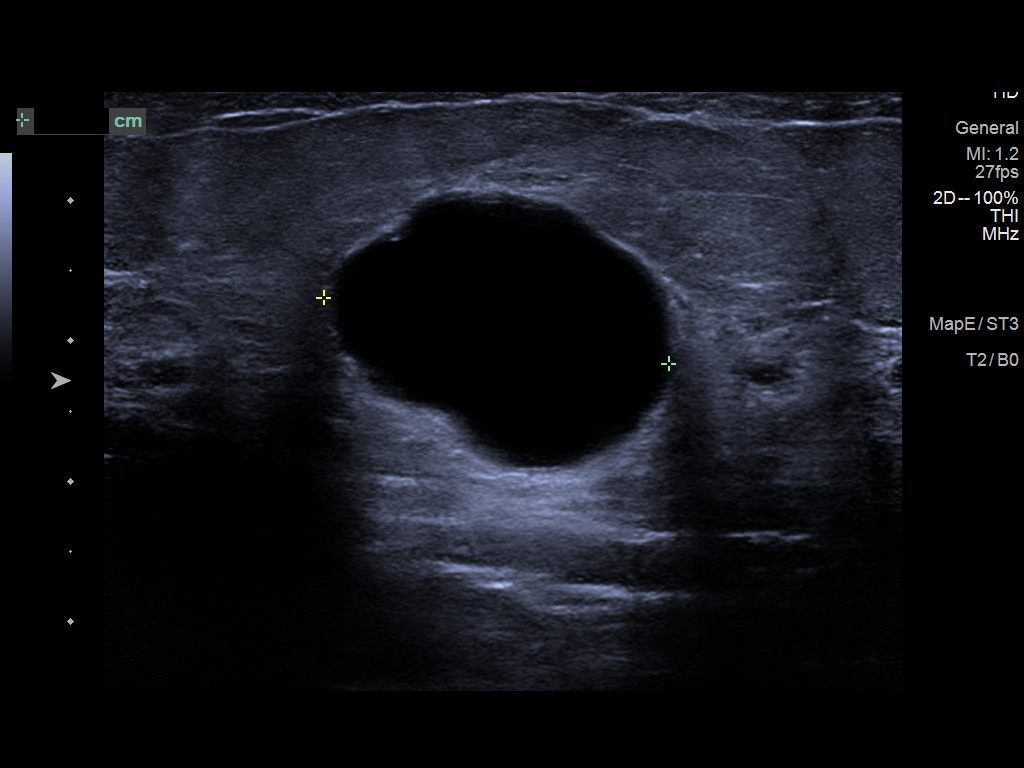

[6 of 6 positions shown; findings below may reference images not displayed]

ACR Breast Density Category c: The breast tissue is heterogeneously
dense, which may obscure small masses.
FINDINGS: Spot compression tomosynthesis views demonstrate an oval
circumscribed mass adjacent to the site of palpable concern in the
LEFT upper inner breast. There are multiple round and oval masses
which have waxed and waned since prior exam. These are most
consistent with benign cysts. No suspicious mass, distortion, or
microcalcifications are identified to suggest presence of
malignancy.

On physical exam, there is a soft mass in the LEFT upper inner
breast.

Targeted ultrasound was performed of the LEFT upper inner breast. At
11 o'clock 5 cm from the nipple, there is an oval circumscribed
anechoic mass with posterior acoustic enhancement. It measures 20 x
20 x 25 mm and is consistent with a benign simple cyst.
IMPRESSION: 1. There is a benign simple cyst at the site of palpable concern in
the LEFT breast. Ultrasound-guided aspiration of the cyst could be
performed for symptomatic relief if per patient preference.
2. No mammographic evidence of malignancy bilaterally.

RECOMMENDATION:
Screening mammogram in one year.(Code:KF-[id number])

I have discussed the findings and recommendations with the patient.
If applicable, a reminder letter will be sent to the patient
regarding the next appointment.

BI-RADS CATEGORY  2: Benign.

## 2021-11-02 ENCOUNTER — Telehealth: Payer: Self-pay

## 2021-11-02 NOTE — Telephone Encounter (Signed)
SS appointment >> 01/06/22-Toni

## 2021-11-23 ENCOUNTER — Ambulatory Visit: Payer: 59 | Admitting: Physician Assistant

## 2021-12-10 ENCOUNTER — Other Ambulatory Visit: Payer: Self-pay | Admitting: Physician Assistant

## 2021-12-10 DIAGNOSIS — I10 Essential (primary) hypertension: Secondary | ICD-10-CM

## 2021-12-14 ENCOUNTER — Ambulatory Visit (INDEPENDENT_AMBULATORY_CARE_PROVIDER_SITE_OTHER): Payer: 59 | Admitting: Physician Assistant

## 2021-12-14 ENCOUNTER — Encounter: Payer: Self-pay | Admitting: Physician Assistant

## 2021-12-14 VITALS — BP 140/82 | HR 74 | Temp 97.8°F | Resp 16 | Ht 68.5 in | Wt 279.0 lb

## 2021-12-14 DIAGNOSIS — G4710 Hypersomnia, unspecified: Secondary | ICD-10-CM

## 2021-12-14 DIAGNOSIS — I10 Essential (primary) hypertension: Secondary | ICD-10-CM

## 2021-12-14 DIAGNOSIS — E119 Type 2 diabetes mellitus without complications: Secondary | ICD-10-CM

## 2021-12-14 LAB — POCT GLYCOSYLATED HEMOGLOBIN (HGB A1C): Hemoglobin A1C: 6.5 % — AB (ref 4.0–5.6)

## 2021-12-14 NOTE — Progress Notes (Signed)
[facility] [place], [place] [address]  Internal MEDICINE  Office Visit Note  Patient Name: Destiny Singh  [id number]  [id number]  Date of Service: 12/18/2021  Chief Complaint  Patient presents with   Follow-up   Diabetes   Hypertension   Quality Metric Gaps    Eye exam and microalbumin    HPI Pt is here for routine follow up -she is recovering from surgery on her right foot. She is in a walking boot and using crutches to get around. -Goes back to podiatry on the 25th, and is in PT weekly now -BP has been doing ok at home. Has not taken BP meds yet today.  -Still taking rybelsus--paying out of pocket and would like to continue. Continuing to work on weight loss -will be doing HST in Nov -Needs eye exam  Current Medication: Outpatient Encounter Medications as of 12/14/2021  Medication Sig   Ashwagandha 500 MG CAPS Take 500 mg by mouth daily as needed (energy).   cholecalciferol (VITAMIN D3) 25 MCG (1000 UNIT) tablet Take 1,000 Units by mouth daily.   cyanocobalamin (,VITAMIN B-12,) 1000 MCG/ML injection Inject 1,000 mcg into the muscle See admin instructions. Every 2 months   ibuprofen (ADVIL) 800 MG tablet Take 1 tablet (800 mg total) by mouth every 8 (eight) hours as needed for mild pain.   losartan-hydrochlorothiazide (HYZAAR) 50-12.5 MG tablet Take 1 tablet by mouth daily.   metoprolol tartrate (LOPRESSOR) 50 MG tablet TAKE 1 TABLET BY MOUTH TWICE A [place]   ondansetron (ZOFRAN) 4 MG tablet Take 1 tablet (4 mg total) by mouth every 8 (eight) hours as needed for nausea or vomiting.   Semaglutide (RYBELSUS) 3 MG TABS Take 3 mg by mouth daily.   [DISCONTINUED] amoxicillin-clavulanate (AUGMENTIN) 500-125 MG tablet Take 1 tablet (500 mg total) by mouth 3 (three) times daily.   No facility-administered encounter medications on file as of 12/14/2021.    Surgical History: Past Surgical History:  Procedure Laterality Date   BREAST EXCISIONAL BIOPSY     in  teens per pt   BUNIONECTOMY Right 10/01/2021   Procedure: BUNIONECTOMY - LAPIDUS - TYPE;  Surgeon: Caroline More, DPM;  Location: [place];  Service: Podiatry;  Laterality: Right;   CALCANEAL OSTEOTOMY Right 10/01/2021   Procedure: EVANS/MCDO;  Surgeon: Caroline More, DPM;  Location: [place];  Service: Podiatry;  Laterality: Right;   CHONDROPLASTY Right 04/01/2016   Procedure: CHONDROPLASTY -ABRASION OF FEMORAL TROCHLEA, [place];  Surgeon: Corky Mull, MD;  Location: [facility];  Service: Orthopedics;  Laterality: Right;   GASTROC RECESSION EXTREMITY Right 10/01/2021   Procedure: GASTROC RECESSION;  Surgeon: Caroline More, DPM;  Location: [place];  Service: Podiatry;  Laterality: Right;   KNEE ARTHROSCOPY Left 06/18/2020   Procedure: LEFT KNEE ARTHROSCOPY WITH DEBRIDEMENT AND PARTIAL MEDIAL MENISCECTOMY;  Surgeon: Corky Mull, MD;  Location: [facility];  Service: Orthopedics;  Laterality: Left;   KNEE ARTHROSCOPY WITH MENISCAL REPAIR Right 04/01/2016   Procedure: KNEE ARTHROSCOPY WITH PARTIAL MEDIAL  MENISCAL REPAIR;  Surgeon: Corky Mull, MD;  Location: [facility];  Service: Orthopedics;  Laterality: Right;   TENDON TRANSFER Right 10/01/2021   Procedure: FDL TRANSFER; DEEP;  Surgeon: Caroline More, DPM;  Location: [place];  Service: Podiatry;  Laterality: Right;   TENOTOMY / FLEXOR TENDON TRANSFER Right 10/01/2021   Procedure: FLEXOR TENDON REPAIR;  Surgeon: Caroline More, DPM;  Location: [place];  Service: Podiatry;  Laterality:  Right;   TUBAL LIGATION     TUMOR EXCISION  2016   BENIGN-HEAD    Medical History: Past Medical History:  Diagnosis Date   Adopted    Arthritis    BOTH KNEES   Asthma    Benign tumor    Diabetes mellitus without complication (HCC)    Dyspnea    VERY RARE   Dysrhythmia    IRREGULAR PER PT   Headache    Hypertension    Myocardial infarction ([place])    AGE 51    Family History: Family  History  Adopted: Yes    Social History   Socioeconomic History   Marital status: Single    Spouse name: Not on file   Number of children: Not on file   Years of education: Not on file   Highest education level: Not on file  Occupational History   Not on file  Tobacco Use   Smoking status: Never   Smokeless tobacco: Never  Substance and Sexual Activity   Alcohol use: No   Drug use: No   Sexual activity: Not on file  Other Topics Concern   Not on file  Social History Narrative   Not on file   Social Determinants of Health   Financial Resource Strain: Not on file  Food Insecurity: Not on file  Transportation Needs: Not on file  Physical Activity: Not on file  Stress: Not on file  Social Connections: Not on file  Intimate Partner Violence: Not on file      Review of Systems  Constitutional:  Positive for fatigue. Negative for chills and unexpected weight change.  HENT:  Negative for congestion, postnasal drip, rhinorrhea, sneezing and sore throat.   Eyes:  Negative for redness.  Respiratory:  Negative for cough, chest tightness and shortness of breath.   Cardiovascular:  Negative for chest pain and palpitations.  Gastrointestinal:  Negative for abdominal pain, constipation, diarrhea, nausea and vomiting.  Genitourinary:  Negative for dysuria and frequency.  Musculoskeletal:  Positive for arthralgias and gait problem. Negative for back pain, joint swelling and neck pain.  Skin:  Negative for rash.  Neurological:  Negative for tremors and numbness.  Hematological:  Negative for adenopathy. Does not bruise/bleed easily.  Psychiatric/Behavioral:  Positive for sleep disturbance. Negative for behavioral problems (Depression) and suicidal ideas. The patient is not nervous/anxious.     Vital Signs: BP (!) 140/82   Pulse 74   Temp 97.8 F (36.6 C)   Resp 16   Ht 5' 8.5" (1.74 m)   Wt 279 lb (126.6 kg)   LMP 11/23/2018 (Approximate)   SpO2 96%   BMI 41.80 kg/m     Physical Exam Vitals and nursing note reviewed.  Constitutional:      Appearance: Normal appearance.  HENT:     Head: Normocephalic and atraumatic.     Nose: Nose normal.     Mouth/Throat:     Mouth: Mucous membranes are moist.     Pharynx: No posterior oropharyngeal erythema.  Eyes:     Extraocular Movements: Extraocular movements intact.     Pupils: Pupils are equal, round, and reactive to light.  Cardiovascular:     Rate and Rhythm: Normal rate and regular rhythm.     Pulses: Normal pulses.     Heart sounds: Normal heart sounds.  Pulmonary:     Effort: Pulmonary effort is normal.     Breath sounds: Normal breath sounds.  Abdominal:     Tenderness: There is  no abdominal tenderness.  Musculoskeletal:     Comments: Walking boot on right foot  Skin:    General: Skin is warm and dry.  Neurological:     General: No focal deficit present.     Mental Status: She is alert.     Gait: Gait abnormal.  Psychiatric:        Mood and Affect: Mood normal.        Behavior: Behavior normal.        Assessment/Plan: 1. Type 2 diabetes mellitus without complication, without long-term current use of insulin (HCC) - POCT HgB A1C is 6.5 which is stable from last visit. May continue rybelsus and working on diet and exercise  2. Essential hypertension Borderline in office however patient is not yet taken her medication today and has been well controlled at home.  Continue current medication  3. Hypersomnia Has HST scheduled for November   General Counseling: Allee verbalizes understanding of the findings of todays visit and agrees with plan of treatment. I have discussed any further diagnostic evaluation that may be needed or ordered today. We also reviewed her medications today. she has been encouraged to call the office with any questions or concerns that should arise related to todays visit.    Orders Placed This Encounter  Procedures   POCT HgB A1C    No orders of the  defined types were placed in this encounter.   This patient was seen by Drema Dallas, PA-C in collaboration with Dr. Clayborn Bigness as a part of collaborative care agreement.   Total time spent:30 Minutes Time spent includes review of chart, medications, test results, and follow up plan with the patient.      Dr Lavera Guise Internal medicine

## 2022-01-18 ENCOUNTER — Ambulatory Visit: Payer: 59 | Admitting: Physician Assistant

## 2022-02-12 ENCOUNTER — Ambulatory Visit: Payer: 59 | Admitting: Physician Assistant

## 2022-03-10 DIAGNOSIS — Z9889 Other specified postprocedural states: Secondary | ICD-10-CM | POA: Diagnosis not present

## 2022-03-12 ENCOUNTER — Encounter: Payer: Self-pay | Admitting: Physician Assistant

## 2022-03-12 ENCOUNTER — Ambulatory Visit: Payer: 59 | Admitting: Physician Assistant

## 2022-03-12 VITALS — BP 159/102 | HR 75 | Temp 98.4°F | Resp 16 | Ht 68.5 in | Wt 292.0 lb

## 2022-03-12 NOTE — Progress Notes (Unsigned)
[facility] [place], [place] [address]  Internal MEDICINE  Office Visit Note  Patient Name: Destiny Singh  [id number]  [id number]  Date of Service: 03/12/2022  Chief Complaint  Patient presents with   Follow-up    Review SS   Diabetes   Hypertension   Quality Metric Gaps    Eye Exam    HPI     Current Medication: Outpatient Encounter Medications as of 03/12/2022  Medication Sig   Ashwagandha 500 MG CAPS Take 500 mg by mouth daily as needed (energy).   cholecalciferol (VITAMIN D3) 25 MCG (1000 UNIT) tablet Take 1,000 Units by mouth daily.   cyanocobalamin (,VITAMIN B-12,) 1000 MCG/ML injection Inject 1,000 mcg into the muscle See admin instructions. Every 2 months   ibuprofen (ADVIL) 800 MG tablet Take 1 tablet (800 mg total) by mouth every 8 (eight) hours as needed for mild pain.   losartan-hydrochlorothiazide (HYZAAR) 50-12.5 MG tablet Take 1 tablet by mouth daily.   metoprolol tartrate (LOPRESSOR) 50 MG tablet TAKE 1 TABLET BY MOUTH TWICE A [place]   ondansetron (ZOFRAN) 4 MG tablet Take 1 tablet (4 mg total) by mouth every 8 (eight) hours as needed for nausea or vomiting.   Semaglutide (RYBELSUS) 3 MG TABS Take 3 mg by mouth daily.   No facility-administered encounter medications on file as of 03/12/2022.    Surgical History: Past Surgical History:  Procedure Laterality Date   BREAST EXCISIONAL BIOPSY     in teens per pt   BUNIONECTOMY Right 10/01/2021   Procedure: BUNIONECTOMY - LAPIDUS - TYPE;  Surgeon: Caroline More, DPM;  Location: [place];  Service: Podiatry;  Laterality: Right;   CALCANEAL OSTEOTOMY Right 10/01/2021   Procedure: EVANS/MCDO;  Surgeon: Caroline More, DPM;  Location: [place];  Service: Podiatry;  Laterality: Right;   CHONDROPLASTY Right 04/01/2016   Procedure: CHONDROPLASTY -ABRASION OF FEMORAL TROCHLEA, [place];  Surgeon: Corky Mull, MD;  Location: [facility];  Service: Orthopedics;   Laterality: Right;   GASTROC RECESSION EXTREMITY Right 10/01/2021   Procedure: GASTROC RECESSION;  Surgeon: Caroline More, DPM;  Location: [place];  Service: Podiatry;  Laterality: Right;   KNEE ARTHROSCOPY Left 06/18/2020   Procedure: LEFT KNEE ARTHROSCOPY WITH DEBRIDEMENT AND PARTIAL MEDIAL MENISCECTOMY;  Surgeon: Corky Mull, MD;  Location: [facility];  Service: Orthopedics;  Laterality: Left;   KNEE ARTHROSCOPY WITH MENISCAL REPAIR Right 04/01/2016   Procedure: KNEE ARTHROSCOPY WITH PARTIAL MEDIAL  MENISCAL REPAIR;  Surgeon: Corky Mull, MD;  Location: [facility];  Service: Orthopedics;  Laterality: Right;   TENDON TRANSFER Right 10/01/2021   Procedure: FDL TRANSFER; DEEP;  Surgeon: Caroline More, DPM;  Location: [place];  Service: Podiatry;  Laterality: Right;   TENOTOMY / FLEXOR TENDON TRANSFER Right 10/01/2021   Procedure: FLEXOR TENDON REPAIR;  Surgeon: Caroline More, DPM;  Location: [place];  Service: Podiatry;  Laterality: Right;   TUBAL LIGATION     TUMOR EXCISION  2016   BENIGN-HEAD    Medical History: Past Medical History:  Diagnosis Date   Adopted    Arthritis    BOTH KNEES   Asthma    Benign tumor    Diabetes mellitus without complication (HCC)    Dyspnea    VERY RARE   Dysrhythmia    IRREGULAR PER PT   Headache    Hypertension    Myocardial infarction ([place])    AGE 52    Family History:  Family History  Adopted: Yes    Social History   Socioeconomic History   Marital status: Single    Spouse name: Not on file   Number of children: Not on file   Years of education: Not on file   Highest education level: Not on file  Occupational History   Not on file  Tobacco Use   Smoking status: Never   Smokeless tobacco: Never  Substance and Sexual Activity   Alcohol use: No   Drug use: No   Sexual activity: Not on file  Other Topics Concern   Not on file  Social History Narrative   Not on file   Social Determinants of  Health   Financial Resource Strain: Not on file  Food Insecurity: Not on file  Transportation Needs: Not on file  Physical Activity: Not on file  Stress: Not on file  Social Connections: Not on file  Intimate Partner Violence: Not on file      Review of Systems  Vital Signs: BP (!) 159/102   Pulse 75   Temp 98.4 F (36.9 C)   Resp 16   Ht 5' 8.5" (1.74 m)   Wt 292 lb (132.5 kg)   LMP 11/23/2018 (Approximate)   SpO2 97%   BMI 43.75 kg/m    Physical Exam     Assessment/Plan:   General Counseling: Carita verbalizes understanding of the findings of todays visit and agrees with plan of treatment. I have discussed any further diagnostic evaluation that may be needed or ordered today. We also reviewed her medications today. she has been encouraged to call the office with any questions or concerns that should arise related to todays visit.    Orders Placed This Encounter  Procedures   Urine Microalbumin w/creat. ratio    No orders of the defined types were placed in this encounter.   This patient was seen by Drema Dallas, PA-C in collaboration with Dr. Clayborn Bigness as a part of collaborative care agreement.   Total time spent:*** Minutes Time spent includes review of chart, medications, test results, and follow up plan with the patient.      Dr Lavera Guise Internal medicine

## 2022-03-15 ENCOUNTER — Ambulatory Visit: Payer: 59 | Admitting: Physician Assistant

## 2022-03-22 DIAGNOSIS — G5781 Other specified mononeuropathies of right lower limb: Secondary | ICD-10-CM | POA: Diagnosis not present

## 2022-03-22 DIAGNOSIS — M2142 Flat foot [pes planus] (acquired), left foot: Secondary | ICD-10-CM | POA: Diagnosis not present

## 2022-03-22 DIAGNOSIS — M216X1 Other acquired deformities of right foot: Secondary | ICD-10-CM | POA: Diagnosis not present

## 2022-03-22 DIAGNOSIS — E119 Type 2 diabetes mellitus without complications: Secondary | ICD-10-CM | POA: Diagnosis not present

## 2022-03-22 DIAGNOSIS — M79671 Pain in right foot: Secondary | ICD-10-CM | POA: Diagnosis not present

## 2022-03-22 DIAGNOSIS — M2141 Flat foot [pes planus] (acquired), right foot: Secondary | ICD-10-CM | POA: Diagnosis not present

## 2022-03-22 DIAGNOSIS — M76821 Posterior tibial tendinitis, right leg: Secondary | ICD-10-CM | POA: Diagnosis not present

## 2022-03-22 DIAGNOSIS — M2011 Hallux valgus (acquired), right foot: Secondary | ICD-10-CM | POA: Diagnosis not present

## 2022-03-22 DIAGNOSIS — M216X2 Other acquired deformities of left foot: Secondary | ICD-10-CM | POA: Diagnosis not present

## 2022-03-22 DIAGNOSIS — M249 Joint derangement, unspecified: Secondary | ICD-10-CM | POA: Diagnosis not present

## 2022-03-22 DIAGNOSIS — Z09 Encounter for follow-up examination after completed treatment for conditions other than malignant neoplasm: Secondary | ICD-10-CM | POA: Diagnosis not present

## 2022-03-22 DIAGNOSIS — M7671 Peroneal tendinitis, right leg: Secondary | ICD-10-CM | POA: Diagnosis not present

## 2022-03-26 DIAGNOSIS — Z9889 Other specified postprocedural states: Secondary | ICD-10-CM | POA: Diagnosis not present

## 2022-04-06 ENCOUNTER — Other Ambulatory Visit: Payer: Self-pay | Admitting: Podiatry

## 2022-04-06 DIAGNOSIS — M7671 Peroneal tendinitis, right leg: Secondary | ICD-10-CM

## 2022-04-06 DIAGNOSIS — G5781 Other specified mononeuropathies of right lower limb: Secondary | ICD-10-CM

## 2022-04-08 ENCOUNTER — Ambulatory Visit: Payer: 59 | Admitting: Physician Assistant

## 2022-04-12 DIAGNOSIS — M2142 Flat foot [pes planus] (acquired), left foot: Secondary | ICD-10-CM | POA: Diagnosis not present

## 2022-04-12 DIAGNOSIS — E119 Type 2 diabetes mellitus without complications: Secondary | ICD-10-CM | POA: Diagnosis not present

## 2022-04-12 DIAGNOSIS — Z09 Encounter for follow-up examination after completed treatment for conditions other than malignant neoplasm: Secondary | ICD-10-CM | POA: Diagnosis not present

## 2022-04-12 DIAGNOSIS — M2141 Flat foot [pes planus] (acquired), right foot: Secondary | ICD-10-CM | POA: Diagnosis not present

## 2022-04-12 DIAGNOSIS — M216X1 Other acquired deformities of right foot: Secondary | ICD-10-CM | POA: Diagnosis not present

## 2022-04-12 DIAGNOSIS — M249 Joint derangement, unspecified: Secondary | ICD-10-CM | POA: Diagnosis not present

## 2022-04-12 DIAGNOSIS — M2011 Hallux valgus (acquired), right foot: Secondary | ICD-10-CM | POA: Diagnosis not present

## 2022-04-12 DIAGNOSIS — M216X2 Other acquired deformities of left foot: Secondary | ICD-10-CM | POA: Diagnosis not present

## 2022-04-12 DIAGNOSIS — M76821 Posterior tibial tendinitis, right leg: Secondary | ICD-10-CM | POA: Diagnosis not present

## 2022-04-12 DIAGNOSIS — M79671 Pain in right foot: Secondary | ICD-10-CM | POA: Diagnosis not present

## 2022-04-12 DIAGNOSIS — G5781 Other specified mononeuropathies of right lower limb: Secondary | ICD-10-CM | POA: Diagnosis not present

## 2022-04-12 DIAGNOSIS — M7671 Peroneal tendinitis, right leg: Secondary | ICD-10-CM | POA: Diagnosis not present

## 2022-04-13 ENCOUNTER — Ambulatory Visit: Payer: Self-pay | Admitting: Dermatology

## 2022-04-15 DIAGNOSIS — Z9889 Other specified postprocedural states: Secondary | ICD-10-CM | POA: Diagnosis not present

## 2022-04-19 ENCOUNTER — Encounter: Payer: Self-pay | Admitting: Physician Assistant

## 2022-04-19 ENCOUNTER — Ambulatory Visit (INDEPENDENT_AMBULATORY_CARE_PROVIDER_SITE_OTHER): Payer: 59 | Admitting: Physician Assistant

## 2022-04-19 ENCOUNTER — Other Ambulatory Visit: Payer: Self-pay | Admitting: Physician Assistant

## 2022-04-19 VITALS — BP 171/97 | HR 88 | Temp 98.0°F | Resp 16 | Ht 68.5 in | Wt 287.6 lb

## 2022-04-19 DIAGNOSIS — G4733 Obstructive sleep apnea (adult) (pediatric): Secondary | ICD-10-CM

## 2022-04-19 DIAGNOSIS — E119 Type 2 diabetes mellitus without complications: Secondary | ICD-10-CM | POA: Diagnosis not present

## 2022-04-19 DIAGNOSIS — I10 Essential (primary) hypertension: Secondary | ICD-10-CM | POA: Diagnosis not present

## 2022-04-19 LAB — POCT GLYCOSYLATED HEMOGLOBIN (HGB A1C): Hemoglobin A1C: 6.6 % — AB (ref 4.0–5.6)

## 2022-04-19 MED ORDER — METOPROLOL TARTRATE 50 MG PO TABS
50.0000 mg | ORAL_TABLET | Freq: Two times a day (BID) | ORAL | 3 refills | Status: DC
Start: 2022-04-19 — End: 2022-06-28

## 2022-04-19 MED ORDER — TIRZEPATIDE 2.5 MG/0.5ML ~~LOC~~ SOAJ: 2.5000 mg | SUBCUTANEOUS | 0 refills | Status: DC

## 2022-04-19 MED ORDER — LOSARTAN POTASSIUM-HCTZ 50-12.5 MG PO TABS: 1.0000 | ORAL_TABLET | Freq: Every day | ORAL | 3 refills | Status: DC

## 2022-04-19 NOTE — Telephone Encounter (Signed)
Mounjaro not covered

## 2022-04-19 NOTE — Progress Notes (Signed)
[facility] [place], [place] [address]  Internal MEDICINE  Office Visit Note  Patient Name: Destiny Singh  [id number]  [id number]  Date of Service: 05/03/2022  Chief Complaint  Patient presents with   Follow-up    Patient completed sleep study   Diabetes   Hypertension   Quality Metric Gaps    Eye Exam    HPI Pt is here for routine follow up and to review sleep study -Just got off work and hasn't taken BP meds yet, also thinks she needs refills -Reviewed HST showing severe OSA with REI of 95 and avg oxygen of 87%, with lowest to 64%. Will follow up regarding machine and discussed severity requiring treatment -Would like to try mounjaro, if not covered then could try alternative. Has not had rybelsus. Has previously tried metformin and had S/E -BP did not improve on recheck, due to take meds -Will start checking BP -She is on gabapentin '100mg'$  TID and meloxicam '15mg'$  qd as needed from podiatry  Current Medication: Outpatient Encounter Medications as of 04/19/2022  Medication Sig   Ashwagandha 500 MG CAPS Take 500 mg by mouth daily as needed (energy).   cholecalciferol (VITAMIN D3) 25 MCG (1000 UNIT) tablet Take 1,000 Units by mouth daily.   cyanocobalamin (,VITAMIN B-12,) 1000 MCG/ML injection Inject 1,000 mcg into the muscle See admin instructions. Every 2 months   ibuprofen (ADVIL) 800 MG tablet Take 1 tablet (800 mg total) by mouth every 8 (eight) hours as needed for mild pain.   ondansetron (ZOFRAN) 4 MG tablet Take 1 tablet (4 mg total) by mouth every 8 (eight) hours as needed for nausea or vomiting.   [DISCONTINUED] losartan-hydrochlorothiazide (HYZAAR) 50-12.5 MG tablet Take 1 tablet by mouth daily.   [DISCONTINUED] metoprolol tartrate (LOPRESSOR) 50 MG tablet TAKE 1 TABLET BY MOUTH TWICE A [place]   [DISCONTINUED] Semaglutide (RYBELSUS) 3 MG TABS Take 3 mg by mouth daily.   [DISCONTINUED] tirzepatide [facility]) 2.5 MG/0.5ML Pen Inject 2.5 mg into the  skin once a week.   losartan-hydrochlorothiazide (HYZAAR) 50-12.5 MG tablet Take 1 tablet by mouth daily.   metoprolol tartrate (LOPRESSOR) 50 MG tablet Take 1 tablet (50 mg total) by mouth 2 (two) times daily.   No facility-administered encounter medications on file as of 04/19/2022.    Surgical History: Past Surgical History:  Procedure Laterality Date   BREAST EXCISIONAL BIOPSY     in teens per pt   BUNIONECTOMY Right 10/01/2021   Procedure: BUNIONECTOMY - LAPIDUS - TYPE;  Surgeon: Caroline More, DPM;  Location: [place];  Service: Podiatry;  Laterality: Right;   CALCANEAL OSTEOTOMY Right 10/01/2021   Procedure: EVANS/MCDO;  Surgeon: Caroline More, DPM;  Location: [place];  Service: Podiatry;  Laterality: Right;   CHONDROPLASTY Right 04/01/2016   Procedure: CHONDROPLASTY -ABRASION OF FEMORAL TROCHLEA, [place];  Surgeon: Corky Mull, MD;  Location: [facility];  Service: Orthopedics;  Laterality: Right;   GASTROC RECESSION EXTREMITY Right 10/01/2021   Procedure: GASTROC RECESSION;  Surgeon: Caroline More, DPM;  Location: [place];  Service: Podiatry;  Laterality: Right;   KNEE ARTHROSCOPY Left 06/18/2020   Procedure: LEFT KNEE ARTHROSCOPY WITH DEBRIDEMENT AND PARTIAL MEDIAL MENISCECTOMY;  Surgeon: Corky Mull, MD;  Location: [facility];  Service: Orthopedics;  Laterality: Left;   KNEE ARTHROSCOPY WITH MENISCAL REPAIR Right 04/01/2016   Procedure: KNEE ARTHROSCOPY WITH PARTIAL MEDIAL  MENISCAL REPAIR;  Surgeon: Corky Mull, MD;  Location: [facility];  Service:  Orthopedics;  Laterality: Right;   TENDON TRANSFER Right 10/01/2021   Procedure: FDL TRANSFER; DEEP;  Surgeon: Caroline More, DPM;  Location: [place];  Service: Podiatry;  Laterality: Right;   TENOTOMY / FLEXOR TENDON TRANSFER Right 10/01/2021   Procedure: FLEXOR TENDON REPAIR;  Surgeon: Caroline More, DPM;  Location: [place];  Service: Podiatry;  Laterality: Right;    TUBAL LIGATION     TUMOR EXCISION  2016   BENIGN-HEAD    Medical History: Past Medical History:  Diagnosis Date   Adopted    Arthritis    BOTH KNEES   Asthma    Benign tumor    Diabetes mellitus without complication (HCC)    Dyspnea    VERY RARE   Dysrhythmia    IRREGULAR PER PT   Headache    Hypertension    Myocardial infarction ([place])    AGE 52    Family History: Family History  Adopted: Yes    Social History   Socioeconomic History   Marital status: Single    Spouse name: Not on file   Number of children: Not on file   Years of education: Not on file   Highest education level: Not on file  Occupational History   Not on file  Tobacco Use   Smoking status: Never   Smokeless tobacco: Never  Substance and Sexual Activity   Alcohol use: No   Drug use: No   Sexual activity: Not on file  Other Topics Concern   Not on file  Social History Narrative   Not on file   Social Determinants of Health   Financial Resource Strain: Not on file  Food Insecurity: Not on file  Transportation Needs: Not on file  Physical Activity: Not on file  Stress: Not on file  Social Connections: Not on file  Intimate Partner Violence: Not on file      Review of Systems  Constitutional:  Positive for fatigue. Negative for chills and unexpected weight change.  HENT:  Negative for congestion, postnasal drip, rhinorrhea, sneezing and sore throat.   Eyes:  Negative for redness.  Respiratory:  Negative for cough, chest tightness and shortness of breath.   Cardiovascular:  Negative for chest pain and palpitations.  Gastrointestinal:  Negative for abdominal pain, constipation, diarrhea, nausea and vomiting.  Genitourinary:  Negative for dysuria and frequency.  Musculoskeletal:  Positive for arthralgias and gait problem. Negative for back pain, joint swelling and neck pain.  Skin:  Negative for rash.  Neurological:  Negative for tremors and numbness.  Hematological:  Negative for  adenopathy. Does not bruise/bleed easily.  Psychiatric/Behavioral:  Positive for sleep disturbance. Negative for behavioral problems (Depression) and suicidal ideas. The patient is not nervous/anxious.     Vital Signs: BP (!) 171/97   Pulse 88   Temp 98 F (36.7 C)   Resp 16   Ht 5' 8.5" (1.74 m)   Wt 287 lb 9.6 oz (130.5 kg)   LMP 11/23/2018 (Approximate)   SpO2 91%   BMI 43.09 kg/m    Physical Exam Vitals and nursing note reviewed.  Constitutional:      Appearance: Normal appearance.  HENT:     Head: Normocephalic and atraumatic.     Nose: Nose normal.     Mouth/Throat:     Mouth: Mucous membranes are moist.     Pharynx: No posterior oropharyngeal erythema.  Eyes:     Extraocular Movements: Extraocular movements intact.     Pupils: Pupils are equal,  round, and reactive to light.  Cardiovascular:     Rate and Rhythm: Normal rate and regular rhythm.     Pulses: Normal pulses.     Heart sounds: Normal heart sounds.  Pulmonary:     Effort: Pulmonary effort is normal.     Breath sounds: Normal breath sounds.  Abdominal:     Tenderness: There is no abdominal tenderness.  Skin:    General: Skin is warm and dry.  Neurological:     General: No focal deficit present.     Mental Status: She is alert.  Psychiatric:        Mood and Affect: Mood normal.        Behavior: Behavior normal.        Assessment/Plan: 1. Type 2 diabetes mellitus without complication, without long-term current use of insulin (HCC) - POCT HgB A1C is 6.6 which is slightly worse from 6.5 last visit. Will try sending mounjaro or alternative if not covered. Continue to work on diet and exercise  2. Essential hypertension Elevated in office, but has not had medications yet as she just got off work and is in need of refills. Will start monitoring closely at home. - losartan-hydrochlorothiazide (HYZAAR) 50-12.5 MG tablet; Take 1 tablet by mouth daily.  Dispense: 90 tablet; Refill: 3 - metoprolol  tartrate (LOPRESSOR) 50 MG tablet; Take 1 tablet (50 mg total) by mouth 2 (two) times daily.  Dispense: 60 tablet; Refill: 3  3. OSA (obstructive sleep apnea) Will need to move forward with CPAP as soon as able   General Counseling: Marianela verbalizes understanding of the findings of todays visit and agrees with plan of treatment. I have discussed any further diagnostic evaluation that may be needed or ordered today. We also reviewed her medications today. she has been encouraged to call the office with any questions or concerns that should arise related to todays visit.    Orders Placed This Encounter  Procedures   POCT HgB A1C    Meds ordered this encounter  Medications   DISCONTD: tirzepatide (MOUNJARO) 2.5 MG/0.5ML Pen    Sig: Inject 2.5 mg into the skin once a week.    Dispense:  2 mL    Refill:  0   losartan-hydrochlorothiazide (HYZAAR) 50-12.5 MG tablet    Sig: Take 1 tablet by mouth daily.    Dispense:  90 tablet    Refill:  3   metoprolol tartrate (LOPRESSOR) 50 MG tablet    Sig: Take 1 tablet (50 mg total) by mouth 2 (two) times daily.    Dispense:  60 tablet    Refill:  3    This patient was seen by Drema Dallas, PA-C in collaboration with Dr. Clayborn Bigness as a part of collaborative care agreement.   Total time spent:30 Minutes Time spent includes review of chart, medications, test results, and follow up plan with the patient.      Dr Lavera Guise Internal medicine

## 2022-04-23 ENCOUNTER — Ambulatory Visit
Admission: RE | Admit: 2022-04-23 | Discharge: 2022-04-23 | Disposition: A | Discharge status: Home / Self Care | Payer: 59 | Source: Ambulatory Visit | Attending: Podiatry | Admitting: Podiatry

## 2022-04-23 DIAGNOSIS — M19071 Primary osteoarthritis, right ankle and foot: Secondary | ICD-10-CM | POA: Diagnosis not present

## 2022-04-23 DIAGNOSIS — G5781 Other specified mononeuropathies of right lower limb: Secondary | ICD-10-CM

## 2022-04-23 DIAGNOSIS — M7671 Peroneal tendinitis, right leg: Secondary | ICD-10-CM

## 2022-04-23 DIAGNOSIS — Z981 Arthrodesis status: Secondary | ICD-10-CM | POA: Diagnosis not present

## 2022-04-29 DIAGNOSIS — M79671 Pain in right foot: Secondary | ICD-10-CM | POA: Diagnosis not present

## 2022-04-29 DIAGNOSIS — Z9889 Other specified postprocedural states: Secondary | ICD-10-CM | POA: Diagnosis not present

## 2022-05-07 DIAGNOSIS — M9689 Other intraoperative and postprocedural complications and disorders of the musculoskeletal system: Secondary | ICD-10-CM | POA: Diagnosis not present

## 2022-05-07 DIAGNOSIS — E119 Type 2 diabetes mellitus without complications: Secondary | ICD-10-CM | POA: Diagnosis not present

## 2022-05-07 DIAGNOSIS — S92001K Unspecified fracture of right calcaneus, subsequent encounter for fracture with nonunion: Secondary | ICD-10-CM | POA: Diagnosis not present

## 2022-05-12 DIAGNOSIS — M722 Plantar fascial fibromatosis: Secondary | ICD-10-CM | POA: Diagnosis not present

## 2022-05-12 DIAGNOSIS — M2141 Flat foot [pes planus] (acquired), right foot: Secondary | ICD-10-CM | POA: Diagnosis not present

## 2022-05-12 DIAGNOSIS — M2142 Flat foot [pes planus] (acquired), left foot: Secondary | ICD-10-CM | POA: Diagnosis not present

## 2022-05-12 DIAGNOSIS — M79671 Pain in right foot: Secondary | ICD-10-CM | POA: Diagnosis not present

## 2022-05-12 DIAGNOSIS — G8929 Other chronic pain: Secondary | ICD-10-CM | POA: Diagnosis not present

## 2022-05-12 DIAGNOSIS — M19071 Primary osteoarthritis, right ankle and foot: Secondary | ICD-10-CM | POA: Diagnosis not present

## 2022-05-12 DIAGNOSIS — M9689 Other intraoperative and postprocedural complications and disorders of the musculoskeletal system: Secondary | ICD-10-CM | POA: Diagnosis not present

## 2022-05-12 DIAGNOSIS — M216X2 Other acquired deformities of left foot: Secondary | ICD-10-CM | POA: Diagnosis not present

## 2022-05-12 DIAGNOSIS — M249 Joint derangement, unspecified: Secondary | ICD-10-CM | POA: Diagnosis not present

## 2022-05-12 DIAGNOSIS — M25571 Pain in right ankle and joints of right foot: Secondary | ICD-10-CM | POA: Diagnosis not present

## 2022-05-12 DIAGNOSIS — M216X1 Other acquired deformities of right foot: Secondary | ICD-10-CM | POA: Diagnosis not present

## 2022-05-14 DIAGNOSIS — Z9889 Other specified postprocedural states: Secondary | ICD-10-CM | POA: Diagnosis not present

## 2022-05-19 DIAGNOSIS — Z9889 Other specified postprocedural states: Secondary | ICD-10-CM | POA: Diagnosis not present

## 2022-05-20 ENCOUNTER — Ambulatory Visit: Payer: 59 | Admitting: Physician Assistant

## 2022-05-24 ENCOUNTER — Telehealth: Payer: Self-pay | Admitting: Physician Assistant

## 2022-05-24 NOTE — Telephone Encounter (Signed)
83 pages of MR faxed to [facility]; [phone]

## 2022-05-26 ENCOUNTER — Telehealth: Payer: Self-pay | Admitting: Internal Medicine

## 2022-05-26 NOTE — Telephone Encounter (Signed)
Per [facility] they have tried to contact her on 1/25 & 1/26 to schedule cpap titration.They were advised that she will call back to schedule once she checks her work schedule.tat

## 2022-05-27 DIAGNOSIS — Z9889 Other specified postprocedural states: Secondary | ICD-10-CM | POA: Diagnosis not present

## 2022-05-31 ENCOUNTER — Encounter: Payer: Self-pay | Admitting: Physician Assistant

## 2022-05-31 ENCOUNTER — Ambulatory Visit: Payer: 59 | Admitting: Physician Assistant

## 2022-05-31 VITALS — BP 162/104 | HR 70 | Temp 98.4°F | Resp 16 | Ht 68.5 in | Wt 287.0 lb

## 2022-05-31 DIAGNOSIS — I10 Essential (primary) hypertension: Secondary | ICD-10-CM

## 2022-05-31 DIAGNOSIS — G4733 Obstructive sleep apnea (adult) (pediatric): Secondary | ICD-10-CM | POA: Diagnosis not present

## 2022-05-31 DIAGNOSIS — E119 Type 2 diabetes mellitus without complications: Secondary | ICD-10-CM

## 2022-05-31 MED ORDER — TRULICITY 0.75 MG/0.5ML ~~LOC~~ SOAJ: 0.7500 mg | SUBCUTANEOUS | 0 refills | Status: DC

## 2022-05-31 NOTE — Progress Notes (Signed)
[facility] [place], [place] [address]  Internal MEDICINE  Office Visit Note  Patient Name: Destiny Singh  [id number]  [id number]  Date of Service: 05/31/2022  Chief Complaint  Patient presents with   Diabetes   Hypertension   Follow-up    HPI Pt is here for routine follow up -Did buy BP cuff, but hasn't really been checking.  -will increase to 1.5tabs hyzaar, she is due to take meds now. Discussed checking BP at home/work and bring cuff next visit as well to compare. Try to schedule appt for after BP meds have been taken, however this is difficult with work schedule. -Gabapentin started by podiatry -Figuring out expenses for cpap currently, again discussed importance of this -Called her insurance about diabetes medication and was told trulicity preferred, but additionally info will be needed--likely PA and will switch this as nothing heard about mounjaro  Current Medication: Outpatient Encounter Medications as of 05/31/2022  Medication Sig   Ashwagandha 500 MG CAPS Take 500 mg by mouth daily as needed (energy).   cholecalciferol (VITAMIN D3) 25 MCG (1000 UNIT) tablet Take 1,000 Units by mouth daily.   cyanocobalamin (,VITAMIN B-12,) 1000 MCG/ML injection Inject 1,000 mcg into the muscle See admin instructions. Every 2 months   Dulaglutide (TRULICITY) A999333 0000000 SOPN Inject 0.75 mg into the skin once a week.   gabapentin (NEURONTIN) 100 MG capsule Take 100 mg by mouth 3 (three) times daily.   ibuprofen (ADVIL) 800 MG tablet Take 1 tablet (800 mg total) by mouth every 8 (eight) hours as needed for mild pain.   losartan-hydrochlorothiazide (HYZAAR) 50-12.5 MG tablet Take 1 tablet by mouth daily.   metoprolol tartrate (LOPRESSOR) 50 MG tablet Take 1 tablet (50 mg total) by mouth 2 (two) times daily.   ondansetron (ZOFRAN) 4 MG tablet Take 1 tablet (4 mg total) by mouth every 8 (eight) hours as needed for nausea or vomiting.   No facility-administered  encounter medications on file as of 05/31/2022.    Surgical History: Past Surgical History:  Procedure Laterality Date   BREAST EXCISIONAL BIOPSY     in teens per pt   BUNIONECTOMY Right 10/01/2021   Procedure: BUNIONECTOMY - LAPIDUS - TYPE;  Surgeon: Caroline More, DPM;  Location: [place];  Service: Podiatry;  Laterality: Right;   CALCANEAL OSTEOTOMY Right 10/01/2021   Procedure: EVANS/MCDO;  Surgeon: Caroline More, DPM;  Location: [place];  Service: Podiatry;  Laterality: Right;   CHONDROPLASTY Right 04/01/2016   Procedure: CHONDROPLASTY -ABRASION OF FEMORAL TROCHLEA, [place];  Surgeon: Corky Mull, MD;  Location: [facility];  Service: Orthopedics;  Laterality: Right;   GASTROC RECESSION EXTREMITY Right 10/01/2021   Procedure: GASTROC RECESSION;  Surgeon: Caroline More, DPM;  Location: [place];  Service: Podiatry;  Laterality: Right;   KNEE ARTHROSCOPY Left 06/18/2020   Procedure: LEFT KNEE ARTHROSCOPY WITH DEBRIDEMENT AND PARTIAL MEDIAL MENISCECTOMY;  Surgeon: Corky Mull, MD;  Location: [facility];  Service: Orthopedics;  Laterality: Left;   KNEE ARTHROSCOPY WITH MENISCAL REPAIR Right 04/01/2016   Procedure: KNEE ARTHROSCOPY WITH PARTIAL MEDIAL  MENISCAL REPAIR;  Surgeon: Corky Mull, MD;  Location: [facility];  Service: Orthopedics;  Laterality: Right;   TENDON TRANSFER Right 10/01/2021   Procedure: FDL TRANSFER; DEEP;  Surgeon: Caroline More, DPM;  Location: [place];  Service: Podiatry;  Laterality: Right;   TENOTOMY / FLEXOR TENDON TRANSFER Right 10/01/2021   Procedure: FLEXOR TENDON REPAIR;  Surgeon: Luana Shu,  Mitzi Hansen, DPM;  Location: [place];  Service: Podiatry;  Laterality: Right;   TUBAL LIGATION     TUMOR EXCISION  2016   BENIGN-HEAD    Medical History: Past Medical History:  Diagnosis Date   Adopted    Arthritis    BOTH KNEES   Asthma    Benign tumor    Diabetes mellitus without complication (HCC)     Dyspnea    VERY RARE   Dysrhythmia    IRREGULAR PER PT   Headache    Hypertension    Myocardial infarction ([place])    AGE 52    Family History: Family History  Adopted: Yes    Social History   Socioeconomic History   Marital status: Single    Spouse name: Not on file   Number of children: Not on file   Years of education: Not on file   Highest education level: Not on file  Occupational History   Not on file  Tobacco Use   Smoking status: Never   Smokeless tobacco: Never  Substance and Sexual Activity   Alcohol use: No   Drug use: No   Sexual activity: Not on file  Other Topics Concern   Not on file  Social History Narrative   Not on file   Social Determinants of Health   Financial Resource Strain: Not on file  Food Insecurity: Not on file  Transportation Needs: Not on file  Physical Activity: Not on file  Stress: Not on file  Social Connections: Not on file  Intimate Partner Violence: Not on file      Review of Systems  Constitutional:  Positive for fatigue. Negative for chills and unexpected weight change.  HENT:  Negative for congestion, postnasal drip, rhinorrhea, sneezing and sore throat.   Eyes:  Negative for redness.  Respiratory:  Negative for cough, chest tightness and shortness of breath.   Cardiovascular:  Negative for chest pain and palpitations.  Gastrointestinal:  Negative for abdominal pain, constipation, diarrhea, nausea and vomiting.  Genitourinary:  Negative for dysuria and frequency.  Musculoskeletal:  Positive for arthralgias and gait problem. Negative for back pain, joint swelling and neck pain.  Skin:  Negative for rash.  Neurological:  Negative for tremors and numbness.  Hematological:  Negative for adenopathy. Does not bruise/bleed easily.  Psychiatric/Behavioral:  Positive for sleep disturbance. Negative for behavioral problems (Depression) and suicidal ideas. The patient is not nervous/anxious.     Vital Signs: BP (!) 162/104  Comment: 175/98  Pulse 70   Temp 98.4 F (36.9 C)   Resp 16   Ht 5' 8.5" (1.74 m)   Wt 287 lb (130.2 kg)   LMP 11/23/2018 (Approximate)   SpO2 99%   BMI 43.00 kg/m    Physical Exam Vitals and nursing note reviewed.  Constitutional:      Appearance: Normal appearance.  HENT:     Head: Normocephalic and atraumatic.     Nose: Nose normal.     Mouth/Throat:     Mouth: Mucous membranes are moist.     Pharynx: No posterior oropharyngeal erythema.  Eyes:     Extraocular Movements: Extraocular movements intact.     Pupils: Pupils are equal, round, and reactive to light.  Cardiovascular:     Rate and Rhythm: Normal rate and regular rhythm.     Pulses: Normal pulses.     Heart sounds: Normal heart sounds.  Pulmonary:     Effort: Pulmonary effort is normal.     Breath  sounds: Normal breath sounds.  Abdominal:     Tenderness: There is no abdominal tenderness.  Skin:    General: Skin is warm and dry.  Neurological:     General: No focal deficit present.     Mental Status: She is alert.  Psychiatric:        Mood and Affect: Mood normal.        Behavior: Behavior normal.        Assessment/Plan: 1. Essential hypertension Will increase to 1.5 tabs hyzaar and continue metoprolol BID. Advised to monitor daily and bring cuff next visit.  2. Type 2 diabetes mellitus without complication, without long-term current use of insulin ([place]) Will try sending trulicity as preferred by insurance per patient - Dulaglutide (TRULICITY) A999333 0000000 SOPN; Inject 0.75 mg into the skin once a week.  Dispense: 2 mL; Refill: 0  3. OSA (obstructive sleep apnea) Encouraged to move forward with cpap   General Counseling: Shatoya verbalizes understanding of the findings of todays visit and agrees with plan of treatment. I have discussed any further diagnostic evaluation that may be needed or ordered today. We also reviewed her medications today. she has been encouraged to call the office with any  questions or concerns that should arise related to todays visit.    No orders of the defined types were placed in this encounter.   Meds ordered this encounter  Medications   Dulaglutide (TRULICITY) A999333 0000000 SOPN    Sig: Inject 0.75 mg into the skin once a week.    Dispense:  2 mL    Refill:  0    This patient was seen by Drema Dallas, PA-C in collaboration with Dr. Clayborn Bigness as a part of collaborative care agreement.   Total time spent:30 Minutes Time spent includes review of chart, medications, test results, and follow up plan with the patient.      Dr Lavera Guise Internal medicine

## 2022-06-10 DIAGNOSIS — Z9889 Other specified postprocedural states: Secondary | ICD-10-CM | POA: Diagnosis not present

## 2022-06-16 DIAGNOSIS — Z9889 Other specified postprocedural states: Secondary | ICD-10-CM | POA: Diagnosis not present

## 2022-06-24 DIAGNOSIS — Z9889 Other specified postprocedural states: Secondary | ICD-10-CM | POA: Diagnosis not present

## 2022-06-25 DIAGNOSIS — G5781 Other specified mononeuropathies of right lower limb: Secondary | ICD-10-CM | POA: Diagnosis not present

## 2022-06-25 DIAGNOSIS — M19071 Primary osteoarthritis, right ankle and foot: Secondary | ICD-10-CM | POA: Diagnosis not present

## 2022-06-25 DIAGNOSIS — M2142 Flat foot [pes planus] (acquired), left foot: Secondary | ICD-10-CM | POA: Diagnosis not present

## 2022-06-25 DIAGNOSIS — M79671 Pain in right foot: Secondary | ICD-10-CM | POA: Diagnosis not present

## 2022-06-25 DIAGNOSIS — M2141 Flat foot [pes planus] (acquired), right foot: Secondary | ICD-10-CM | POA: Diagnosis not present

## 2022-06-25 DIAGNOSIS — M216X1 Other acquired deformities of right foot: Secondary | ICD-10-CM | POA: Diagnosis not present

## 2022-06-25 DIAGNOSIS — M9689 Other intraoperative and postprocedural complications and disorders of the musculoskeletal system: Secondary | ICD-10-CM | POA: Diagnosis not present

## 2022-06-25 DIAGNOSIS — G8929 Other chronic pain: Secondary | ICD-10-CM | POA: Diagnosis not present

## 2022-06-25 DIAGNOSIS — M216X2 Other acquired deformities of left foot: Secondary | ICD-10-CM | POA: Diagnosis not present

## 2022-06-28 ENCOUNTER — Encounter: Payer: Self-pay | Admitting: Physician Assistant

## 2022-06-28 ENCOUNTER — Ambulatory Visit (INDEPENDENT_AMBULATORY_CARE_PROVIDER_SITE_OTHER): Payer: 59 | Admitting: Physician Assistant

## 2022-06-28 VITALS — BP 185/103 | HR 75 | Temp 97.8°F | Resp 16 | Ht 68.5 in | Wt 287.0 lb

## 2022-06-28 DIAGNOSIS — G4733 Obstructive sleep apnea (adult) (pediatric): Secondary | ICD-10-CM

## 2022-06-28 DIAGNOSIS — E119 Type 2 diabetes mellitus without complications: Secondary | ICD-10-CM | POA: Diagnosis not present

## 2022-06-28 DIAGNOSIS — I10 Essential (primary) hypertension: Secondary | ICD-10-CM

## 2022-06-28 MED ORDER — LOSARTAN POTASSIUM-HCTZ 100-25 MG PO TABS: 1.0000 | ORAL_TABLET | Freq: Every day | ORAL | 3 refills | Status: DC

## 2022-06-28 MED ORDER — METOPROLOL TARTRATE 50 MG PO TABS: 50.0000 mg | ORAL_TABLET | Freq: Two times a day (BID) | ORAL | 3 refills | Status: DC

## 2022-06-28 NOTE — Progress Notes (Signed)
[facility] [address] [place], [place] [address]  Internal MEDICINE  Office Visit Note  Patient Name: Destiny Singh  [id number]  [id number]  Date of Service: 06/28/2022  Chief Complaint  Patient presents with   Diabetes   Hypertension   Follow-up    HPI Pt is here for routine follow up -BP very elevated in office and did not improve on recheck -States she ran out of BP meds a week ago, had been taking 2tabs hyzaar and numbers improved but then ran out and figured she could wait until appt to ask about refills. Discussed importance of not waiting in future and calling if refill needed. -Taking 2 tabs metoprolol at once rather than BID and discussed trying BID dosing -When she gets off work her numbers would be 170s/70s before taking meds. Would recheck after meds in system 120s/60s  -Still working to get cpap  Current Medication: Outpatient Encounter Medications as of 06/28/2022  Medication Sig   Ashwagandha 500 MG CAPS Take 500 mg by mouth daily as needed (energy).   cholecalciferol (VITAMIN D3) 25 MCG (1000 UNIT) tablet Take 1,000 Units by mouth daily.   cyanocobalamin (,VITAMIN B-12,) 1000 MCG/ML injection Inject 1,000 mcg into the muscle See admin instructions. Every 2 months   Dulaglutide (TRULICITY) 0.75 MG/0.5ML SOPN Inject 0.75 mg into the skin once a week.   gabapentin (NEURONTIN) 100 MG capsule Take 100 mg by mouth 3 (three) times daily.   ibuprofen (ADVIL) 800 MG tablet Take 1 tablet (800 mg total) by mouth every 8 (eight) hours as needed for mild pain.   losartan-hydrochlorothiazide (HYZAAR) 100-25 MG tablet Take 1 tablet by mouth daily.   ondansetron (ZOFRAN) 4 MG tablet Take 1 tablet (4 mg total) by mouth every 8 (eight) hours as needed for nausea or vomiting.   [DISCONTINUED] losartan-hydrochlorothiazide (HYZAAR) 50-12.5 MG tablet Take 1 tablet by mouth daily.   [DISCONTINUED] metoprolol tartrate (LOPRESSOR) 50 MG tablet Take 1 tablet (50 mg total) by  mouth 2 (two) times daily.   metoprolol tartrate (LOPRESSOR) 50 MG tablet Take 1 tablet (50 mg total) by mouth 2 (two) times daily.   No facility-administered encounter medications on file as of 06/28/2022.    Surgical History: Past Surgical History:  Procedure Laterality Date   BREAST EXCISIONAL BIOPSY     in teens per pt   BUNIONECTOMY Right 10/01/2021   Procedure: BUNIONECTOMY - LAPIDUS - TYPE;  Surgeon: Rosetta Posner, DPM;  Location: [facility];  Service: Podiatry;  Laterality: Right;   CALCANEAL OSTEOTOMY Right 10/01/2021   Procedure: EVANS/MCDO;  Surgeon: Rosetta Posner, DPM;  Location: [facility];  Service: Podiatry;  Laterality: Right;   CHONDROPLASTY Right 04/01/2016   Procedure: CHONDROPLASTY -ABRASION OF FEMORAL TROCHLEA, LATERAL TIBIA [place];  Surgeon: Christena Flake, MD;  Location: [facility];  Service: Orthopedics;  Laterality: Right;   GASTROC RECESSION EXTREMITY Right 10/01/2021   Procedure: GASTROC RECESSION;  Surgeon: Rosetta Posner, DPM;  Location: [facility];  Service: Podiatry;  Laterality: Right;   KNEE ARTHROSCOPY Left 06/18/2020   Procedure: LEFT KNEE ARTHROSCOPY WITH DEBRIDEMENT AND PARTIAL MEDIAL MENISCECTOMY;  Surgeon: Christena Flake, MD;  Location: [facility];  Service: Orthopedics;  Laterality: Left;   KNEE ARTHROSCOPY WITH MENISCAL REPAIR Right 04/01/2016   Procedure: KNEE ARTHROSCOPY WITH PARTIAL MEDIAL  MENISCAL REPAIR;  Surgeon: Christena Flake, MD;  Location: [facility];  Service: Orthopedics;  Laterality: Right;   TENDON TRANSFER Right 10/01/2021   Procedure: FDL TRANSFER;  DEEP;  Surgeon: Rosetta Posner, DPM;  Location: [facility];  Service: Podiatry;  Laterality: Right;   TENOTOMY / FLEXOR TENDON TRANSFER Right 10/01/2021   Procedure: FLEXOR TENDON REPAIR;  Surgeon: Rosetta Posner, DPM;  Location: [facility];  Service: Podiatry;  Laterality: Right;   TUBAL LIGATION     TUMOR EXCISION  2016   BENIGN-HEAD    Medical  History: Past Medical History:  Diagnosis Date   Adopted    Arthritis    BOTH KNEES   Asthma    Benign tumor    Diabetes mellitus without complication    Dyspnea    VERY RARE   Dysrhythmia    IRREGULAR PER PT   Headache    Hypertension    Myocardial infarction    AGE 25    Family History: Family History  Adopted: Yes    Social History   Socioeconomic History   Marital status: Single    Spouse name: Not on file   Number of children: Not on file   Years of education: Not on file   Highest education level: Not on file  Occupational History   Not on file  Tobacco Use   Smoking status: Never   Smokeless tobacco: Never  Substance and Sexual Activity   Alcohol use: No   Drug use: No   Sexual activity: Not on file  Other Topics Concern   Not on file  Social History Narrative   Not on file   Social Determinants of Health   Financial Resource Strain: Not on file  Food Insecurity: Not on file  Transportation Needs: Not on file  Physical Activity: Not on file  Stress: Not on file  Social Connections: Not on file  Intimate Partner Violence: Not on file      Review of Systems  Constitutional:  Positive for fatigue. Negative for chills and unexpected weight change.  HENT:  Negative for congestion, postnasal drip, rhinorrhea, sneezing and sore throat.   Eyes:  Negative for redness.  Respiratory:  Negative for cough, chest tightness and shortness of breath.   Cardiovascular:  Negative for chest pain and palpitations.  Gastrointestinal:  Negative for abdominal pain, constipation, diarrhea, nausea and vomiting.  Genitourinary:  Negative for dysuria and frequency.  Musculoskeletal:  Positive for arthralgias and gait problem. Negative for back pain, joint swelling and neck pain.  Skin:  Negative for rash.  Neurological:  Negative for tremors and numbness.  Hematological:  Negative for adenopathy. Does not bruise/bleed easily.  Psychiatric/Behavioral:  Positive for  sleep disturbance. Negative for behavioral problems (Depression) and suicidal ideas. The patient is not nervous/anxious.     Vital Signs: BP (!) 185/103   Pulse 75   Temp 97.8 F (36.6 C)   Resp 16   Ht 5' 8.5" (1.74 m)   Wt 287 lb (130.2 kg)   LMP 11/23/2018 (Approximate)   SpO2 96%   BMI 43.00 kg/m    Physical Exam Vitals and nursing note reviewed.  Constitutional:      Appearance: Normal appearance.  HENT:     Head: Normocephalic and atraumatic.     Nose: Nose normal.     Mouth/Throat:     Mouth: Mucous membranes are moist.     Pharynx: No posterior oropharyngeal erythema.  Eyes:     Extraocular Movements: Extraocular movements intact.     Pupils: Pupils are equal, round, and reactive to light.  Cardiovascular:     Rate and Rhythm: Normal rate and regular rhythm.  Pulses: Normal pulses.     Heart sounds: Normal heart sounds.  Pulmonary:     Effort: Pulmonary effort is normal.     Breath sounds: Normal breath sounds.  Skin:    General: Skin is warm and dry.  Neurological:     General: No focal deficit present.     Mental Status: She is alert.  Psychiatric:        Mood and Affect: Mood normal.        Behavior: Behavior normal.        Assessment/Plan: 1. Essential hypertension Very elevated in office without meds for 1 week. Will send high dose of hyzaar, advised to take right away and to ensure she does not run out of meds again. Call if any problems - losartan-hydrochlorothiazide (HYZAAR) 100-25 MG tablet; Take 1 tablet by mouth daily.  Dispense: 90 tablet; Refill: 3 - metoprolol tartrate (LOPRESSOR) 50 MG tablet; Take 1 tablet (50 mg total) by mouth 2 (two) times daily.  Dispense: 60 tablet; Refill: 3  2. Type 2 diabetes mellitus without complication, without long-term current use of insulin Still have not heard anything about trulicity and will check with pharmacy to send info for PA if needed  3. OSA (obstructive sleep apnea) Continue to work on  moving forward with cpap   General Counseling: Kaitlynd verbalizes understanding of the findings of todays visit and agrees with plan of treatment. I have discussed any further diagnostic evaluation that may be needed or ordered today. We also reviewed her medications today. she has been encouraged to call the office with any questions or concerns that should arise related to todays visit.    No orders of the defined types were placed in this encounter.   Meds ordered this encounter  Medications   losartan-hydrochlorothiazide (HYZAAR) 100-25 MG tablet    Sig: Take 1 tablet by mouth daily.    Dispense:  90 tablet    Refill:  3   metoprolol tartrate (LOPRESSOR) 50 MG tablet    Sig: Take 1 tablet (50 mg total) by mouth 2 (two) times daily.    Dispense:  60 tablet    Refill:  3    This patient was seen by Lynn Ito, PA-C in collaboration with Dr. Beverely Risen as a part of collaborative care agreement.   Total time spent:30 Minutes Time spent includes review of chart, medications, test results, and follow up plan with the patient.      Dr Lyndon Code Internal medicine

## 2022-07-08 DIAGNOSIS — Z9889 Other specified postprocedural states: Secondary | ICD-10-CM | POA: Diagnosis not present

## 2022-07-13 DIAGNOSIS — R202 Paresthesia of skin: Secondary | ICD-10-CM | POA: Diagnosis not present

## 2022-07-13 DIAGNOSIS — M79671 Pain in right foot: Secondary | ICD-10-CM | POA: Diagnosis not present

## 2022-07-13 DIAGNOSIS — M25571 Pain in right ankle and joints of right foot: Secondary | ICD-10-CM | POA: Diagnosis not present

## 2022-07-13 DIAGNOSIS — R200 Anesthesia of skin: Secondary | ICD-10-CM | POA: Diagnosis not present

## 2022-07-13 DIAGNOSIS — G8929 Other chronic pain: Secondary | ICD-10-CM | POA: Diagnosis not present

## 2022-07-16 DIAGNOSIS — Z9889 Other specified postprocedural states: Secondary | ICD-10-CM | POA: Diagnosis not present

## 2022-07-20 ENCOUNTER — Telehealth: Payer: Self-pay | Admitting: Physician Assistant

## 2022-07-20 NOTE — Telephone Encounter (Addendum)
Diabetic eye exam scheduled for 11/02/22 @ 9:30 at [facility]

## 2022-07-23 ENCOUNTER — Telehealth: Payer: Self-pay

## 2022-07-23 ENCOUNTER — Telehealth: Payer: Self-pay | Admitting: Physician Assistant

## 2022-07-23 NOTE — Telephone Encounter (Signed)
Completed P.A. for patient's Trulicity. 

## 2022-07-23 NOTE — Telephone Encounter (Signed)
Diabetic eye exam scheduled for 11/02/22 @ 9:30 at [facility] 

## 2022-08-05 ENCOUNTER — Encounter: Payer: Self-pay | Admitting: Physician Assistant

## 2022-08-05 ENCOUNTER — Ambulatory Visit (INDEPENDENT_AMBULATORY_CARE_PROVIDER_SITE_OTHER): Payer: 59 | Admitting: Physician Assistant

## 2022-08-05 VITALS — BP 125/95 | HR 95 | Temp 98.3°F | Resp 16 | Ht 68.5 in | Wt 276.8 lb

## 2022-08-05 DIAGNOSIS — I10 Essential (primary) hypertension: Secondary | ICD-10-CM | POA: Diagnosis not present

## 2022-08-05 DIAGNOSIS — Z6841 Body Mass Index (BMI) 40.0 and over, adult: Secondary | ICD-10-CM

## 2022-08-05 DIAGNOSIS — E662 Morbid (severe) obesity with alveolar hypoventilation: Secondary | ICD-10-CM

## 2022-08-05 DIAGNOSIS — G4733 Obstructive sleep apnea (adult) (pediatric): Secondary | ICD-10-CM

## 2022-08-05 DIAGNOSIS — E119 Type 2 diabetes mellitus without complications: Secondary | ICD-10-CM

## 2022-08-05 MED ORDER — TRULICITY 0.75 MG/0.5ML ~~LOC~~ SOAJ: 0.7500 mg | SUBCUTANEOUS | 0 refills | Status: DC

## 2022-08-05 NOTE — Progress Notes (Signed)
[facility] [address] [place], [place] [address]  Internal MEDICINE  Office Visit Note  Patient Name: Destiny Singh  [id number]  [id number]  Date of Service: 08/05/2022  Chief Complaint  Patient presents with   Follow-up   Diabetes   Hypertension   Sleeping Problem    HPI Pt is here for routine follow up -Now taking metoprolol BID instead of all at once which has helped some, has not taken losartan-hctz yet as she jsut came from work and takes it when she gets home before going to sleep -Bp at home 139/80 before BP meds, will try to check after taking meds in future -Still looking into cpap, has new insurance and will call [facility] with this info to see if better coverage for machine now as she has severe OSA and really needs to get on treatment. This may help BP as well -is having some nausea with trulicity, has only taken 2 shots so far and will continue unless worsening S/E -down 11lbs since last visit  Current Medication: Outpatient Encounter Medications as of 08/05/2022  Medication Sig   Ashwagandha 500 MG CAPS Take 500 mg by mouth daily as needed (energy).   cholecalciferol (VITAMIN D3) 25 MCG (1000 UNIT) tablet Take 1,000 Units by mouth daily.   cyanocobalamin (,VITAMIN B-12,) 1000 MCG/ML injection Inject 1,000 mcg into the muscle See admin instructions. Every 2 months   gabapentin (NEURONTIN) 100 MG capsule Take 100 mg by mouth 3 (three) times daily.   ibuprofen (ADVIL) 800 MG tablet Take 1 tablet (800 mg total) by mouth every 8 (eight) hours as needed for mild pain.   losartan-hydrochlorothiazide (HYZAAR) 100-25 MG tablet Take 1 tablet by mouth daily.   metoprolol tartrate (LOPRESSOR) 50 MG tablet Take 1 tablet (50 mg total) by mouth 2 (two) times daily.   ondansetron (ZOFRAN) 4 MG tablet Take 1 tablet (4 mg total) by mouth every 8 (eight) hours as needed for nausea or vomiting.   [DISCONTINUED] Dulaglutide (TRULICITY) 0.75 MG/0.5ML SOPN Inject 0.75 mg into  the skin once a week.   Dulaglutide (TRULICITY) 0.75 MG/0.5ML SOPN Inject 0.75 mg into the skin once a week.   No facility-administered encounter medications on file as of 08/05/2022.    Surgical History: Past Surgical History:  Procedure Laterality Date   BREAST EXCISIONAL BIOPSY     in teens per pt   BUNIONECTOMY Right 10/01/2021   Procedure: BUNIONECTOMY - LAPIDUS - TYPE;  Surgeon: Rosetta Posner, DPM;  Location: [facility];  Service: Podiatry;  Laterality: Right;   CALCANEAL OSTEOTOMY Right 10/01/2021   Procedure: EVANS/MCDO;  Surgeon: Rosetta Posner, DPM;  Location: [facility];  Service: Podiatry;  Laterality: Right;   CHONDROPLASTY Right 04/01/2016   Procedure: CHONDROPLASTY -ABRASION OF FEMORAL TROCHLEA, LATERAL TIBIA [place];  Surgeon: Christena Flake, MD;  Location: [facility];  Service: Orthopedics;  Laterality: Right;   GASTROC RECESSION EXTREMITY Right 10/01/2021   Procedure: GASTROC RECESSION;  Surgeon: Rosetta Posner, DPM;  Location: [facility];  Service: Podiatry;  Laterality: Right;   KNEE ARTHROSCOPY Left 06/18/2020   Procedure: LEFT KNEE ARTHROSCOPY WITH DEBRIDEMENT AND PARTIAL MEDIAL MENISCECTOMY;  Surgeon: Christena Flake, MD;  Location: [facility];  Service: Orthopedics;  Laterality: Left;   KNEE ARTHROSCOPY WITH MENISCAL REPAIR Right 04/01/2016   Procedure: KNEE ARTHROSCOPY WITH PARTIAL MEDIAL  MENISCAL REPAIR;  Surgeon: Christena Flake, MD;  Location: [facility];  Service: Orthopedics;  Laterality: Right;   TENDON TRANSFER Right  10/01/2021   Procedure: FDL TRANSFER; DEEP;  Surgeon: Rosetta Posner, DPM;  Location: [facility];  Service: Podiatry;  Laterality: Right;   TENOTOMY / FLEXOR TENDON TRANSFER Right 10/01/2021   Procedure: FLEXOR TENDON REPAIR;  Surgeon: Rosetta Posner, DPM;  Location: [facility];  Service: Podiatry;  Laterality: Right;   TUBAL LIGATION     TUMOR EXCISION  2016   BENIGN-HEAD    Medical History: Past Medical History:   Diagnosis Date   Adopted    Arthritis    BOTH KNEES   Asthma    Benign tumor    Diabetes mellitus without complication (HCC)    Dyspnea    VERY RARE   Dysrhythmia    IRREGULAR PER PT   Headache    Hypertension    Myocardial infarction (HCC)    AGE 9    Family History: Family History  Adopted: Yes    Social History   Socioeconomic History   Marital status: Single    Spouse name: Not on file   Number of children: Not on file   Years of education: Not on file   Highest education level: Not on file  Occupational History   Not on file  Tobacco Use   Smoking status: Never   Smokeless tobacco: Never  Substance and Sexual Activity   Alcohol use: No   Drug use: No   Sexual activity: Not on file  Other Topics Concern   Not on file  Social History Narrative   Not on file   Social Determinants of Health   Financial Resource Strain: Not on file  Food Insecurity: Not on file  Transportation Needs: Not on file  Physical Activity: Not on file  Stress: Not on file  Social Connections: Not on file  Intimate Partner Violence: Not on file      Review of Systems  Constitutional:  Positive for fatigue. Negative for chills and unexpected weight change.  HENT:  Negative for congestion, postnasal drip, rhinorrhea, sneezing and sore throat.   Eyes:  Negative for redness.  Respiratory:  Negative for cough, chest tightness and shortness of breath.   Cardiovascular:  Negative for chest pain and palpitations.  Gastrointestinal:  Positive for nausea. Negative for abdominal pain, constipation, diarrhea and vomiting.  Genitourinary:  Negative for dysuria and frequency.  Musculoskeletal:  Positive for arthralgias. Negative for back pain, joint swelling and neck pain.  Skin:  Negative for rash.  Neurological:  Negative for tremors and numbness.  Hematological:  Negative for adenopathy. Does not bruise/bleed easily.  Psychiatric/Behavioral:  Positive for sleep disturbance.  Negative for behavioral problems (Depression) and suicidal ideas. The patient is not nervous/anxious.     Vital Signs: BP (!) 125/95   Pulse 95   Temp 98.3 F (36.8 C)   Resp 16   Ht 5' 8.5" (1.74 m)   Wt 276 lb 12.8 oz (125.6 kg)   LMP 11/23/2018 (Approximate)   SpO2 97%   BMI 41.48 kg/m    Physical Exam Vitals and nursing note reviewed.  Constitutional:      Appearance: Normal appearance.  HENT:     Head: Normocephalic and atraumatic.     Nose: Nose normal.     Mouth/Throat:     Mouth: Mucous membranes are moist.     Pharynx: No posterior oropharyngeal erythema.  Eyes:     Extraocular Movements: Extraocular movements intact.     Pupils: Pupils are equal, round, and reactive to light.  Cardiovascular:  Rate and Rhythm: Normal rate and regular rhythm.     Pulses: Normal pulses.     Heart sounds: Normal heart sounds.  Pulmonary:     Effort: Pulmonary effort is normal.     Breath sounds: Normal breath sounds.  Skin:    General: Skin is warm and dry.  Neurological:     General: No focal deficit present.     Mental Status: She is alert.  Psychiatric:        Mood and Affect: Mood normal.        Behavior: Behavior normal.        Assessment/Plan: 1. Essential hypertension BP elevated in office, but has not taken losartan-hctz yet today and will start monitoring after BP meds  2. Type 2 diabetes mellitus without complication, without long-term current use of insulin (HCC) May continue trulicity at current dose and see if S/E improve due to some nausea but states it is manageable  - Dulaglutide (TRULICITY) 0.75 MG/0.5ML SOPN; Inject 0.75 mg into the skin once a week.  Dispense: 2 mL; Refill: 0  3. OSA (obstructive sleep apnea) Will call [facility] about machine coverage with new insurance  4. Class 3 obesity with alveolar hypoventilation, serious comorbidity, and body mass index (BMI) of 40.0 to 44.9 in adult [facility]) Down 11 lbs since last visit and will continue to  work on diet and continue trulicity as before   General Counseling: Destiny Singh verbalizes understanding of the findings of todays visit and agrees with plan of treatment. I have discussed any further diagnostic evaluation that may be needed or ordered today. We also reviewed her medications today. she has been encouraged to call the office with any questions or concerns that should arise related to todays visit.    No orders of the defined types were placed in this encounter.   Meds ordered this encounter  Medications   Dulaglutide (TRULICITY) 0.75 MG/0.5ML SOPN    Sig: Inject 0.75 mg into the skin once a week.    Dispense:  2 mL    Refill:  0    This patient was seen by Lynn Ito, PA-C in collaboration with Dr. Beverely Risen as a part of collaborative care agreement.   Total time spent:30 Minutes Time spent includes review of chart, medications, test results, and follow up plan with the patient.      Dr Lyndon Code Internal medicine

## 2022-08-12 DIAGNOSIS — Z9889 Other specified postprocedural states: Secondary | ICD-10-CM | POA: Diagnosis not present

## 2022-08-17 DIAGNOSIS — R202 Paresthesia of skin: Secondary | ICD-10-CM | POA: Diagnosis not present

## 2022-08-17 DIAGNOSIS — R200 Anesthesia of skin: Secondary | ICD-10-CM | POA: Diagnosis not present

## 2022-08-19 DIAGNOSIS — Z9889 Other specified postprocedural states: Secondary | ICD-10-CM | POA: Diagnosis not present

## 2022-08-23 DIAGNOSIS — M25571 Pain in right ankle and joints of right foot: Secondary | ICD-10-CM | POA: Diagnosis not present

## 2022-08-23 DIAGNOSIS — G5781 Other specified mononeuropathies of right lower limb: Secondary | ICD-10-CM | POA: Diagnosis not present

## 2022-08-23 DIAGNOSIS — M216X2 Other acquired deformities of left foot: Secondary | ICD-10-CM | POA: Diagnosis not present

## 2022-08-23 DIAGNOSIS — G8929 Other chronic pain: Secondary | ICD-10-CM | POA: Diagnosis not present

## 2022-08-23 DIAGNOSIS — M79671 Pain in right foot: Secondary | ICD-10-CM | POA: Diagnosis not present

## 2022-08-23 DIAGNOSIS — M2141 Flat foot [pes planus] (acquired), right foot: Secondary | ICD-10-CM | POA: Diagnosis not present

## 2022-08-23 DIAGNOSIS — E1142 Type 2 diabetes mellitus with diabetic polyneuropathy: Secondary | ICD-10-CM | POA: Diagnosis not present

## 2022-08-23 DIAGNOSIS — M216X1 Other acquired deformities of right foot: Secondary | ICD-10-CM | POA: Diagnosis not present

## 2022-08-23 DIAGNOSIS — M9689 Other intraoperative and postprocedural complications and disorders of the musculoskeletal system: Secondary | ICD-10-CM | POA: Diagnosis not present

## 2022-08-23 DIAGNOSIS — M2142 Flat foot [pes planus] (acquired), left foot: Secondary | ICD-10-CM | POA: Diagnosis not present

## 2022-08-23 DIAGNOSIS — M19071 Primary osteoarthritis, right ankle and foot: Secondary | ICD-10-CM | POA: Diagnosis not present

## 2022-08-25 ENCOUNTER — Encounter (INDEPENDENT_AMBULATORY_CARE_PROVIDER_SITE_OTHER): Payer: 59 | Admitting: Internal Medicine

## 2022-08-25 DIAGNOSIS — G4733 Obstructive sleep apnea (adult) (pediatric): Secondary | ICD-10-CM | POA: Diagnosis not present

## 2022-08-31 NOTE — Procedures (Signed)
[facility]  Polysomnogram Report Part I  Phone: [phone] Fax: [phone]  Patient Name: Destiny Singh, Destiny Singh. Acquisition Number: [id number]  Date of Birth: [birth year] Acquisition Date: 08/25/2022  Referring Physician: Lynn Ito PA-C     History: The patient is a 52 year old  . Medical History: Diabetes mellitus, hypertension.  Medications: Ashwagndha, cholecalciferol, cyanocobalamin, HCTZ, semaglutide, losartan-HCTZ, metoprolol.  Procedure: This routine overnight polysomnogram was performed on the [facility] 5 using the standard CPAP/BIPAP protocol. This included 6 channels of EEG, 2 channels of EOG, chin EMG, bilateral anterior tibialis EMG, nasal/oral thermistor, PTAF (nasal pressure transducer), chest and abdominal wall movements, EKG, and pulse oximetry.  Description: The total recording time was 375.0 minutes. The total sleep time was 230.5 minutes. There were a total of 136.0 minutes of wakefulness after sleep onset for areducedsleep efficiency of 61.5%. The latency to sleep onset was short at 8.5 minutes. The R sleep onset latency wasprolonged at 142.5 minutes. Sleep parameters, as a percentage of the total sleep time, demonstrated 39.0% of sleep was in N1 sleep, 59.4% N2, 0.0% N3 and 1.5% R sleep. There were a total of 44 arousals for an arousal index of 11.5 arousals per hour of sleep that was within normal limits.  Overall, there were a total of 188 respiratory events for a respiratory disturbance index, which includes apneas, hypopneas and RERAs (increased respiratory effort) of 48.9 respiratory events per hour of sleep during the pressure titration.  was initiated at 4 cm H2O at lights out, 11:35 p.m. It was titrated in 1-2 cm increments for obstructive and central events to 14 cm H2O. Due to persistent obstructive and central apneas the mode was changed to bilevel pressure 14/12 cm  H2O.  The pressure was reduced after the patient woke and then was retitrated to the final  pressure of 22/16 cm H2O. Central apneas persisted in wake and sleep. Once the obstructive apnea was controlled approximately 80% of the the respiratory events were central apneas.  Additionally, the baseline oxygen saturation during wakefulness was 99%, during NREM sleep averaged 99%, and during REM sleep averaged 90%. The total duration of oxygen < 90% was 2.3 minutes.  Cardiac monitoring-  significant cardiac rhythm irregularities.   Periodic limb movement monitoring- did not demonstrate periodic limb movements.    Impression: This patient's obstructive sleep apnea demonstrated significant improvement with the utilization of nasal , however central apneas emerged and were not controlled. The respiratory rate in sleep was 5 in the early morning..   Recommendations: Would recommend a BiPAP SV titration.      A large Airfit F20 mask was used. Chin strap used during study- no. Humidifier used during study- yes.     Yevonne Pax, MD, [facility] Diplomate ABMS-Pulmonary, Critical Care and Sleep Medicine  Electronically reviewed and digitally signed [facility] CPAP/BIPAP Polysomnogram Report Part II Phone: [phone] Fax: [phone]  Patient last name Singh Neck Size    in. Acquisition [phone]  Patient first name Destiny R. Weight 282.0 lbs. Started 08/25/2022 at 11:16:42 PM  Birth date [birth year] Height 68.0 in. Stopped 08/26/2022 at 5:53:24 AM  Age 43      Type Adult BMI 42.9 lb/in2 Duration 375.0  Tech: Robbi Garter, RPSGT, Ricky P.  Reviewed by: Valentino Hue Henke, PhD, ABSM, FAASM Sleep Data: Lights Out: 11:35:42 PM Sleep Onset: 11:44:12 PM  Lights On: 5:50:42 AM Sleep Efficiency: 61.5 %  Total Recording Time: 375.0 min Sleep Latency (from Lights Off) 8.5 min  Total Sleep Time (TST): 230.5 min R Latency (from Sleep Onset): 142.5 min  Sleep Period Time: 366.0 min Total number of awakenings: 50  Wake during sleep: 135.5 min Wake After Sleep Onset (WASO): 136.0 min   Sleep  Data:         Arousal Summary: Stage  Latency from lights out (min) Latency from sleep onset (min) Duration (min) % Total Sleep Time  Normal values  N 1 8.5 0.0 90.0 39.0 (5%)  N 2 9.0 0.5 137.0 59.4 (50%)  N 3       0.0 0.0 (20%)  R 151.0 142.5 3.5 1.5 (25%)   Number Index  Spontaneous 39 10.2  Apneas & Hypopneas 7 1.8  RERAs 0 0.0       (Apneas & Hypopneas & RERAs)  (7) (1.8)  Limb Movement 0 0.0  Snore 0 0.0  TOTAL 46 12.0     Respiratory Data:  CA OA MA Apnea Hypopnea* A+ H RERA Total  Number 99 80 0 179 9 188 0 188  Mean Dur (sec) 17.0 20.0 0.0 18.3 13.3 18.1 0.0 18.1  Max Dur (sec) 62.5 69.5 0.0 69.5 19.0 69.5 0.0 69.5  Total Dur (min) 28.1 26.6 0.0 54.7 2.0 56.7 0.0 56.7  % of TST 12.2 11.5 0.0 23.7 0.9 24.6 0.0 24.6  Index (#/h TST) 25.8 20.8 0.0 46.6 2.3 48.9 0.0 48.9  *Hypopneas scored based on 4% or greater desaturation.  Sleep Stage:         REM NREM TST  AHI 85.7 46.0 48.9  RDI 85.7 46.0 48.9   Sleep (min) TST (%) REM (min) NREM (min) CA (#) OA (#) MA (#) HYP (#) AHI (#/h) RERA (#) RDI (#/h) Desat (#)  Supine 230.5 100.00 3.5 227.0 99 80 0 9 48.9 0 48.9 54  Non-Supine 0.00 0.00 0.00 0.00 0.00 0.00 0.00 0.00 0.00 0 0.00 0.00     Snoring: Total number of snoring episodes  0  Total time with snoring    min (   % of sleep)   Oximetry Distribution:             WK REM NREM TOTAL  Average (%)   99 90 99 99  < 90% 0.0 1.4 0.9 2.3  < 80% 0.0 0.0 0.0 0.0  < 70% 0.0 0.0 0.0 0.0  # of Desaturations* 1 6 47 54  Desat Index (#/hour) 0.4 102.9 12.4 14.1  Desat Max (%) 5 13 11 13   Desat Max Dur (sec) 8.0 36.0 69.0 69.0  Approx Min O2 during sleep 80  Approx min O2 during a respiratory event 80  Was Oxygen added (Y/N) and final rate :    LPM  *Desaturations based on 4% or greater drop from baseline.   Cheyne Stokes Breathing: None Present    Heart Rate Summary:  Average Heart Rate During Sleep 72.8 bpm      Highest Heart Rate During Sleep (95th  %) 111.0 bpm      Highest Heart Rate During Sleep 174 bpm (artifact)  Highest Heart Rate During Recording (TIB) 230 bpm (artifact)   Heart Rate Observations: Event Type # Events   Bradycardia 0 Lowest HR Scored: N/A  Sinus Tachycardia During Sleep 0 Highest HR Scored: N/A  Narrow Complex Tachycardia 0 Highest HR Scored: N/A  Wide Complex Tachycardia 0 Highest HR Scored: N/A  Asystole 0 Longest Pause: N/A  Atrial Fibrillation 0 Duration Longest Event: N/A  Other Arrythmias   Type:  Periodic Limb Movement Data: (Primary legs unless otherwise noted) Total # Limb Movement 0 Limb Movement Index 0.0  Total # PLMS    PLMS Index     Total # PLMS Arousals    PLMS Arousal Index     Percentage Sleep Time with PLMS   min (   % sleep)  Mean Duration limb movements (secs)       IPAP Level (cmH2O) EPAP Level (cmH2O) Total Duration (min) Sleep Duration (min) Sleep (%) REM (%) CA  #) OA # MA # HYP #) AHI (#/hr) RERAs # RERAs (#/hr) RDI (#/hr)  0 0 3.9 0.0 0.0 0.0 0 0 0 0 0.0 0 0.0 0.0  4 4 10.7 10.7 100.0 0.0 2 10 0 0 67.3 0 0.0 67.3  5 5  4.1 4.1 100.0 0.0 4 2 0 0 87.8 0 0.0 87.8  6 6  37.6 22.5 59.8 9.3 5 24  0 0 77.3 0 0.0 77.3  8 8  18.7 18.7 100.0 0.0 8 15 0 2 80.2 0 0.0 80.2  9 9  6.8 6.8 100.0 0.0 0 2 0 5 61.8 0 0.0 61.8  10 10  31.8 22.8 71.7 0.0 9 5 0 2 42.1 0 0.0 42.1  11 11  38.6 15.1 39.1 0.0 6 2 0 0 31.8 0 0.0 31.8  12 12  22.1 7.1 32.1 0.0 10 0 0 0 84.5 0 0.0 84.5  13 13  7.1 5.6 78.9 0.0 0 7 0 0 75.0 0 0.0 75.0  14 12 9.9 2.2 22.2 0.0 2 0 0 0 54.5 0 0.0 54.5  14 14  5.5 5.1 92.7 0.0 4 0 0 0 47.1 0 0.0 47.1  15 15  9.8 9.8 100.0 0.0 0 6 0 0 36.7 0 0.0 36.7  16 12  6.5 5.5 84.6 0.0 5 0 0 0 54.5 0 0.0 54.5  17 11  5.2 5.2 100.0 0.0 2 1 0 0 34.6 0 0.0 34.6  17 17  10.8 5.8 53.7 0.0 2 2 0 0 41.4 0 0.0 41.4  18 14  16.0 5.6 35.0 0.0 6 0 0 0 64.3 0 0.0 64.3  19 13  3.5 3.5 100.0 0.0 1 2 0 0 51.4 0 0.0 51.4  20 14  7.2 5.7 79.2 0.0 4 0 0 0 42.1 0 0.0 42.1  21 15  58.9 34.4 58.4 0.0 22 1 0 0 40.1 0  0.0 40.1  22 16  33.9 28.4 83.8 0.0 5 0 0 0 10.6 0 0.0 10.6

## 2022-09-02 ENCOUNTER — Ambulatory Visit: Payer: 59 | Admitting: Physician Assistant

## 2022-09-21 DIAGNOSIS — R200 Anesthesia of skin: Secondary | ICD-10-CM | POA: Diagnosis not present

## 2022-09-21 DIAGNOSIS — G629 Polyneuropathy, unspecified: Secondary | ICD-10-CM | POA: Diagnosis not present

## 2022-09-21 DIAGNOSIS — R202 Paresthesia of skin: Secondary | ICD-10-CM | POA: Diagnosis not present

## 2022-09-21 DIAGNOSIS — M79671 Pain in right foot: Secondary | ICD-10-CM | POA: Diagnosis not present

## 2022-09-23 DIAGNOSIS — Z9889 Other specified postprocedural states: Secondary | ICD-10-CM | POA: Diagnosis not present

## 2022-09-27 ENCOUNTER — Encounter: Payer: Self-pay | Admitting: Physician Assistant

## 2022-09-27 ENCOUNTER — Ambulatory Visit (INDEPENDENT_AMBULATORY_CARE_PROVIDER_SITE_OTHER): Payer: 59 | Admitting: Physician Assistant

## 2022-09-27 VITALS — BP 120/90 | HR 90 | Temp 98.3°F | Resp 16 | Ht 68.5 in | Wt 273.8 lb

## 2022-09-27 DIAGNOSIS — Z1212 Encounter for screening for malignant neoplasm of rectum: Secondary | ICD-10-CM

## 2022-09-27 DIAGNOSIS — G4733 Obstructive sleep apnea (adult) (pediatric): Secondary | ICD-10-CM | POA: Diagnosis not present

## 2022-09-27 DIAGNOSIS — Z1231 Encounter for screening mammogram for malignant neoplasm of breast: Secondary | ICD-10-CM | POA: Diagnosis not present

## 2022-09-27 DIAGNOSIS — R946 Abnormal results of thyroid function studies: Secondary | ICD-10-CM | POA: Diagnosis not present

## 2022-09-27 DIAGNOSIS — Z1211 Encounter for screening for malignant neoplasm of colon: Secondary | ICD-10-CM | POA: Diagnosis not present

## 2022-09-27 DIAGNOSIS — E559 Vitamin D deficiency, unspecified: Secondary | ICD-10-CM

## 2022-09-27 DIAGNOSIS — Z0001 Encounter for general adult medical examination with abnormal findings: Secondary | ICD-10-CM

## 2022-09-27 DIAGNOSIS — E782 Mixed hyperlipidemia: Secondary | ICD-10-CM | POA: Diagnosis not present

## 2022-09-27 DIAGNOSIS — E119 Type 2 diabetes mellitus without complications: Secondary | ICD-10-CM

## 2022-09-27 DIAGNOSIS — I10 Essential (primary) hypertension: Secondary | ICD-10-CM

## 2022-09-27 DIAGNOSIS — E538 Deficiency of other specified B group vitamins: Secondary | ICD-10-CM

## 2022-09-27 DIAGNOSIS — R5383 Other fatigue: Secondary | ICD-10-CM

## 2022-09-27 LAB — POCT GLYCOSYLATED HEMOGLOBIN (HGB A1C): Hemoglobin A1C: 6.5 % — AB (ref 4.0–5.6)

## 2022-09-27 MED ORDER — TRULICITY 1.5 MG/0.5ML ~~LOC~~ SOAJ: 1.5000 mg | SUBCUTANEOUS | 1 refills | Status: DC

## 2022-09-27 NOTE — Progress Notes (Signed)
[facility] [address] [place], [place] [address]  Internal MEDICINE  Office Visit Note  Patient Name: Destiny Singh  [id number]  [id number]  Date of Service: 09/27/2022  Chief Complaint  Patient presents with   Diabetes   Hypertension   Follow-up    Needs mammogram and colonoscopy      HPI Pt is here for routine health maintenance examination -Seeing neurology for neuropathy in BLE, BP was elevated at 142/100 after walking a distance with pain in their office. They are lowering gabapentin and added nortriptyline 25mg  daily -Doing well with trulicity, would like to increase dose -did cpap titration, but now needs another for SV titration which will be on Aug 3 -down another 3lbs since last visit -due for mammogram and colonoscopy -labs due -foot exam done, right foot post surgical changes -Has not taken BP meds again, so BP still up -Eye exam in Aug  Current Medication: Outpatient Encounter Medications as of 09/27/2022  Medication Sig   Ashwagandha 500 MG CAPS Take 500 mg by mouth daily as needed (energy).   cholecalciferol (VITAMIN D3) 25 MCG (1000 UNIT) tablet Take 1,000 Units by mouth daily.   cyanocobalamin (,VITAMIN B-12,) 1000 MCG/ML injection Inject 1,000 mcg into the muscle See admin instructions. Every 2 months   Dulaglutide (TRULICITY) 1.5 MG/0.5ML SOPN Inject 1.5 mg into the skin once a week.   gabapentin (NEURONTIN) 100 MG capsule Take 100 mg by mouth 3 (three) times daily.   ibuprofen (ADVIL) 800 MG tablet Take 1 tablet (800 mg total) by mouth every 8 (eight) hours as needed for mild pain.   losartan-hydrochlorothiazide (HYZAAR) 100-25 MG tablet Take 1 tablet by mouth daily.   metoprolol tartrate (LOPRESSOR) 50 MG tablet Take 1 tablet (50 mg total) by mouth 2 (two) times daily.   ondansetron (ZOFRAN) 4 MG tablet Take 1 tablet (4 mg total) by mouth every 8 (eight) hours as needed for nausea or vomiting.   [DISCONTINUED] Dulaglutide (TRULICITY) 0.75  MG/0.5ML SOPN Inject 0.75 mg into the skin once a week.   No facility-administered encounter medications on file as of 09/27/2022.    Surgical History: Past Surgical History:  Procedure Laterality Date   BREAST EXCISIONAL BIOPSY     in teens per pt   BUNIONECTOMY Right 10/01/2021   Procedure: BUNIONECTOMY - LAPIDUS - TYPE;  Surgeon: Rosetta Posner, DPM;  Location: [facility];  Service: Podiatry;  Laterality: Right;   CALCANEAL OSTEOTOMY Right 10/01/2021   Procedure: EVANS/MCDO;  Surgeon: Rosetta Posner, DPM;  Location: [facility];  Service: Podiatry;  Laterality: Right;   CHONDROPLASTY Right 04/01/2016   Procedure: CHONDROPLASTY -ABRASION OF FEMORAL TROCHLEA, LATERAL TIBIA Plateau;  Surgeon: Christena Flake, MD;  Location: [facility];  Service: Orthopedics;  Laterality: Right;   GASTROC RECESSION EXTREMITY Right 10/01/2021   Procedure: GASTROC RECESSION;  Surgeon: Rosetta Posner, DPM;  Location: [facility];  Service: Podiatry;  Laterality: Right;   KNEE ARTHROSCOPY Left 06/18/2020   Procedure: LEFT KNEE ARTHROSCOPY WITH DEBRIDEMENT AND PARTIAL MEDIAL MENISCECTOMY;  Surgeon: Christena Flake, MD;  Location: [facility];  Service: Orthopedics;  Laterality: Left;   KNEE ARTHROSCOPY WITH MENISCAL REPAIR Right 04/01/2016   Procedure: KNEE ARTHROSCOPY WITH PARTIAL MEDIAL  MENISCAL REPAIR;  Surgeon: Christena Flake, MD;  Location: [facility];  Service: Orthopedics;  Laterality: Right;   TENDON TRANSFER Right 10/01/2021   Procedure: FDL TRANSFER; DEEP;  Surgeon: Rosetta Posner, DPM;  Location: [facility];  Service: Podiatry;  Laterality: Right;   TENOTOMY / FLEXOR TENDON TRANSFER Right 10/01/2021   Procedure: FLEXOR TENDON REPAIR;  Surgeon: Rosetta Posner, DPM;  Location: [facility];  Service: Podiatry;  Laterality: Right;   TUBAL LIGATION     TUMOR EXCISION  2016   BENIGN-HEAD    Medical History: Past Medical History:  Diagnosis Date   Adopted    Arthritis    BOTH KNEES    Asthma    Benign tumor    Diabetes mellitus without complication (HCC)    Dyspnea    VERY RARE   Dysrhythmia    IRREGULAR PER PT   Headache    Hypertension    Myocardial infarction (HCC)    AGE 52    Family History: Family History  Adopted: Yes      Review of Systems  Constitutional:  Positive for fatigue. Negative for chills and unexpected weight change.  HENT:  Negative for congestion, postnasal drip, rhinorrhea, sneezing and sore throat.   Eyes:  Negative for redness.  Respiratory:  Negative for cough, chest tightness and shortness of breath.   Cardiovascular:  Negative for chest pain and palpitations.  Gastrointestinal:  Negative for abdominal pain, constipation, diarrhea, nausea and vomiting.  Genitourinary:  Negative for dysuria and frequency.  Musculoskeletal:  Positive for arthralgias and gait problem. Negative for back pain, joint swelling and neck pain.  Skin:  Negative for rash.  Neurological:  Negative for tremors and numbness.  Hematological:  Negative for adenopathy. Does not bruise/bleed easily.  Psychiatric/Behavioral:  Positive for sleep disturbance. Negative for behavioral problems (Depression) and suicidal ideas. The patient is not nervous/anxious.      Vital Signs: BP (!) 120/90   Pulse 90   Temp 98.3 F (36.8 C)   Resp 16   Ht 5' 8.5" (1.74 m)   Wt 273 lb 12.8 oz (124.2 kg)   LMP 11/23/2018 (Approximate)   SpO2 94%   BMI 41.03 kg/m    Physical Exam Vitals and nursing note reviewed.  Constitutional:      Appearance: Normal appearance.  HENT:     Head: Normocephalic and atraumatic.     Nose: Nose normal.     Mouth/Throat:     Mouth: Mucous membranes are moist.     Pharynx: No posterior oropharyngeal erythema.  Eyes:     Extraocular Movements: Extraocular movements intact.     Pupils: Pupils are equal, round, and reactive to light.  Cardiovascular:     Rate and Rhythm: Normal rate and regular rhythm.     Pulses: Normal pulses.      Heart sounds: Normal heart sounds.  Pulmonary:     Effort: Pulmonary effort is normal.     Breath sounds: Normal breath sounds.  Chest:     Chest wall: No tenderness.  Breasts:    Right: No mass.     Left: No mass.  Abdominal:     General: Bowel sounds are normal.     Tenderness: There is no abdominal tenderness.  Musculoskeletal:        General: Normal range of motion.     Right foot: Normal range of motion.     Left foot: Normal range of motion.  Feet:     Right foot:     Protective Sensation: 2 sites tested.  2 sites sensed.     Skin integrity: Skin integrity normal.     Toenail Condition: Right toenails are normal.     Left foot:     Protective  Sensation: 2 sites tested.  2 sites sensed.     Skin integrity: Skin integrity normal.     Toenail Condition: Left toenails are normal.  Skin:    General: Skin is warm and dry.  Neurological:     General: No focal deficit present.     Mental Status: She is alert.  Psychiatric:        Mood and Affect: Mood normal.        Behavior: Behavior normal.      LABS: Recent Results (from the past 2160 hour(s))  POCT glycosylated hemoglobin (Hb A1C)     Status: Abnormal   Collection Time: 09/27/22  9:07 AM  Result Value Ref Range   Hemoglobin A1C 6.5 (A) 4.0 - 5.6 %   HbA1c POC (<> result, manual entry)     HbA1c, POC (prediabetic range)     HbA1c, POC (controlled diabetic range)         Assessment/Plan: 1. Encounter for general adult medical examination with abnormal findings CPE performed, labs ordered, due for mammogram and colonoscopy, eye exam scheduled  2. Type 2 diabetes mellitus without complication, without long-term current use of insulin (HCC) - POCT glycosylated hemoglobin (Hb A1C) is 6.5 which is improved from 6.6 last check, will increase trulicity - Dulaglutide (TRULICITY) 1.5 MG/0.5ML SOPN; Inject 1.5 mg into the skin once a week.  Dispense: 6 mL; Refill: 1  3. Essential hypertension Elevated in office, but  again pt has not taken meds yet and is due to take them. Advised her to take meds before coming to next appt so we can see how meds are working before adjusting further  4. OSA (obstructive sleep apnea) SV titration scheduled  5. Screening for colorectal cancer - Ambulatory referral to Gastroenterology  6. Visit for screening mammogram - MM 3D SCREENING MAMMOGRAM BILATERAL BREAST; Future  7. Abnormal thyroid exam - TSH + free T4  8. B12 deficiency - B12 and Folate Panel  9. Vitamin D deficiency - VITAMIN D 25 Hydroxy (Vit-D Deficiency, Fractures)  10. Mixed hyperlipidemia - Lipid Panel With LDL/HDL Ratio  11. Other fatigue - CBC w/Diff/Platelet - Comprehensive metabolic panel - TSH + free T4 - Lipid Panel With LDL/HDL Ratio - Fe+TIBC+Fer - Z61 and Folate Panel - VITAMIN D 25 Hydroxy (Vit-D Deficiency, Fractures)   General Counseling: Adaiah verbalizes understanding of the findings of todays visit and agrees with plan of treatment. I have discussed any further diagnostic evaluation that may be needed or ordered today. We also reviewed her medications today. she has been encouraged to call the office with any questions or concerns that should arise related to todays visit.    Counseling:    Orders Placed This Encounter  Procedures   MM 3D SCREENING MAMMOGRAM BILATERAL BREAST   CBC w/Diff/Platelet   Comprehensive metabolic panel   TSH + free T4   Lipid Panel With LDL/HDL Ratio   Fe+TIBC+Fer   W96 and Folate Panel   VITAMIN D 25 Hydroxy (Vit-D Deficiency, Fractures)   Ambulatory referral to Gastroenterology   POCT glycosylated hemoglobin (Hb A1C)    Meds ordered this encounter  Medications   Dulaglutide (TRULICITY) 1.5 MG/0.5ML SOPN    Sig: Inject 1.5 mg into the skin once a week.    Dispense:  6 mL    Refill:  1    This patient was seen by Lynn Ito, PA-C in collaboration with Dr. Beverely Risen as a part of collaborative care agreement.  Total time  spent:35  Minutes  Time spent includes review of chart, medications, test results, and follow up plan with the patient.     Lyndon Code, MD  Internal Medicine

## 2022-10-07 ENCOUNTER — Encounter: Payer: Self-pay | Admitting: *Deleted

## 2022-10-18 ENCOUNTER — Telehealth: Payer: Self-pay | Admitting: Physician Assistant

## 2022-10-18 NOTE — Telephone Encounter (Signed)
Patient called regarding paperwork from insurance. I explained to her there is no documentation in her chart where we received anything. She will call insurance back and have them refax-Toni

## 2022-10-19 ENCOUNTER — Telehealth: Payer: Self-pay

## 2022-10-19 NOTE — Telephone Encounter (Signed)
Completed P.A. for patient's Trulicity.

## 2022-10-22 ENCOUNTER — Telehealth: Payer: Self-pay

## 2022-10-22 NOTE — Telephone Encounter (Signed)
PA was approved for Trulicity and pt was notified.

## 2022-11-01 ENCOUNTER — Ambulatory Visit: Payer: 59 | Admitting: Physician Assistant

## 2022-11-05 ENCOUNTER — Telehealth: Payer: Self-pay

## 2022-11-05 NOTE — Telephone Encounter (Signed)
Left message for patient regarding normal diabetic eye exam.

## 2022-11-16 ENCOUNTER — Other Ambulatory Visit: Payer: Self-pay

## 2022-11-16 ENCOUNTER — Other Ambulatory Visit: Payer: Self-pay | Admitting: Physician Assistant

## 2022-11-16 DIAGNOSIS — E119 Type 2 diabetes mellitus without complications: Secondary | ICD-10-CM

## 2022-11-16 MED ORDER — TRULICITY 1.5 MG/0.5ML ~~LOC~~ SOAJ: 1.5000 mg | SUBCUTANEOUS | 1 refills | Status: DC

## 2022-11-19 DIAGNOSIS — E782 Mixed hyperlipidemia: Secondary | ICD-10-CM | POA: Diagnosis not present

## 2022-11-19 DIAGNOSIS — R946 Abnormal results of thyroid function studies: Secondary | ICD-10-CM | POA: Diagnosis not present

## 2022-11-19 DIAGNOSIS — E538 Deficiency of other specified B group vitamins: Secondary | ICD-10-CM | POA: Diagnosis not present

## 2022-11-19 DIAGNOSIS — R5383 Other fatigue: Secondary | ICD-10-CM | POA: Diagnosis not present

## 2022-11-19 DIAGNOSIS — E559 Vitamin D deficiency, unspecified: Secondary | ICD-10-CM | POA: Diagnosis not present

## 2022-11-20 ENCOUNTER — Encounter (INDEPENDENT_AMBULATORY_CARE_PROVIDER_SITE_OTHER): Payer: Self-pay | Admitting: Internal Medicine

## 2022-11-20 DIAGNOSIS — G4733 Obstructive sleep apnea (adult) (pediatric): Secondary | ICD-10-CM

## 2022-11-24 DIAGNOSIS — M2142 Flat foot [pes planus] (acquired), left foot: Secondary | ICD-10-CM | POA: Diagnosis not present

## 2022-11-24 DIAGNOSIS — M216X1 Other acquired deformities of right foot: Secondary | ICD-10-CM | POA: Diagnosis not present

## 2022-11-24 DIAGNOSIS — M2141 Flat foot [pes planus] (acquired), right foot: Secondary | ICD-10-CM | POA: Diagnosis not present

## 2022-11-24 DIAGNOSIS — M2042 Other hammer toe(s) (acquired), left foot: Secondary | ICD-10-CM | POA: Diagnosis not present

## 2022-11-24 DIAGNOSIS — M79672 Pain in left foot: Secondary | ICD-10-CM | POA: Diagnosis not present

## 2022-11-24 DIAGNOSIS — E1142 Type 2 diabetes mellitus with diabetic polyneuropathy: Secondary | ICD-10-CM | POA: Diagnosis not present

## 2022-11-24 DIAGNOSIS — M216X2 Other acquired deformities of left foot: Secondary | ICD-10-CM | POA: Diagnosis not present

## 2022-11-24 DIAGNOSIS — M19071 Primary osteoarthritis, right ankle and foot: Secondary | ICD-10-CM | POA: Diagnosis not present

## 2022-11-24 DIAGNOSIS — M2012 Hallux valgus (acquired), left foot: Secondary | ICD-10-CM | POA: Diagnosis not present

## 2022-11-24 DIAGNOSIS — Z6841 Body Mass Index (BMI) 40.0 and over, adult: Secondary | ICD-10-CM | POA: Diagnosis not present

## 2022-11-24 DIAGNOSIS — M25572 Pain in left ankle and joints of left foot: Secondary | ICD-10-CM | POA: Diagnosis not present

## 2022-11-26 NOTE — Procedures (Signed)
[facility]  Polysomnogram Report Part I  Phone: [phone] Fax: [phone]  Patient Name: Destiny Singh, Destiny Singh. Acquisition Number: [id number]  Date of Birth: [birth year] Acquisition Date: 11/20/2022  Referring Physician: Lynn Ito, PA-C     History: The patient is a 52 year old  . Medical History: Diabetes mellitus and hypertension.  Medications: ashwagndha, cholecalciferol, cyanocobalamin, HCTZ, semaglutide, losartan-HCTZ and metoprolol.  Procedure: This routine overnight polysomnogram was performed on the Alice 5 using the standard CPAP protocol. This included 6 channels of EEG, 2 channels of EOG, chin EMG, bilateral anterior tibialis EMG, nasal/oral thermistor, PTAF (nasal pressure transducer), chest and abdominal wall movements, EKG, and pulse oximetry.  Description: The total recording time was 380.5 minutes. The total sleep time was 287.5 minutes. There were a total of 91.8 minutes of wakefulness after sleep onset for areducedsleep efficiency of 75.6%. The latency to sleep onset was shortat 1.2 minutes. The R sleep onset latency was very short at 24.0 minutes. Sleep parameters, as a percentage of the total sleep time, demonstrated 21.0% of sleep was in N1 sleep, 41.6% N2, 7.1% N3 and 30.3% R sleep. There were a total of 244 arousals for an arousal index of 50.9 arousals per hour of sleep that was elevated.  Overall, there were a total of 94 respiratory events for a respiratory disturbance index, which includes apneas, hypopneas and RERAs (increased respiratory effort) of 22.9 respiratory events per hour of sleep during the pressure titration.  was initiated at the following settings at lights out, 11:34 p.m.: EPAPmin 5, EPAPmax 15, PSmin 3, PSmax 15, rate auto. The pressure remained at this setting throughout the study. Respiratory persisted in the supine position but were alleviated in the right, lateral position. The patient's respiratory rate often slowed to as low as 5 in both  wake and light sleep.  Additionally, the baseline oxygen saturation during wakefulness was 97%, during NREM sleep averaged 97%, and during REM sleep averaged 96%. The total duration of oxygen < 90% was 3.8 minutes.  Cardiac monitoring-  significant cardiac rhythm irregularities.   Periodic limb movement monitoring- did not demonstrate periodic limb movements.     Impression: This patient's obstructive sleep apnea demonstrated significant improvement with the utilization of nasal BiPAP SV when in the right, lateral position. The respiratory events persisted in the supine position. As observed in the prior study, he patient's respiratory rate often slowed to as low as 5 in both wake and light sleep. Further follow up is suggested.  Recommendations: Would recommend utilization of nasal BiPAP SV at the following settings: EPAPmin 5, EPAPmax 15, PSmin 3, PSmax 15, rate auto.  Avoid the supine sleep position, with the use of a positional device if needed. A Small ResMed Quattro Mirage mask was used. Chin strap used during study- no. Humidifier used during study- yes.     Yevonne Pax, MD, [facility] Diplomate ABMS-Pulmonary, Critical Care and Sleep Medicine  Electronically reviewed and digitally signed  [facility] CPAP/BIPAP Polysomnogram Report Part II Phone: [phone] Fax: [phone]  Patient last name Telford Neck Size 15.5 in. Acquisition [phone]  Patient first name Destiny Singh Weight 272.0 lbs. Started 11/20/2022 at 11:11:00 PM  Birth date Mar 05, 1971 Height 68.0 in. Stopped 11/21/2022 at 7:25:24 AM  Age 73      Type Adult BMI 41.4 lb/in2 Duration 380.5  Report generated by Otho Perl, RPSGT  Reviewed by: Valentino Hue. Sol Blazing, PhD, ABSM, FAASM Sleep Data: Lights Out: 11:34:48 PM Sleep Onset: 11:36:00 PM  Lights On: 5:55:18 AM Sleep Efficiency: 75.6 %  Total Recording Time: 380.5 min Sleep Latency (from Lights Off) 1.2 min  Total Sleep Time (TST): 287.5 min R Latency (from Sleep  Onset): 24.0 min  Sleep Period Time: 379.0 min Total number of awakenings: 37  Wake during sleep: 91.5 min Wake After Sleep Onset (WASO): 91.8 min   Sleep Data:         Arousal Summary: Stage  Latency from lights out (min) Latency from sleep onset (min) Duration (min) % Total Sleep Time  Normal values  N 1 1.2 0.0 60.5 21.0 (5%)  N 2 5.2 4.0 119.5 41.6 (50%)  N 3 63.7 62.5 20.5 7.1 (20%)  R 25.2 24.0 87.0 30.3 (25%)   Number Index  Spontaneous 152 31.7  Apneas & Hypopneas 89 18.6  RERAs 0 0.0       (Apneas & Hypopneas & RERAs)  (89) (18.6)  Limb Movement 3 0.6  Snore 0 0.0  TOTAL 244 50.9     Respiratory Data:  CA OA MA Apnea Hypopnea* A+ H RERA Total  Number 0 1 0 1 93 94 0 94  Mean Dur (sec) 0.0 14.5 0.0 14.5 42.3 42.0 0.0 42.0  Max Dur (sec) 0.0 14.5 0.0 14.5 127.5 127.5 0.0 127.5  Total Dur (min) 0.0 0.2 0.0 0.2 65.5 65.8 0.0 65.8  % of TST 0.0 0.1 0.0 0.1 22.8 22.9 0.0 22.9  Index (#/h TST) 0.0 0.2 0.0 0.2 19.4 19.6 0.0 19.6  *Hypopneas scored based on 4% or greater desaturation.  Sleep Stage:         REM NREM TST  AHI 22.1 18.6 19.6  RDI 22.1 18.6 19.6   Sleep (min) TST (%) REM (min) NREM (min) CA (#) OA (#) MA (#) HYP (#) AHI (#/h) RERA (#) RDI (#/h) Desat (#)  Supine 213.5 74.26 64.5 149.0 0 1 0 93 26.4 0 26.4 133  Non-Supine 74.00 25.74 22.50 51.50 0.00 0.00 0.00 0.00 0.00 0 0.00 0.00  Right: 74.0 25.74 22.5 51.5 0 0 0 0 0.0 0 0.00 0     Snoring: Total number of snoring episodes  0  Total time with snoring    min (   % of sleep)   Oximetry Distribution:             WK REM NREM TOTAL  Average (%)   97 96 97 97  < 90% 0.5 2.7 0.6 3.8  < 80% 0.0 0.0 0.0 0.0  < 70% 0.0 0.0 0.0 0.0  # of Desaturations* 3 37 78 118  Desat Index (#/hour) 1.9 25.5 23.3 24.6  Desat Max (%) 6 12 10 12   Desat Max Dur (sec) 18.0 108.0 88.0 108.0  Approx Min O2 during sleep 80  Approx min O2 during a respiratory event 80  Was Oxygen added (Y/N) and final rate :     LPM  *Desaturations based on 3% or greater drop from baseline.   Cheyne Stokes Breathing: None Present    Heart Rate Summary:  Average Heart Rate During Sleep 52.6 bpm      Highest Heart Rate During Sleep (95th %) 147.0 bpm      Highest Heart Rate During Sleep 219 bpm (artifact)  Highest Heart Rate During Recording (TIB) 255 bpm (artifact)   Heart Rate Observations: Event Type # Events   Bradycardia 0 Lowest HR Scored: N/A  Sinus Tachycardia During Sleep 0 Highest HR Scored: N/A  Narrow Complex Tachycardia 0 Highest HR  Scored: N/A  Wide Complex Tachycardia 0 Highest HR Scored: N/A  Asystole 0 Longest Pause: N/A  Atrial Fibrillation 0 Duration Longest Event: N/A  Other Arrythmias   Type:   Periodic Limb Movement Data: (Primary legs unless otherwise noted) Total # Limb Movement 5 Limb Movement Index 1.0  Total # PLMS    PLMS Index     Total # PLMS Arousals    PLMS Arousal Index     Percentage Sleep Time with PLMS   min (   % sleep)  Mean Duration limb movements (secs)      Brief Summary:     Starting pressure:  cm of H2O Maximum Pressure:  cm of H2O  Mask Type:  Mask Size:   Humidifier used:  Chin Strap used:   CFlex:  BiFlex:      IPAP Level (cmH2O) EPAP Level (cmH2O) Total Duration (min) Sleep Duration (min) Sleep (%) REM (%) CA  #) OA # MA # HYP #) AHI (#/hr) RERAs # RERAs (#/hr) RDI (#/hr)  15 6 2.7 0.5 18.5 0.0 0 0 0 0 0.0 0 0.0 0.0  15 7 19.7 2.0 10.2 0.0 0 0 0 0 0.0 0 0.0 0.0  15 8 22.7 2.5 11.0 0.0 0 1 0 0 24.0 0 0.0 24.0  15 9 28.8 18.5 64.2 52.1 0 0 0 1 3.2 0 0.0 3.2  15 10  11.0 6.1 55.5 31.8 0 0 0 1 9.8 0 0.0 9.8  15 11  12.4 3.0 24.2 0.0 0 0 0 2 40.0 0 0.0 40.0  16 13 15.4 13.4 87.0 0.0 0 0 0 0 0.0 0 0.0 0.0  17 13 13.5 11.4 84.4 0.0 0 0 0 0 0.0 0 0.0 0.0  17 14 24.9 23.0 92.4 53.0 0 0 0 0 0.0 0 0.0 0.0  18 14 14.1 14.1 100.0 47.5 0 0 0 0 0.0 0 0.0 0.0  18 15 8.0 8.0 100.0 32.5 0 0 0 0 0.0 0 0.0 0.0  22 7 6.6 5.1 77.3 0.0 0 0 0 0 0.0 0 0.0 0.0  22 9 15.3  8.3 54.2 0.0 0 0 0 0 0.0 0 0.0 0.0  24 8 15.4 14.9 96.8 52.6 0 0 0 1 4.0 0 0.0 4.0  24 9 4.0 4.0 100.0 47.5 0 0 0 0 0.0 0 0.0 0.0  24 11 6.9 6.9 100.0 0.0 0 0 0 0 0.0 0 0.0 0.0  26 11 12.0 8.8 73.3 0.0 0 0 0 1 6.8 0 0.0 6.8  26 12  27.6 26.4 95.7 0.0 0 0 0 12 27.3 0 0.0 27.3  26 13  44.8 43.8 97.8 15.4 0 0 0 25 34.2 0 0.0 34.2  26 14  31.2 30.7 98.4 36.5 0 0 0 27 52.8 0 0.0 52.8  26 15  28.7 28.7 100.0 61.7 0 0 0 22 46.0 0 0.0 46.0

## 2022-12-02 ENCOUNTER — Telehealth: Payer: Self-pay | Admitting: Physician Assistant

## 2022-12-02 NOTE — Telephone Encounter (Signed)
Cpap cmn signed. Faxed back to [facility]; [phone]. Scanned-Toni

## 2022-12-13 ENCOUNTER — Encounter: Payer: Self-pay | Admitting: Physician Assistant

## 2022-12-13 ENCOUNTER — Telehealth: Payer: Self-pay | Admitting: Physician Assistant

## 2022-12-13 ENCOUNTER — Ambulatory Visit (INDEPENDENT_AMBULATORY_CARE_PROVIDER_SITE_OTHER): Payer: 59 | Admitting: Physician Assistant

## 2022-12-13 VITALS — BP 130/80 | HR 86 | Temp 97.6°F | Resp 16 | Ht 68.5 in | Wt 269.0 lb

## 2022-12-13 DIAGNOSIS — G4733 Obstructive sleep apnea (adult) (pediatric): Secondary | ICD-10-CM

## 2022-12-13 DIAGNOSIS — I10 Essential (primary) hypertension: Secondary | ICD-10-CM | POA: Diagnosis not present

## 2022-12-13 DIAGNOSIS — R229 Localized swelling, mass and lump, unspecified: Secondary | ICD-10-CM

## 2022-12-13 DIAGNOSIS — E538 Deficiency of other specified B group vitamins: Secondary | ICD-10-CM

## 2022-12-13 MED ORDER — CYANOCOBALAMIN 1000 MCG/ML IJ SOLN
1000.0000 ug | Freq: Once | INTRAMUSCULAR | Status: AC
Start: 2022-12-13 — End: 2022-12-13
  Administered 2022-12-13: 1000 ug via INTRAMUSCULAR

## 2022-12-13 NOTE — Telephone Encounter (Signed)
Cpap order faxed to [facility]; [phone]. Scanned-Toni

## 2022-12-13 NOTE — Progress Notes (Signed)
[facility] [address] [place], [place] [address]  Internal MEDICINE  Office Visit Note  Patient Name: Destiny Singh  [id number]  [id number]  Date of Service: 12/13/2022  Chief Complaint  Patient presents with   Follow-up    Review Sleep Study   Hypertension   Diabetes    HPI Pt is here for routine follow up -Doing well with trulicity, a little nausea at times -down 4lbs from last visit -Completed bipap Sv titration and will move forward with starting therapy. Order placed -had another place on back of head, previously had benign tumor? removed in-office procedure and is now back again in same spot. Causes some headaches. No signs of infection. Unclear if cyst. Would like referral to dermatology to remove again. -labs reviewed-b12 a little low and would like to restrat shots monthly, otherwise labs look good  Current Medication: Outpatient Encounter Medications as of 12/13/2022  Medication Sig   Ashwagandha 500 MG CAPS Take 500 mg by mouth daily as needed (energy).   cholecalciferol (VITAMIN D3) 25 MCG (1000 UNIT) tablet Take 1,000 Units by mouth daily.   cyanocobalamin (,VITAMIN B-12,) 1000 MCG/ML injection Inject 1,000 mcg into the muscle See admin instructions. Every 2 months   Dulaglutide (TRULICITY) 1.5 MG/0.5ML SOPN Inject 1.5 mg into the skin once a week.   gabapentin (NEURONTIN) 100 MG capsule Take 100 mg by mouth 3 (three) times daily.   ibuprofen (ADVIL) 800 MG tablet Take 1 tablet (800 mg total) by mouth every 8 (eight) hours as needed for mild pain.   losartan-hydrochlorothiazide (HYZAAR) 100-25 MG tablet Take 1 tablet by mouth daily.   metoprolol tartrate (LOPRESSOR) 50 MG tablet Take 1 tablet (50 mg total) by mouth 2 (two) times daily.   ondansetron (ZOFRAN) 4 MG tablet Take 1 tablet (4 mg total) by mouth every 8 (eight) hours as needed for nausea or vomiting.   [DISCONTINUED] Dulaglutide (TRULICITY) 1.5 MG/0.5ML SOPN Inject 1.5 mg into the skin once a  week.   [EXPIRED] cyanocobalamin (VITAMIN B12) injection 1,000 mcg    No facility-administered encounter medications on file as of 12/13/2022.    Surgical History: Past Surgical History:  Procedure Laterality Date   BREAST EXCISIONAL BIOPSY     in teens per pt   BUNIONECTOMY Right 10/01/2021   Procedure: BUNIONECTOMY - LAPIDUS - TYPE;  Surgeon: Rosetta Posner, DPM;  Location: [facility];  Service: Podiatry;  Laterality: Right;   CALCANEAL OSTEOTOMY Right 10/01/2021   Procedure: EVANS/MCDO;  Surgeon: Rosetta Posner, DPM;  Location: [facility];  Service: Podiatry;  Laterality: Right;   CHONDROPLASTY Right 04/01/2016   Procedure: CHONDROPLASTY -ABRASION OF FEMORAL TROCHLEA, LATERAL TIBIA Plateau;  Surgeon: Christena Flake, MD;  Location: [facility];  Service: Orthopedics;  Laterality: Right;   GASTROC RECESSION EXTREMITY Right 10/01/2021   Procedure: GASTROC RECESSION;  Surgeon: Rosetta Posner, DPM;  Location: [facility];  Service: Podiatry;  Laterality: Right;   KNEE ARTHROSCOPY Left 06/18/2020   Procedure: LEFT KNEE ARTHROSCOPY WITH DEBRIDEMENT AND PARTIAL MEDIAL MENISCECTOMY;  Surgeon: Christena Flake, MD;  Location: [facility];  Service: Orthopedics;  Laterality: Left;   KNEE ARTHROSCOPY WITH MENISCAL REPAIR Right 04/01/2016   Procedure: KNEE ARTHROSCOPY WITH PARTIAL MEDIAL  MENISCAL REPAIR;  Surgeon: Christena Flake, MD;  Location: [facility];  Service: Orthopedics;  Laterality: Right;   TENDON TRANSFER Right 10/01/2021   Procedure: FDL TRANSFER; DEEP;  Surgeon: Rosetta Posner, DPM;  Location: [facility];  Service: Podiatry;  Laterality: Right;   TENOTOMY / FLEXOR TENDON TRANSFER Right 10/01/2021   Procedure: FLEXOR TENDON REPAIR;  Surgeon: Rosetta Posner, DPM;  Location: [facility];  Service: Podiatry;  Laterality: Right;   TUBAL LIGATION     TUMOR EXCISION  2016   BENIGN-HEAD    Medical History: Past Medical History:  Diagnosis Date   Adopted    Arthritis     BOTH KNEES   Asthma    Benign tumor    Diabetes mellitus without complication (HCC)    Dyspnea    VERY RARE   Dysrhythmia    IRREGULAR PER PT   Headache    Hypertension    Myocardial infarction (HCC)    AGE 52    Family History: Family History  Adopted: Yes    Social History   Socioeconomic History   Marital status: Single    Spouse name: Not on file   Number of children: Not on file   Years of education: Not on file   Highest education level: Not on file  Occupational History   Not on file  Tobacco Use   Smoking status: Never   Smokeless tobacco: Never  Substance and Sexual Activity   Alcohol use: No   Drug use: No   Sexual activity: Not on file  Other Topics Concern   Not on file  Social History Narrative   Not on file   Social Determinants of Health   Financial Resource Strain: Not on file  Food Insecurity: Not on file  Transportation Needs: Not on file  Physical Activity: Not on file  Stress: Not on file  Social Connections: Not on file  Intimate Partner Violence: Not on file      Review of Systems  Constitutional:  Positive for fatigue. Negative for chills and unexpected weight change.  HENT:  Negative for congestion, postnasal drip, rhinorrhea, sneezing and sore throat.   Eyes:  Negative for redness.  Respiratory:  Negative for cough, chest tightness and shortness of breath.   Cardiovascular:  Negative for chest pain and palpitations.  Gastrointestinal:  Negative for abdominal pain, constipation, diarrhea, nausea and vomiting.  Genitourinary:  Negative for dysuria and frequency.  Musculoskeletal:  Positive for arthralgias. Negative for back pain, joint swelling and neck pain.  Skin:  Negative for rash.       Bump on back of head  Neurological:  Negative for tremors and numbness.  Hematological:  Negative for adenopathy. Does not bruise/bleed easily.  Psychiatric/Behavioral:  Positive for sleep disturbance. Negative for behavioral problems  (Depression) and suicidal ideas. The patient is not nervous/anxious.     Vital Signs: BP 130/80   Pulse 86   Temp 97.6 F (36.4 C)   Resp 16   Ht 5' 8.5" (1.74 m)   Wt 269 lb (122 kg)   LMP 11/23/2018 (Approximate)   SpO2 96%   BMI 40.31 kg/m    Physical Exam Vitals and nursing note reviewed.  Constitutional:      Appearance: Normal appearance.  HENT:     Head: Normocephalic and atraumatic.     Nose: Nose normal.     Mouth/Throat:     Mouth: Mucous membranes are moist.     Pharynx: No posterior oropharyngeal erythema.  Eyes:     Extraocular Movements: Extraocular movements intact.     Pupils: Pupils are equal, round, and reactive to light.  Cardiovascular:     Rate and Rhythm: Normal rate and regular rhythm.     Pulses: Normal  pulses.     Heart sounds: Normal heart sounds.  Pulmonary:     Effort: Pulmonary effort is normal.     Breath sounds: Normal breath sounds.  Skin:    General: Skin is warm and dry.     Findings: Lesion present.     Comments: Small lump on back of head about 1.5cmx1.5cm, mildly tender, no erythema or drainage  Neurological:     General: No focal deficit present.     Mental Status: She is alert.  Psychiatric:        Mood and Affect: Mood normal.        Behavior: Behavior normal.        Assessment/Plan: 1. OSA (obstructive sleep apnea) Will order Bipap per titration results and fax for DME company  2. Essential hypertension Well controlled, continue current medication  3. Localized skin mass, lump, or swelling Will refer to dermatology for excision again - Ambulatory referral to Dermatology  4. B12 deficiency - cyanocobalamin (VITAMIN B12) injection 1,000 mcg   General Counseling: Noemy verbalizes understanding of the findings of todays visit and agrees with plan of treatment. I have discussed any further diagnostic evaluation that may be needed or ordered today. We also reviewed her medications today. she has been encouraged to  call the office with any questions or concerns that should arise related to todays visit.    Orders Placed This Encounter  Procedures   Ambulatory referral to Dermatology    Meds ordered this encounter  Medications   cyanocobalamin (VITAMIN B12) injection 1,000 mcg    This patient was seen by Lynn Ito, PA-C in collaboration with Dr. Beverely Risen as a part of collaborative care agreement.   Total time spent:30 Minutes Time spent includes review of chart, medications, test results, and follow up plan with the patient.      Dr Lyndon Code Internal medicine

## 2022-12-21 ENCOUNTER — Ambulatory Visit: Payer: 59

## 2022-12-22 ENCOUNTER — Ambulatory Visit (INDEPENDENT_AMBULATORY_CARE_PROVIDER_SITE_OTHER): Payer: 59

## 2022-12-22 DIAGNOSIS — E538 Deficiency of other specified B group vitamins: Secondary | ICD-10-CM | POA: Diagnosis not present

## 2022-12-22 MED ORDER — CYANOCOBALAMIN 1000 MCG/ML IJ SOLN
1000.0000 ug | Freq: Once | INTRAMUSCULAR | Status: AC
Start: 2022-12-22 — End: 2022-12-22
  Administered 2022-12-22: 1000 ug via INTRAMUSCULAR

## 2022-12-27 DIAGNOSIS — G629 Polyneuropathy, unspecified: Secondary | ICD-10-CM | POA: Diagnosis not present

## 2022-12-27 DIAGNOSIS — M79671 Pain in right foot: Secondary | ICD-10-CM | POA: Diagnosis not present

## 2022-12-27 DIAGNOSIS — R202 Paresthesia of skin: Secondary | ICD-10-CM | POA: Diagnosis not present

## 2022-12-27 DIAGNOSIS — R200 Anesthesia of skin: Secondary | ICD-10-CM | POA: Diagnosis not present

## 2022-12-28 ENCOUNTER — Ambulatory Visit (INDEPENDENT_AMBULATORY_CARE_PROVIDER_SITE_OTHER): Payer: 59

## 2022-12-28 ENCOUNTER — Other Ambulatory Visit: Payer: Self-pay | Admitting: Physician Assistant

## 2022-12-28 ENCOUNTER — Telehealth: Payer: Self-pay

## 2022-12-28 DIAGNOSIS — E538 Deficiency of other specified B group vitamins: Secondary | ICD-10-CM

## 2022-12-28 DIAGNOSIS — E119 Type 2 diabetes mellitus without complications: Secondary | ICD-10-CM

## 2022-12-28 MED ORDER — CYANOCOBALAMIN 1000 MCG/ML IJ SOLN
1000.0000 ug | Freq: Once | INTRAMUSCULAR | Status: AC
Start: 2022-12-28 — End: 2022-12-28
  Administered 2022-12-28: 1000 ug via INTRAMUSCULAR

## 2022-12-28 MED ORDER — TRULICITY 3 MG/0.5ML ~~LOC~~ SOAJ
3.0000 mg | SUBCUTANEOUS | 2 refills | Status: DC
Start: 2022-12-28 — End: 2023-02-09

## 2022-12-28 NOTE — Telephone Encounter (Signed)
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

## 2023-01-26 ENCOUNTER — Ambulatory Visit: Payer: 59 | Admitting: Dermatology

## 2023-02-01 ENCOUNTER — Ambulatory Visit: Payer: 59 | Admitting: Dermatology

## 2023-02-07 ENCOUNTER — Telehealth: Payer: Self-pay

## 2023-02-07 NOTE — Telephone Encounter (Signed)
Left message for patient to give office a call back

## 2023-02-09 ENCOUNTER — Other Ambulatory Visit: Payer: Self-pay

## 2023-02-09 DIAGNOSIS — E119 Type 2 diabetes mellitus without complications: Secondary | ICD-10-CM

## 2023-02-09 MED ORDER — TRULICITY 3 MG/0.5ML ~~LOC~~ SOAJ: 3.0000 mg | SUBCUTANEOUS | 2 refills | Status: DC

## 2023-02-09 NOTE — Telephone Encounter (Signed)
Spoke to patient and patient stated they needed a new order with the 3mg  so I sent it to her pharmacy

## 2023-02-17 ENCOUNTER — Ambulatory Visit (INDEPENDENT_AMBULATORY_CARE_PROVIDER_SITE_OTHER): Payer: 59 | Admitting: Physician Assistant

## 2023-02-17 ENCOUNTER — Encounter: Payer: Self-pay | Admitting: Physician Assistant

## 2023-02-17 VITALS — BP 115/80 | HR 89 | Temp 98.1°F | Resp 16 | Ht 68.5 in | Wt 273.0 lb

## 2023-02-17 DIAGNOSIS — E119 Type 2 diabetes mellitus without complications: Secondary | ICD-10-CM

## 2023-02-17 DIAGNOSIS — R109 Unspecified abdominal pain: Secondary | ICD-10-CM

## 2023-02-17 DIAGNOSIS — Z1212 Encounter for screening for malignant neoplasm of rectum: Secondary | ICD-10-CM

## 2023-02-17 DIAGNOSIS — G4733 Obstructive sleep apnea (adult) (pediatric): Secondary | ICD-10-CM | POA: Diagnosis not present

## 2023-02-17 DIAGNOSIS — I10 Essential (primary) hypertension: Secondary | ICD-10-CM

## 2023-02-17 DIAGNOSIS — Z1211 Encounter for screening for malignant neoplasm of colon: Secondary | ICD-10-CM

## 2023-02-17 DIAGNOSIS — K625 Hemorrhage of anus and rectum: Secondary | ICD-10-CM

## 2023-02-17 DIAGNOSIS — Z1231 Encounter for screening mammogram for malignant neoplasm of breast: Secondary | ICD-10-CM | POA: Diagnosis not present

## 2023-02-17 LAB — POCT GLYCOSYLATED HEMOGLOBIN (HGB A1C): Hemoglobin A1C: 6.3 % — AB (ref 4.0–5.6)

## 2023-02-17 NOTE — Progress Notes (Signed)
[facility] [address] [place], [place] [address]  Internal MEDICINE  Office Visit Note  Patient Name: Destiny Singh  [id number]  [id number]  Date of Service: 02/17/2023  Chief Complaint  Patient presents with   Follow-up   Diabetes   Hypertension   Quality Metric Gaps    Mammogram and Colonoscopy    HPI Pt is here for routine follow up -BP stable -Has started on Bipap now and is doing well with this. She is using nightly and benefiting from use. Has only been on for about month or so, but seeing improvement. Wearing hybrid FF mask and likes this. Uses [employer] for supplies.  -trouble getting trulicity increased dose and may need new PA for this. States they will fill 1.5 in meantime if needed and will check on status -still needs mammogram and colonoscopy. Does state she had a little blood on TP recently. None in stool and no dark/tarry stool. A little left side abdominal pain/cramping at times. States she was told she may have a hernia previously near LUQ, but none palpated. Will get a little LLQ pain at times as well. She is willing to move forward with colonoscopy/GI consult given these changes, but will call if any new or worsening symptoms prior. Discussed avoiding NSAIDs -Had to reschedule dermatology appt and is working on new appt time  Current Medication: Outpatient Encounter Medications as of 02/17/2023  Medication Sig   Ashwagandha 500 MG CAPS Take 500 mg by mouth daily as needed (energy).   cholecalciferol (VITAMIN D3) 25 MCG (1000 UNIT) tablet Take 1,000 Units by mouth daily.   cyanocobalamin (,VITAMIN B-12,) 1000 MCG/ML injection Inject 1,000 mcg into the muscle See admin instructions. Every 2 months   Dulaglutide (TRULICITY) 3 MG/0.5ML SOAJ Inject 3 mg as directed once a week.   ibuprofen (ADVIL) 800 MG tablet Take 1 tablet (800 mg total) by mouth every 8 (eight) hours as needed for mild pain.   losartan-hydrochlorothiazide (HYZAAR) 100-25 MG tablet Take 1  tablet by mouth daily.   metoprolol tartrate (LOPRESSOR) 50 MG tablet Take 1 tablet (50 mg total) by mouth 2 (two) times daily.   nortriptyline (PAMELOR) 50 MG capsule Take 50 mg by mouth at bedtime.   ondansetron (ZOFRAN) 4 MG tablet Take 1 tablet (4 mg total) by mouth every 8 (eight) hours as needed for nausea or vomiting.   [DISCONTINUED] gabapentin (NEURONTIN) 100 MG capsule Take 100 mg by mouth 3 (three) times daily.   No facility-administered encounter medications on file as of 02/17/2023.    Surgical History: Past Surgical History:  Procedure Laterality Date   BREAST EXCISIONAL BIOPSY     in teens per pt   BUNIONECTOMY Right 10/01/2021   Procedure: BUNIONECTOMY - LAPIDUS - TYPE;  Surgeon: Rosetta Posner, DPM;  Location: [facility];  Service: Podiatry;  Laterality: Right;   CALCANEAL OSTEOTOMY Right 10/01/2021   Procedure: EVANS/MCDO;  Surgeon: Rosetta Posner, DPM;  Location: [facility];  Service: Podiatry;  Laterality: Right;   CHONDROPLASTY Right 04/01/2016   Procedure: CHONDROPLASTY -ABRASION OF FEMORAL TROCHLEA, LATERAL TIBIA Plateau;  Surgeon: Christena Flake, MD;  Location: [facility];  Service: Orthopedics;  Laterality: Right;   GASTROC RECESSION EXTREMITY Right 10/01/2021   Procedure: GASTROC RECESSION;  Surgeon: Rosetta Posner, DPM;  Location: [facility];  Service: Podiatry;  Laterality: Right;   KNEE ARTHROSCOPY Left 06/18/2020   Procedure: LEFT KNEE ARTHROSCOPY WITH DEBRIDEMENT AND PARTIAL MEDIAL MENISCECTOMY;  Surgeon: Christena Flake, MD;  Location: [facility];  Service: Orthopedics;  Laterality: Left;   KNEE ARTHROSCOPY WITH MENISCAL REPAIR Right 04/01/2016   Procedure: KNEE ARTHROSCOPY WITH PARTIAL MEDIAL  MENISCAL REPAIR;  Surgeon: Christena Flake, MD;  Location: [facility];  Service: Orthopedics;  Laterality: Right;   TENDON TRANSFER Right 10/01/2021   Procedure: FDL TRANSFER; DEEP;  Surgeon: Rosetta Posner, DPM;  Location: [facility];  Service:  Podiatry;  Laterality: Right;   TENOTOMY / FLEXOR TENDON TRANSFER Right 10/01/2021   Procedure: FLEXOR TENDON REPAIR;  Surgeon: Rosetta Posner, DPM;  Location: [facility];  Service: Podiatry;  Laterality: Right;   TUBAL LIGATION     TUMOR EXCISION  2016   BENIGN-HEAD    Medical History: Past Medical History:  Diagnosis Date   Adopted    Arthritis    BOTH KNEES   Asthma    Benign tumor    Diabetes mellitus without complication (HCC)    Dyspnea    VERY RARE   Dysrhythmia    IRREGULAR PER PT   Headache    Hypertension    Myocardial infarction (HCC)    AGE 52    Family History: Family History  Adopted: Yes    Social History   Socioeconomic History   Marital status: Single    Spouse name: Not on file   Number of children: Not on file   Years of education: Not on file   Highest education level: Not on file  Occupational History   Not on file  Tobacco Use   Smoking status: Never   Smokeless tobacco: Never  Substance and Sexual Activity   Alcohol use: No   Drug use: No   Sexual activity: Not on file  Other Topics Concern   Not on file  Social History Narrative   Not on file   Social Drivers of Health   Financial Resource Strain: Not on file  Food Insecurity: Not on file  Transportation Needs: Not on file  Physical Activity: Not on file  Stress: Not on file  Social Connections: Not on file  Intimate Partner Violence: Not on file      Review of Systems  Constitutional:  Positive for fatigue. Negative for chills and unexpected weight change.  HENT:  Negative for congestion, postnasal drip, rhinorrhea, sneezing and sore throat.   Eyes:  Negative for redness.  Respiratory:  Negative for cough, chest tightness and shortness of breath.   Cardiovascular:  Negative for chest pain and palpitations.  Gastrointestinal:  Positive for abdominal pain and anal bleeding. Negative for blood in stool, constipation, diarrhea, nausea and vomiting.  Genitourinary:   Negative for dysuria and frequency.  Musculoskeletal:  Positive for arthralgias. Negative for back pain, joint swelling and neck pain.  Skin:  Negative for rash.  Neurological:  Negative for tremors and numbness.  Hematological:  Negative for adenopathy. Does not bruise/bleed easily.  Psychiatric/Behavioral:  Negative for behavioral problems (Depression) and suicidal ideas. The patient is not nervous/anxious.     Vital Signs: BP 115/80   Pulse 89   Temp 98.1 F (36.7 C)   Resp 16   Ht 5' 8.5" (1.74 m)   Wt 273 lb (123.8 kg)   LMP 11/23/2018 (Approximate)   SpO2 96%   BMI 40.91 kg/m    Physical Exam Vitals and nursing note reviewed.  Constitutional:      Appearance: Normal appearance.  HENT:     Head: Normocephalic and atraumatic.     Nose: Nose normal.  Mouth/Throat:     Mouth: Mucous membranes are moist.     Pharynx: No posterior oropharyngeal erythema.  Eyes:     Extraocular Movements: Extraocular movements intact.     Pupils: Pupils are equal, round, and reactive to light.  Cardiovascular:     Rate and Rhythm: Normal rate and regular rhythm.     Pulses: Normal pulses.     Heart sounds: Normal heart sounds.  Pulmonary:     Effort: Pulmonary effort is normal.     Breath sounds: Normal breath sounds.  Abdominal:     Tenderness: There is abdominal tenderness. There is no guarding or rebound.     Hernia: No hernia is present.     Comments: Mild generalized tenderness along left side  Skin:    General: Skin is warm and dry.  Neurological:     General: No focal deficit present.     Mental Status: She is alert.  Psychiatric:        Mood and Affect: Mood normal.        Behavior: Behavior normal.        Assessment/Plan: 1. Type 2 diabetes mellitus without complication, without long-term current use of insulin (HCC) (Primary) - Urine Microalbumin w/creat. ratio - POCT HgB A1C is 6.3 which is improved from previous 6.5. Will work on [place] for [employer]  dose  2. Essential hypertension Improved, continue current medication  3. OSA (obstructive sleep apnea) Continue Bipap nightly  4. Visit for screening mammogram - MM 3D SCREENING MAMMOGRAM BILATERAL BREAST; Future  5. Encounter for colorectal cancer screening - Ambulatory referral to Gastroenterology  6. BRBPR (bright red blood per rectum) - Ambulatory referral to Gastroenterology  7. Left sided abdominal pain Intermittent discomfort/cramping along left side, will monitor and call if new or worsening symptoms   General Counseling: Yesmin verbalizes understanding of the findings of todays visit and agrees with plan of treatment. I have discussed any further diagnostic evaluation that may be needed or ordered today. We also reviewed her medications today. she has been encouraged to call the office with any questions or concerns that should arise related to todays visit.    Orders Placed This Encounter  Procedures   MM 3D SCREENING MAMMOGRAM BILATERAL BREAST   Urine Microalbumin w/creat. ratio   Ambulatory referral to Gastroenterology   POCT HgB A1C    No orders of the defined types were placed in this encounter.   This patient was seen by Lynn Ito, PA-C in collaboration with Dr. Beverely Risen as a part of collaborative care agreement.   Total time spent:30 Minutes Time spent includes review of chart, medications, test results, and follow up plan with the patient.      Dr Lyndon Code Internal medicine

## 2023-03-20 ENCOUNTER — Other Ambulatory Visit: Payer: Self-pay | Admitting: Physician Assistant

## 2023-03-20 DIAGNOSIS — I10 Essential (primary) hypertension: Secondary | ICD-10-CM

## 2023-03-21 ENCOUNTER — Telehealth: Payer: Self-pay

## 2023-03-21 ENCOUNTER — Other Ambulatory Visit: Payer: Self-pay | Admitting: Physician Assistant

## 2023-03-21 DIAGNOSIS — E119 Type 2 diabetes mellitus without complications: Secondary | ICD-10-CM

## 2023-03-21 NOTE — Telephone Encounter (Signed)
Completed P.A. for patient's Trulicity.

## 2023-03-22 ENCOUNTER — Ambulatory Visit
Admission: RE | Admit: 2023-03-22 | Discharge: 2023-03-22 | Disposition: A | Discharge status: Home / Self Care | Payer: 59 | Source: Ambulatory Visit | Attending: Physician Assistant | Admitting: Physician Assistant

## 2023-03-22 DIAGNOSIS — Z1231 Encounter for screening mammogram for malignant neoplasm of breast: Secondary | ICD-10-CM | POA: Diagnosis not present

## 2023-04-18 ENCOUNTER — Other Ambulatory Visit: Payer: Self-pay

## 2023-04-19 NOTE — Progress Notes (Unsigned)
Celso Amy, PA-C [address]  [address]  [place], [place] [address]  Main: [phone]  Fax: [phone]   Gastroenterology Consultation  Referring Provider:     Carlean Jews, PA* Primary Care Physician:  Carlean Jews, PA-C Primary Gastroenterologist:  Celso Amy, PA-C  Reason for Consultation:     Rectal bleeding        HPI:   Huda R Rentfrow is a 53 y.o. y/o female referred for consultation & management  by Carlean Jews, PA-C.    She had an episode of rectal bleeding First week of January 2025.  The episode lasted 1 week and resolved.  She saw BRB on tissue and in toilet.  No current bleeding.  No rectal pain or history of hemorrhoids.  She admits to mild occasional constipation.  Typically has BM once daily which is occasionally hard.  Denies diarrhea or weight loss.  Has occsional peri-umbilical abdominal pain.  No previous GI evaluation or colonoscopy.  She is adopted.  Family history unknown.  11/2022 labs: Normal CBC, CMP, TSH, free T4, iron panel, B12, and folate.  Hemoglobin 13.2.  Past Medical History:  Diagnosis Date   Adopted    Arthritis    BOTH KNEES   Asthma    Benign tumor    Diabetes mellitus without complication (HCC)    Dyspnea    VERY RARE   Dysrhythmia    IRREGULAR PER PT   Headache    Hypertension    Myocardial infarction (HCC)    AGE 57   Sickle cell trait (HCC)     Past Surgical History:  Procedure Laterality Date   BREAST EXCISIONAL BIOPSY     in teens per pt   BUNIONECTOMY Right 10/01/2021   Procedure: BUNIONECTOMY - LAPIDUS - TYPE;  Surgeon: Rosetta Posner, DPM;  Location: [facility];  Service: Podiatry;  Laterality: Right;   CALCANEAL OSTEOTOMY Right 10/01/2021   Procedure: EVANS/MCDO;  Surgeon: Rosetta Posner, DPM;  Location: [facility];  Service: Podiatry;  Laterality: Right;   CHONDROPLASTY Right 04/01/2016   Procedure: CHONDROPLASTY -ABRASION OF FEMORAL TROCHLEA, LATERAL TIBIA [place];   Surgeon: Christena Flake, MD;  Location: [facility];  Service: Orthopedics;  Laterality: Right;   GASTROC RECESSION EXTREMITY Right 10/01/2021   Procedure: GASTROC RECESSION;  Surgeon: Rosetta Posner, DPM;  Location: [facility];  Service: Podiatry;  Laterality: Right;   KNEE ARTHROSCOPY Left 06/18/2020   Procedure: LEFT KNEE ARTHROSCOPY WITH DEBRIDEMENT AND PARTIAL MEDIAL MENISCECTOMY;  Surgeon: Christena Flake, MD;  Location: [facility];  Service: Orthopedics;  Laterality: Left;   KNEE ARTHROSCOPY WITH MENISCAL REPAIR Right 04/01/2016   Procedure: KNEE ARTHROSCOPY WITH PARTIAL MEDIAL  MENISCAL REPAIR;  Surgeon: Christena Flake, MD;  Location: [facility];  Service: Orthopedics;  Laterality: Right;   TENDON TRANSFER Right 10/01/2021   Procedure: FDL TRANSFER; DEEP;  Surgeon: Rosetta Posner, DPM;  Location: [facility];  Service: Podiatry;  Laterality: Right;   TENOTOMY / FLEXOR TENDON TRANSFER Right 10/01/2021   Procedure: FLEXOR TENDON REPAIR;  Surgeon: Rosetta Posner, DPM;  Location: [facility];  Service: Podiatry;  Laterality: Right;   TUBAL LIGATION     TUMOR EXCISION  2016   BENIGN-HEAD    Prior to Admission medications   Medication Sig Start Date End Date Taking? Authorizing Provider  ASHWAGANDHA 35 PO Take by mouth.    [provider]  Ashwagandha 500 MG CAPS Take 500 mg by mouth daily as  needed (energy).    [provider]  cholecalciferol (VITAMIN D3) 25 MCG (1000 UNIT) tablet Take 1,000 Units by mouth daily.    [provider]  cyanocobalamin (,VITAMIN B-12,) 1000 MCG/ML injection Inject 1,000 mcg into the muscle See admin instructions. Every 2 months    [provider]  Dulaglutide (TRULICITY) 3 MG/0.5ML SOAJ Inject 3 mg as directed once a week. 02/09/23   McDonough, Salomon Fick, PA-C  ERGOCALCIFEROL PO Take by mouth. 04/27/22   [provider]  ibuprofen (ADVIL) 800 MG tablet Take 1 tablet (800 mg total) by mouth every 8 (eight) hours  as needed for mild pain. 10/17/20   Lyndon Code, MD  losartan-hydrochlorothiazide [facility]) 100-25 MG tablet Take 1 tablet by mouth daily. 06/28/22   McDonough, Salomon Fick, PA-C  metoprolol tartrate (LOPRESSOR) 50 MG tablet TAKE 1 TABLET BY MOUTH TWICE A Koehler 03/21/23   McDonough, Lauren K, PA-C  nortriptyline (PAMELOR) 50 MG capsule Take 50 mg by mouth at bedtime.    [provider]  ondansetron (ZOFRAN) 4 MG tablet Take 1 tablet (4 mg total) by mouth every 8 (eight) hours as needed for nausea or vomiting. 10/01/21   Rosetta Posner, DPM    Family History  Adopted: Yes     Social History   Tobacco Use   Smoking status: Never   Smokeless tobacco: Never  Substance Use Topics   Alcohol use: No   Drug use: No    Allergies as of 04/20/2023 - Review Complete 04/20/2023  Allergen Reaction Noted   Bee venom Anaphylaxis and Swelling 06/17/2015   Other Swelling and Anaphylaxis 07/28/2015   Tramadol Other (See Comments) 08/07/2021    Review of Systems:    All systems reviewed and negative except where noted in HPI.   Physical Exam:  BP 138/84   Pulse (!) 102   Temp 97.9 F (36.6 C)   Ht 5' 8.5" (1.74 m)   Wt 275 lb 3.2 oz (124.8 kg)   LMP 11/23/2018 (Approximate)   BMI 41.24 kg/m  Patient's last menstrual period was 11/23/2018 (approximate).  General:   Alert,  Well-developed, well-nourished, pleasant and cooperative in NAD Lungs:  Respirations even and unlabored.  Clear throughout to auscultation.   No wheezes, crackles, or rhonchi. No acute distress. Heart:  Regular rate and rhythm; no murmurs, clicks, rubs, or gallops. Abdomen:  Normal bowel sounds.  No bruits.  Soft, and obese without masses, hepatosplenomegaly.  Mild tenderness to the left of umbilicus.  Rest of abdomen is not tender.  No guarding or rebound tenderness.    Neurologic:  Alert and oriented x3;  grossly normal neurologically. Psych:  Alert and cooperative. Normal mood and affect.  Imaging Studies: MM 3D  SCREENING MAMMOGRAM BILATERAL BREAST Result Date: 03/22/2023 CLINICAL DATA:  Screening. EXAM: DIGITAL SCREENING BILATERAL MAMMOGRAM WITH TOMOSYNTHESIS AND CAD TECHNIQUE: Bilateral screening digital craniocaudal and mediolateral oblique mammograms were obtained. Bilateral screening digital breast tomosynthesis was performed. The images were evaluated with computer-aided detection. COMPARISON:  Previous exam(s). ACR Breast Density Category b: There are scattered areas of fibroglandular density. FINDINGS: There are no findings suspicious for malignancy. IMPRESSION: No mammographic evidence of malignancy. A result letter of this screening mammogram will be mailed directly to the patient. RECOMMENDATION: Screening mammogram in one year. (Code:SM-B-01Y) BI-RADS CATEGORY  1: Negative. Electronically Signed   By: Sherron Ales M.D.   On: 03/22/2023 17:10    Assessment and Plan:   William R Spear is a 53 y.o. y/o  female has been referred for:   1.  Rectal bleeding: Episode in January for 1 week; Currently resolved.  No previous Colonoscopy.  Lab: CBC  Scheduling Colonoscopy I discussed risks of colonoscopy with patient to include risk of bleeding, colon perforation, and risk of sedation.  Patient expressed understanding and agrees to proceed with colonoscopy.   2.  Mild Constipation  Start OTC Miralax Mix 1 capful in a drink daily. Recommend High Fiber diet with fruits, vegetables, and whole grains. Drink 64 ounces of Fluids Daily.  Follow up After Colonoscopy with TG.  Celso Amy, PA-C

## 2023-04-20 ENCOUNTER — Other Ambulatory Visit: Payer: Self-pay | Admitting: Physician Assistant

## 2023-04-20 ENCOUNTER — Ambulatory Visit (INDEPENDENT_AMBULATORY_CARE_PROVIDER_SITE_OTHER): Payer: 59 | Admitting: Physician Assistant

## 2023-04-20 ENCOUNTER — Encounter: Payer: Self-pay | Admitting: Physician Assistant

## 2023-04-20 VITALS — BP 138/84 | HR 102 | Temp 97.9°F | Ht 68.5 in | Wt 275.2 lb

## 2023-04-20 DIAGNOSIS — K625 Hemorrhage of anus and rectum: Secondary | ICD-10-CM

## 2023-04-20 DIAGNOSIS — K5900 Constipation, unspecified: Secondary | ICD-10-CM

## 2023-04-20 DIAGNOSIS — E119 Type 2 diabetes mellitus without complications: Secondary | ICD-10-CM

## 2023-04-20 MED ORDER — PEG 3350-KCL-NA BICARB-NACL 420 G PO SOLR: 4000.0000 mL | Freq: Once | ORAL | 0 refills | Status: AC

## 2023-04-20 NOTE — Patient Instructions (Addendum)
For constipation: Start OTC Miralax Powder Mix 1 capful in 6 to 8 ounces of a drink once daily  Recommend high-fiber diet, 30 g of fiber daily Eat fruits, vegetables, and whole grains Drink 64 ounces of water / fluids daily.

## 2023-05-04 DIAGNOSIS — K625 Hemorrhage of anus and rectum: Secondary | ICD-10-CM | POA: Diagnosis not present

## 2023-05-05 ENCOUNTER — Encounter: Payer: Self-pay | Admitting: Physician Assistant

## 2023-05-05 LAB — CBC WITH DIFFERENTIAL/PLATELET
Basophils Absolute: 0 10*3/uL (ref 0.0–0.2)
Basos: 1 %
EOS (ABSOLUTE): 0.1 10*3/uL (ref 0.0–0.4)
Eos: 2 %
Hematocrit: 43 % (ref 34.0–46.6)
Hemoglobin: 13.7 g/dL (ref 11.1–15.9)
Immature Grans (Abs): 0 10*3/uL (ref 0.0–0.1)
Immature Granulocytes: 0 %
Lymphocytes Absolute: 3.3 10*3/uL — ABNORMAL HIGH (ref 0.7–3.1)
Lymphs: 40 %
MCH: 25.7 pg — ABNORMAL LOW (ref 26.6–33.0)
MCHC: 31.9 g/dL (ref 31.5–35.7)
MCV: 81 fL (ref 79–97)
Monocytes Absolute: 0.4 10*3/uL (ref 0.1–0.9)
Monocytes: 5 %
Neutrophils Absolute: 4.4 10*3/uL (ref 1.4–7.0)
Neutrophils: 52 %
Platelets: 404 10*3/uL (ref 150–450)
RBC: 5.33 x10E6/uL — ABNORMAL HIGH (ref 3.77–5.28)
RDW: 16.1 % — ABNORMAL HIGH (ref 11.7–15.4)
WBC: 8.3 10*3/uL (ref 3.4–10.8)

## 2023-05-09 DIAGNOSIS — M79671 Pain in right foot: Secondary | ICD-10-CM | POA: Diagnosis not present

## 2023-05-09 DIAGNOSIS — G629 Polyneuropathy, unspecified: Secondary | ICD-10-CM | POA: Diagnosis not present

## 2023-05-20 ENCOUNTER — Other Ambulatory Visit: Payer: Self-pay | Admitting: Physician Assistant

## 2023-05-20 DIAGNOSIS — E119 Type 2 diabetes mellitus without complications: Secondary | ICD-10-CM

## 2023-05-24 ENCOUNTER — Telehealth: Payer: Self-pay

## 2023-05-24 NOTE — Telephone Encounter (Signed)
 Patient states she needs to reschedule her colonoscopy for tomorrow because she has gotten sick.

## 2023-05-25 ENCOUNTER — Telehealth: Payer: Self-pay | Admitting: Physician Assistant

## 2023-05-25 ENCOUNTER — Telehealth (INDEPENDENT_AMBULATORY_CARE_PROVIDER_SITE_OTHER): Admitting: Nurse Practitioner

## 2023-05-25 ENCOUNTER — Encounter: Payer: Self-pay | Admitting: Nurse Practitioner

## 2023-05-25 VITALS — Ht 68.0 in | Wt 265.0 lb

## 2023-05-25 DIAGNOSIS — R051 Acute cough: Secondary | ICD-10-CM | POA: Diagnosis not present

## 2023-05-25 DIAGNOSIS — J069 Acute upper respiratory infection, unspecified: Secondary | ICD-10-CM

## 2023-05-25 DIAGNOSIS — U071 COVID-19: Secondary | ICD-10-CM

## 2023-05-25 DIAGNOSIS — R062 Wheezing: Secondary | ICD-10-CM | POA: Diagnosis not present

## 2023-05-25 MED ORDER — NIRMATRELVIR/RITONAVIR (PAXLOVID)TABLET
3.0000 | ORAL_TABLET | Freq: Two times a day (BID) | ORAL | 0 refills | Status: AC
Start: 2023-05-25 — End: 2023-05-30

## 2023-05-25 MED ORDER — PREDNISONE 10 MG (21) PO TBPK: ORAL_TABLET | ORAL | 0 refills | Status: DC

## 2023-05-25 MED ORDER — HYDROCOD POLI-CHLORPHE POLI ER 10-8 MG/5ML PO SUER: 5.0000 mL | Freq: Two times a day (BID) | ORAL | 0 refills | Status: DC | PRN

## 2023-05-25 NOTE — Telephone Encounter (Signed)
 Work note faxed to [phone] per patient request. Destiny Singh

## 2023-05-25 NOTE — Progress Notes (Signed)
 [facility] [address] [place], [place] [address]  Internal MEDICINE  Telephone Visit  Patient Name: Destiny Singh  [id number]  [id number]  Date of Service: 05/25/2023  I connected with the patient at 1520 by telephone and verified the patients identity using two identifiers.   I discussed the limitations, risks, security and privacy concerns of performing an evaluation and management service by telephone and the availability of in person appointments. I also discussed with the patient that there may be a patient responsible charge related to the service.  The patient expressed understanding and agrees to proceed.    Chief Complaint  Patient presents with   Telephone Screen   Telephone Assessment    Symptoms Start Sunday covid positive    Sinusitis   Sore Throat   Headache    Sinusitis Associated symptoms include congestion, coughing, headaches, shortness of breath, sinus pressure and a sore throat. Pertinent negatives include no chills or ear pain.  Sore Throat  Associated symptoms include congestion, coughing, headaches and shortness of breath. Pertinent negatives include no ear pain.  Headache  Associated symptoms include coughing, rhinorrhea, sinus pressure and a sore throat. Pertinent negatives include no ear pain or fever.   Destiny Singh presents for a telehealth virtual visit for covid positive URI Symptoms onset was Sunday Covid test was positive Reports Cough, SOB, chest tightness, wheezing, nasal congestion, runny nose, sinus pressure, headache, fatigue, body aches.   Current Medication: Outpatient Encounter Medications as of 05/25/2023  Medication Sig   ASHWAGANDHA 35 PO Take by mouth.   chlorpheniramine-HYDROcodone (TUSSIONEX) 10-8 MG/5ML Take 5 mLs by mouth every 12 (twelve) hours as needed for cough.   cholecalciferol (VITAMIN D3) 25 MCG (1000 UNIT) tablet Take 1,000 Units by mouth daily.   cyanocobalamin (,VITAMIN B-12,) 1000 MCG/ML injection Inject 1,000  mcg into the muscle See admin instructions. Every 2 months   Dulaglutide (TRULICITY) 3 MG/0.5ML SOAJ Inject 3 mg as directed once a week.   ERGOCALCIFEROL PO Take by mouth.   ibuprofen (ADVIL) 800 MG tablet Take 1 tablet (800 mg total) by mouth every 8 (eight) hours as needed for mild pain.   metoprolol tartrate (LOPRESSOR) 50 MG tablet TAKE 1 TABLET BY MOUTH TWICE A [place]ki   nirmatrelvir/ritonavir (PAXLOVID) 20 x 150 MG & 10 x 100MG  TABS Take 3 tablets by mouth 2 (two) times daily for 5 days.   nortriptyline (PAMELOR) 50 MG capsule Take 50 mg by mouth at bedtime.   predniSONE (STERAPRED UNI-PAK 21 TAB) 10 MG (21) TBPK tablet Use as directed for 6 days   No facility-administered encounter medications on file as of 05/25/2023.    Surgical History: Past Surgical History:  Procedure Laterality Date   BREAST EXCISIONAL BIOPSY     in teens per pt   BUNIONECTOMY Right 10/01/2021   Procedure: BUNIONECTOMY - LAPIDUS - TYPE;  Surgeon: Rosetta Posner, DPM;  Location: [facility];  Service: Podiatry;  Laterality: Right;   CALCANEAL OSTEOTOMY Right 10/01/2021   Procedure: EVANS/MCDO;  Surgeon: Rosetta Posner, DPM;  Location: [facility];  Service: Podiatry;  Laterality: Right;   CHONDROPLASTY Right 04/01/2016   Procedure: CHONDROPLASTY -ABRASION OF FEMORAL TROCHLEA, LATERAL TIBIA [place];  Surgeon: Christena Flake, MD;  Location: [facility];  Service: Orthopedics;  Laterality: Right;   GASTROC RECESSION EXTREMITY Right 10/01/2021   Procedure: GASTROC RECESSION;  Surgeon: Rosetta Posner, DPM;  Location: [facility];  Service: Podiatry;  Laterality: Right;   KNEE ARTHROSCOPY Left 06/18/2020  Procedure: LEFT KNEE ARTHROSCOPY WITH DEBRIDEMENT AND PARTIAL MEDIAL MENISCECTOMY;  Surgeon: Christena Flake, MD;  Location: [facility];  Service: Orthopedics;  Laterality: Left;   KNEE ARTHROSCOPY WITH MENISCAL REPAIR Right 04/01/2016   Procedure: KNEE ARTHROSCOPY WITH PARTIAL MEDIAL  MENISCAL REPAIR;   Surgeon: Christena Flake, MD;  Location: [facility];  Service: Orthopedics;  Laterality: Right;   TENDON TRANSFER Right 10/01/2021   Procedure: FDL TRANSFER; DEEP;  Surgeon: Rosetta Posner, DPM;  Location: [facility];  Service: Podiatry;  Laterality: Right;   TENOTOMY / FLEXOR TENDON TRANSFER Right 10/01/2021   Procedure: FLEXOR TENDON REPAIR;  Surgeon: Rosetta Posner, DPM;  Location: [facility];  Service: Podiatry;  Laterality: Right;   TUBAL LIGATION     TUMOR EXCISION  2016   BENIGN-HEAD    Medical History: Past Medical History:  Diagnosis Date   Adopted    Arthritis    BOTH KNEES   Asthma    Benign tumor    Diabetes mellitus without complication (HCC)    Dyspnea    VERY RARE   Dysrhythmia    IRREGULAR PER PT   Headache    Hypertension    Myocardial infarction (HCC)    AGE 53   Sickle cell trait (HCC)     Family History: Family History  Adopted: Yes    Social History   Socioeconomic History   Marital status: Single    Spouse name: Not on file   Number of children: Not on file   Years of education: Not on file   Highest education level: Not on file  Occupational History   Not on file  Tobacco Use   Smoking status: Never   Smokeless tobacco: Never  Substance and Sexual Activity   Alcohol use: No   Drug use: No   Sexual activity: Not on file  Other Topics Concern   Not on file  Social History Narrative   Not on file   Social Drivers of Health   Financial Resource Strain: Not on file  Food Insecurity: Not on file  Transportation Needs: Not on file  Physical Activity: Not on file  Stress: Not on file  Social Connections: Not on file  Intimate Partner Violence: Not on file      Review of Systems  Constitutional:  Positive for activity change and fatigue. Negative for chills and fever.  HENT:  Positive for congestion, postnasal drip, rhinorrhea, sinus pressure, sinus pain and sore throat. Negative for ear pain.   Respiratory:  Positive for  cough, chest tightness, shortness of breath and wheezing.   Cardiovascular: Negative.  Negative for chest pain and palpitations.  Musculoskeletal:  Positive for myalgias (body aches).  Neurological:  Positive for headaches.    Vital Signs: Ht 5\' 8"  (1.727 m)   Wt 265 lb (120.2 kg)   LMP 11/23/2018 (Approximate)   BMI 40.29 kg/m    Observation/Objective: She is alert and oriented. Her voice is quiet and hoarse. She is coughing a lot.     Assessment/Plan: 1. Upper respiratory tract infection due to COVID-19 virus (Primary) Take paxlovid and prednisone as prescribed until gone. Take cough medication as needed  - nirmatrelvir/ritonavir (PAXLOVID) 20 x 150 MG & 10 x 100MG  TABS; Take 3 tablets by mouth 2 (two) times daily for 5 days.  Dispense: 30 tablet; Refill: 0 - chlorpheniramine-HYDROcodone (TUSSIONEX) 10-8 MG/5ML; Take 5 mLs by mouth every 12 (twelve) hours as needed for cough.  Dispense: 140 mL; Refill: 0 - predniSONE (  STERAPRED UNI-PAK 21 TAB) 10 MG (21) TBPK tablet; Use as directed for 6 days  Dispense: 21 tablet; Refill: 0  2. Acute cough Cough medication prescribed for symptoms relief.  - chlorpheniramine-HYDROcodone (TUSSIONEX) 10-8 MG/5ML; Take 5 mLs by mouth every 12 (twelve) hours as needed for cough.  Dispense: 140 mL; Refill: 0  3. Wheezing Take prednisone as prescribed until gone  - predniSONE (STERAPRED UNI-PAK 21 TAB) 10 MG (21) TBPK tablet; Use as directed for 6 days  Dispense: 21 tablet; Refill: 0   General Counseling: Josetta verbalizes understanding of the findings of today's phone visit and agrees with plan of treatment. I have discussed any further diagnostic evaluation that may be needed or ordered today. We also reviewed her medications today. she has been encouraged to call the office with any questions or concerns that should arise related to todays visit.  Return if symptoms worsen or fail to improve.   No orders of the defined types were placed in this  encounter.   Meds ordered this encounter  Medications   nirmatrelvir/ritonavir (PAXLOVID) 20 x 150 MG & 10 x 100MG  TABS    Sig: Take 3 tablets by mouth 2 (two) times daily for 5 days.    Dispense:  30 tablet    Refill:  0    GFR is normal   chlorpheniramine-HYDROcodone (TUSSIONEX) 10-8 MG/5ML    Sig: Take 5 mLs by mouth every 12 (twelve) hours as needed for cough.    Dispense:  140 mL    Refill:  0    Fill new script today   predniSONE (STERAPRED UNI-PAK 21 TAB) 10 MG (21) TBPK tablet    Sig: Use as directed for 6 days    Dispense:  21 tablet    Refill:  0    Fill new script today.    Time spent:10 Minutes Time spent with patient included reviewing progress notes, labs, imaging studies, and discussing plan for follow up.  [place] Controlled Substance Database was reviewed by me for overdose risk score (ORS) if appropriate.  This patient was seen by Sallyanne Kuster, FNP-C in collaboration with Dr. Beverely Risen as a part of collaborative care agreement.  Garvin Ellena R. Tedd Sias, MSN, FNP-C Internal medicine

## 2023-05-25 NOTE — Telephone Encounter (Signed)
 Reschedule to 07/13/2023 with Dr. Allegra Lai

## 2023-06-08 DIAGNOSIS — G629 Polyneuropathy, unspecified: Secondary | ICD-10-CM | POA: Diagnosis not present

## 2023-06-08 DIAGNOSIS — M2012 Hallux valgus (acquired), left foot: Secondary | ICD-10-CM | POA: Diagnosis not present

## 2023-06-08 DIAGNOSIS — M2042 Other hammer toe(s) (acquired), left foot: Secondary | ICD-10-CM | POA: Diagnosis not present

## 2023-06-08 DIAGNOSIS — M249 Joint derangement, unspecified: Secondary | ICD-10-CM | POA: Diagnosis not present

## 2023-06-08 DIAGNOSIS — M79672 Pain in left foot: Secondary | ICD-10-CM | POA: Diagnosis not present

## 2023-06-14 ENCOUNTER — Telehealth: Payer: Self-pay

## 2023-06-14 MED ORDER — AZITHROMYCIN 250 MG PO TABS: ORAL_TABLET | ORAL | 0 refills | Status: DC

## 2023-06-14 NOTE — Telephone Encounter (Signed)
 Pt called that post covid she still having sore throat and ringing on left ear hard to swallowing ,coughing ,no body ache,no fever as per alyssa advised her that we sending Zpak and take Mucinex and gargle with salt water and if not feeling better need appt in person

## 2023-06-23 ENCOUNTER — Encounter: Payer: Self-pay | Admitting: Physician Assistant

## 2023-06-23 ENCOUNTER — Ambulatory Visit (INDEPENDENT_AMBULATORY_CARE_PROVIDER_SITE_OTHER): Payer: 59 | Admitting: Physician Assistant

## 2023-06-23 VITALS — BP 150/90 | HR 83 | Temp 98.4°F | Resp 16 | Ht 68.5 in | Wt 277.0 lb

## 2023-06-23 DIAGNOSIS — H6690 Otitis media, unspecified, unspecified ear: Secondary | ICD-10-CM | POA: Diagnosis not present

## 2023-06-23 DIAGNOSIS — I10 Essential (primary) hypertension: Secondary | ICD-10-CM | POA: Diagnosis not present

## 2023-06-23 DIAGNOSIS — K439 Ventral hernia without obstruction or gangrene: Secondary | ICD-10-CM | POA: Diagnosis not present

## 2023-06-23 DIAGNOSIS — E119 Type 2 diabetes mellitus without complications: Secondary | ICD-10-CM | POA: Diagnosis not present

## 2023-06-23 DIAGNOSIS — H9192 Unspecified hearing loss, left ear: Secondary | ICD-10-CM | POA: Diagnosis not present

## 2023-06-23 DIAGNOSIS — U099 Post covid-19 condition, unspecified: Secondary | ICD-10-CM | POA: Diagnosis not present

## 2023-06-23 LAB — POCT GLYCOSYLATED HEMOGLOBIN (HGB A1C): Hemoglobin A1C: 6.2 % — AB (ref 4.0–5.6)

## 2023-06-23 MED ORDER — TRULICITY 4.5 MG/0.5ML ~~LOC~~ SOAJ: 4.5000 mg | SUBCUTANEOUS | 2 refills | Status: DC

## 2023-06-23 MED ORDER — AMOXICILLIN-POT CLAVULANATE 875-125 MG PO TABS: 1.0000 | ORAL_TABLET | Freq: Two times a day (BID) | ORAL | 0 refills | Status: DC

## 2023-06-23 NOTE — Progress Notes (Signed)
 [facility] [address] [place], [place] [address]  Internal MEDICINE  Office Visit Note  Patient Name: Destiny Singh  [id number]  [id number]  Date of Service: 06/23/2023  Chief Complaint  Patient presents with   Follow-up   Diabetes   Hypertension   Quality Metric Gaps    Pneumonia and Shingles vaccines, had to reschedule Colonoscopy    HPI Pt is here for routine follow up -Had covid 3/19 and since then has had loss of hearing on left ear--only hears what she describes as white noise. Does feel sore throat on left as well. A little painful. Already had zapk as well post covid treatment, but no improvement. Will send augmentin and ENT referral  -breathing is doing ok -BP at home not checked regularly. Had a high reading one [place] when she missed her meds. At work one [place] was 135/65. Has not taken her meds yet this morning and is elevated in office on recheck. She will take her med now -sometimes has protrusion in LUQ, thinks she was fold before she may have a hernia, but has not had imaging for it. She states it sticks out when bending down, but will go back on its own. Will order CT to evaluate  -rescheduled colonoscopy due to covid -would like to increase trulicity  Current Medication: Outpatient Encounter Medications as of 06/23/2023  Medication Sig   amoxicillin-clavulanate (AUGMENTIN) 875-125 MG tablet Take 1 tablet by mouth 2 (two) times daily. Take with food.   ASHWAGANDHA 35 PO Take by mouth.   chlorpheniramine-HYDROcodone (TUSSIONEX) 10-8 MG/5ML Take 5 mLs by mouth every 12 (twelve) hours as needed for cough.   cholecalciferol (VITAMIN D3) 25 MCG (1000 UNIT) tablet Take 1,000 Units by mouth daily.   cyanocobalamin (,VITAMIN B-12,) 1000 MCG/ML injection Inject 1,000 mcg into the muscle See admin instructions. Every 2 months   Dulaglutide (TRULICITY) 4.5 MG/0.5ML SOAJ Inject 4.5 mg as directed once a week.   ERGOCALCIFEROL PO Take by mouth.   ibuprofen (ADVIL) 800  MG tablet Take 1 tablet (800 mg total) by mouth every 8 (eight) hours as needed for mild pain.   metoprolol tartrate (LOPRESSOR) 50 MG tablet TAKE 1 TABLET BY MOUTH TWICE A [place]   nortriptyline (PAMELOR) 50 MG capsule Take 50 mg by mouth at bedtime.   predniSONE (STERAPRED UNI-PAK 21 TAB) 10 MG (21) TBPK tablet Use as directed for 6 days   [DISCONTINUED] azithromycin (ZITHROMAX) 250 MG tablet Use as directed for 5 days   [DISCONTINUED] Dulaglutide (TRULICITY) 3 MG/0.5ML SOAJ Inject 3 mg as directed once a week.   No facility-administered encounter medications on file as of 06/23/2023.    Surgical History: Past Surgical History:  Procedure Laterality Date   BREAST EXCISIONAL BIOPSY     in teens per pt   BUNIONECTOMY Right 10/01/2021   Procedure: BUNIONECTOMY - LAPIDUS - TYPE;  Surgeon: Pink Bridges, DPM;  Location: [facility];  Service: Podiatry;  Laterality: Right;   CALCANEAL OSTEOTOMY Right 10/01/2021   Procedure: EVANS/MCDO;  Surgeon: Pink Bridges, DPM;  Location: [facility];  Service: Podiatry;  Laterality: Right;   CHONDROPLASTY Right 04/01/2016   Procedure: CHONDROPLASTY -ABRASION OF FEMORAL TROCHLEA, LATERAL TIBIA Plateau;  Surgeon: Elner Hahn, MD;  Location: [facility];  Service: Orthopedics;  Laterality: Right;   GASTROC RECESSION EXTREMITY Right 10/01/2021   Procedure: GASTROC RECESSION;  Surgeon: Pink Bridges, DPM;  Location: [facility];  Service: Podiatry;  Laterality: Right;   KNEE ARTHROSCOPY  Left 06/18/2020   Procedure: LEFT KNEE ARTHROSCOPY WITH DEBRIDEMENT AND PARTIAL MEDIAL MENISCECTOMY;  Surgeon: Elner Hahn, MD;  Location: [facility];  Service: Orthopedics;  Laterality: Left;   KNEE ARTHROSCOPY WITH MENISCAL REPAIR Right 04/01/2016   Procedure: KNEE ARTHROSCOPY WITH PARTIAL MEDIAL  MENISCAL REPAIR;  Surgeon: Elner Hahn, MD;  Location: [facility];  Service: Orthopedics;  Laterality: Right;   TENDON TRANSFER Right 10/01/2021   Procedure: FDL  TRANSFER; DEEP;  Surgeon: Pink Bridges, DPM;  Location: [facility];  Service: Podiatry;  Laterality: Right;   TENOTOMY / FLEXOR TENDON TRANSFER Right 10/01/2021   Procedure: FLEXOR TENDON REPAIR;  Surgeon: Pink Bridges, DPM;  Location: [facility];  Service: Podiatry;  Laterality: Right;   TUBAL LIGATION     TUMOR EXCISION  2016   BENIGN-HEAD    Medical History: Past Medical History:  Diagnosis Date   Adopted    Arthritis    BOTH KNEES   Asthma    Benign tumor    Diabetes mellitus without complication (HCC)    Dyspnea    VERY RARE   Dysrhythmia    IRREGULAR PER PT   Headache    Hypertension    Myocardial infarction (HCC)    AGE 2   Sickle cell trait (HCC)     Family History: Family History  Adopted: Yes    Social History   Socioeconomic History   Marital status: Single    Spouse name: Not on file   Number of children: Not on file   Years of education: Not on file   Highest education level: Not on file  Occupational History   Not on file  Tobacco Use   Smoking status: Never   Smokeless tobacco: Never  Substance and Sexual Activity   Alcohol use: No   Drug use: No   Sexual activity: Not on file  Other Topics Concern   Not on file  Social History Narrative   Not on file   Social Drivers of Health   Financial Resource Strain: Low Risk  (06/08/2023)   Received from [facility]   Overall Financial Resource Strain (CARDIA)    Difficulty of Paying Living Expenses: Not hard at all  Food Insecurity: No Food Insecurity (06/08/2023)   Received from [facility]   Hunger Vital Sign    Worried About Running Out of Food in the Last Year: Never true    Ran Out of Food in the Last Year: Never true  Transportation Needs: No Transportation Needs (06/08/2023)   Received from [facility] - Transportation    In the past 12 months, has lack of transportation kept you from medical appointments or  from getting medications?: No    Lack of Transportation (Non-Medical): No  Physical Activity: Not on file  Stress: Not on file  Social Connections: Not on file  Intimate Partner Violence: Not on file      Review of Systems  Constitutional:  Positive for fatigue. Negative for chills and unexpected weight change.  HENT:  Positive for ear pain, hearing loss and sore throat. Negative for congestion, postnasal drip, rhinorrhea and sneezing.   Eyes:  Negative for redness.  Respiratory:  Negative for cough, chest tightness and shortness of breath.   Cardiovascular:  Negative for chest pain and palpitations.  Gastrointestinal:  Negative for abdominal pain, constipation, diarrhea, nausea and vomiting.  Genitourinary:  Negative for dysuria and frequency.  Musculoskeletal:  Positive for  arthralgias and gait problem. Negative for back pain, joint swelling and neck pain.  Skin:  Negative for rash.  Neurological:  Negative for tremors and numbness.  Hematological:  Negative for adenopathy. Does not bruise/bleed easily.  Psychiatric/Behavioral:  Positive for sleep disturbance. Negative for behavioral problems (Depression) and suicidal ideas. The patient is not nervous/anxious.     Vital Signs: BP (!) 150/90   Pulse 83   Temp 98.4 F (36.9 C)   Resp 16   Ht 5' 8.5" (1.74 m)   Wt 277 lb (125.6 kg)   LMP 11/23/2018 (Approximate)   SpO2 96%   BMI 41.51 kg/m    Physical Exam Vitals and nursing note reviewed.  Constitutional:      Appearance: Normal appearance.  HENT:     Head: Normocephalic and atraumatic.     Right Ear: Tympanic membrane normal.     Ears:     Comments: Erythema left TM    Nose: Nose normal.     Mouth/Throat:     Mouth: Mucous membranes are moist.     Pharynx: No posterior oropharyngeal erythema.  Eyes:     Extraocular Movements: Extraocular movements intact.     Pupils: Pupils are equal, round, and reactive to light.  Cardiovascular:     Rate and Rhythm: Normal  rate and regular rhythm.     Pulses: Normal pulses.     Heart sounds: Normal heart sounds.  Pulmonary:     Effort: Pulmonary effort is normal.     Breath sounds: Normal breath sounds.  Skin:    General: Skin is warm and dry.  Neurological:     General: No focal deficit present.     Mental Status: She is alert.  Psychiatric:        Mood and Affect: Mood normal.        Behavior: Behavior normal.        Assessment/Plan: 1. Essential hypertension (Primary) Elevated in office, but has not had her medication yet today since getting off of work and will take this now. Continue to monitor  2. Type 2 diabetes mellitus without complication, without long-term current use of insulin (HCC) - POCT HgB A1C is 6.2 which is improved from 6.3. Will increase trulicity to help BG as well as possible wt loss - Urine Microalbumin w/creat. ratio - Dulaglutide (TRULICITY) 4.5 MG/0.5ML SOAJ; Inject 4.5 mg as directed once a week.  Dispense: 2 mL; Refill: 2  3. Hearing loss of left ear, unspecified hearing loss type Will start augmentin for ear infection as possible contributor to hearing loss, however since hearing loss present for 1 month since covid and has completed zpak already without improvement, will go ahead and send ENT referral. - amoxicillin-clavulanate (AUGMENTIN) 875-125 MG tablet; Take 1 tablet by mouth 2 (two) times daily. Take with food.  Dispense: 14 tablet; Refill: 0 - Ambulatory referral to ENT  4. 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 Take with food.  Dispense: 14 tablet; Refill: 0 - Ambulatory referral to ENT  5. Acute otitis media, unspecified otitis media type - amoxicillin-clavulanate (AUGMENTIN) 875-125 MG tablet; Take 1 tablet by mouth 2 (two) times daily. Take with food.  Dispense: 14 tablet; Refill: 0 - Ambulatory referral to ENT  6. Hernia of abdominal wall - CT ABDOMEN  PELVIS WO CONTRAST; Future   General Counseling: Nashira verbalizes understanding of the findings of todays visit and agrees with plan  of treatment. I have discussed any further diagnostic evaluation that may be needed or ordered today. We also reviewed her medications today. she has been encouraged to call the office with any questions or concerns that should arise related to todays visit.    Orders Placed This Encounter  Procedures   CT ABDOMEN PELVIS WO CONTRAST   Urine Microalbumin w/creat. ratio   Ambulatory referral to ENT   POCT HgB A1C    Meds ordered this encounter  Medications   Dulaglutide (TRULICITY) 4.5 MG/0.5ML SOAJ    Sig: Inject 4.5 mg as directed once a week.    Dispense:  2 mL    Refill:  2   amoxicillin-clavulanate (AUGMENTIN) 875-125 MG tablet    Sig: Take 1 tablet by mouth 2 (two) times daily. Take with food.    Dispense:  14 tablet    Refill:  0    This patient was seen by Taylor Favia, PA-C in collaboration with Dr. Verneta Gone as a part of collaborative care agreement.   Total time spent:30 Minutes Time spent includes review of chart, medications, test results, and follow up plan with the patient.      Dr Fozia M Khan Internal medicine

## 2023-06-27 ENCOUNTER — Telehealth: Payer: Self-pay | Admitting: Physician Assistant

## 2023-06-27 NOTE — Telephone Encounter (Signed)
 Otolaryngology referral sent via [facility] to [facility]. Notified patient. Gave pt telephone # [phone]

## 2023-06-29 ENCOUNTER — Telehealth: Payer: Self-pay | Admitting: Physician Assistant

## 2023-06-29 NOTE — Telephone Encounter (Signed)
 Otolaryngology appointment 07/12/2023 @ [facility]-Toni

## 2023-07-05 ENCOUNTER — Ambulatory Visit
Admission: RE | Admit: 2023-07-05 | Discharge: 2023-07-05 | Disposition: A | Discharge status: Home / Self Care | Source: Ambulatory Visit | Attending: Physician Assistant | Admitting: Physician Assistant

## 2023-07-05 DIAGNOSIS — K439 Ventral hernia without obstruction or gangrene: Secondary | ICD-10-CM

## 2023-07-05 DIAGNOSIS — K421 Umbilical hernia with gangrene: Secondary | ICD-10-CM | POA: Diagnosis not present

## 2023-07-06 ENCOUNTER — Telehealth: Payer: Self-pay

## 2023-07-06 NOTE — Telephone Encounter (Signed)
 Pt has requested to reschedule her colonoscopy from 07/13/23 with Dr. Baldomero Bone due to pneumonia.  Colonoscopy has been rescheduled with Dr. Ole Berkeley since Dr. Baldomero Bone will not be here in June to reschedule with.  She is a former patient of Tina's. New date is 08/18/23. Pt saw Brian Campanile on 04/20/23 for rectal bleed.  Thanks, [place], [place]

## 2023-07-10 ENCOUNTER — Other Ambulatory Visit: Payer: Self-pay | Admitting: Physician Assistant

## 2023-07-10 DIAGNOSIS — I10 Essential (primary) hypertension: Secondary | ICD-10-CM

## 2023-07-12 ENCOUNTER — Telehealth: Payer: Self-pay | Admitting: Physician Assistant

## 2023-07-12 DIAGNOSIS — H9122 Sudden idiopathic hearing loss, left ear: Secondary | ICD-10-CM | POA: Diagnosis not present

## 2023-07-12 DIAGNOSIS — H9042 Sensorineural hearing loss, unilateral, left ear, with unrestricted hearing on the contralateral side: Secondary | ICD-10-CM | POA: Diagnosis not present

## 2023-07-12 DIAGNOSIS — R42 Dizziness and giddiness: Secondary | ICD-10-CM | POA: Diagnosis not present

## 2023-07-12 NOTE — Telephone Encounter (Signed)
 Lvm to schedule follow up for Ct results-Toni

## 2023-07-13 ENCOUNTER — Telehealth: Payer: Self-pay | Admitting: Physician Assistant

## 2023-07-13 NOTE — Telephone Encounter (Signed)
 Left another vm to schedule follow up to review CT results-Toni

## 2023-07-22 ENCOUNTER — Ambulatory Visit (INDEPENDENT_AMBULATORY_CARE_PROVIDER_SITE_OTHER): Admitting: Physician Assistant

## 2023-07-22 ENCOUNTER — Other Ambulatory Visit: Payer: Self-pay | Admitting: Physician Assistant

## 2023-07-22 ENCOUNTER — Encounter: Payer: Self-pay | Admitting: Physician Assistant

## 2023-07-22 VITALS — BP 130/80 | HR 80 | Temp 97.8°F | Resp 16 | Ht 68.5 in | Wt 278.0 lb

## 2023-07-22 DIAGNOSIS — I10 Essential (primary) hypertension: Secondary | ICD-10-CM | POA: Diagnosis not present

## 2023-07-22 DIAGNOSIS — E119 Type 2 diabetes mellitus without complications: Secondary | ICD-10-CM

## 2023-07-22 DIAGNOSIS — K429 Umbilical hernia without obstruction or gangrene: Secondary | ICD-10-CM | POA: Diagnosis not present

## 2023-07-22 NOTE — Progress Notes (Signed)
 [facility] [address] [place], [place] [address]  Internal MEDICINE  Office Visit Note  Patient Name: Destiny Singh  [id number]  [id number]  Date of Service: 08/10/2023  Chief Complaint  Patient presents with   Follow-up    Review CT    HPI Pt is here for routine follow up -Colonoscopy in June -CT abdomen/pelvis does confirm 2 small fat containing hernias, umbilical and periumbilical which correlates to area she feels bulge. This does hurt on a daily basis. Is interested in [facility] referral. -trulicity  4.5 mg doing well, about to get refill -BP stable  Current Medication: Outpatient Encounter Medications as of 07/22/2023  Medication Sig   ASHWAGANDHA 35 PO Take by mouth.   cholecalciferol (VITAMIN D3) 25 MCG (1000 UNIT) tablet Take 1,000 Units by mouth daily.   cyanocobalamin  (,VITAMIN B-12,) 1000 MCG/ML injection Inject 1,000 mcg into the muscle See admin instructions. Every 2 months   Dulaglutide  (TRULICITY ) 4.5 MG/0.5ML SOAJ Inject 4.5 mg as directed once a week.   ERGOCALCIFEROL  PO Take by mouth.   ibuprofen  (ADVIL ) 800 MG tablet Take 1 tablet (800 mg total) by mouth every 8 (eight) hours as needed for mild pain.   metoprolol  tartrate (LOPRESSOR ) 50 MG tablet TAKE 1 TABLET BY MOUTH TWICE A [place]   nortriptyline (PAMELOR) 50 MG capsule Take 50 mg by mouth at bedtime.   [DISCONTINUED] amoxicillin -clavulanate (AUGMENTIN ) 875-125 MG tablet Take 1 tablet by mouth 2 (two) times daily. Take with food.   [DISCONTINUED] chlorpheniramine-HYDROcodone  (TUSSIONEX) 10-8 MG/5ML Take 5 mLs by mouth every 12 (twelve) hours as needed for cough.   [DISCONTINUED] predniSONE  (STERAPRED UNI-PAK 21 TAB) 10 MG (21) TBPK tablet Use as directed for 6 days   No facility-administered encounter medications on file as of 07/22/2023.    Surgical History: Past Surgical History:  Procedure Laterality Date   BREAST EXCISIONAL BIOPSY     in teens per pt   BUNIONECTOMY Right 10/01/2021   Procedure:  BUNIONECTOMY - LAPIDUS - TYPE;  Surgeon: Pink Bridges, DPM;  Location: [facility];  Service: Podiatry;  Laterality: Right;   CALCANEAL OSTEOTOMY Right 10/01/2021   Procedure: EVANS/MCDO;  Surgeon: Pink Bridges, DPM;  Location: [facility];  Service: Podiatry;  Laterality: Right;   CHONDROPLASTY Right 04/01/2016   Procedure: CHONDROPLASTY -ABRASION OF FEMORAL TROCHLEA, LATERAL TIBIA [place]au;  Surgeon: Elner Hahn, MD;  Location: [facility];  Service: Orthopedics;  Laterality: Right;   GASTROC RECESSION EXTREMITY Right 10/01/2021   Procedure: GASTROC RECESSION;  Surgeon: Pink Bridges, DPM;  Location: [facility];  Service: Podiatry;  Laterality: Right;   KNEE ARTHROSCOPY Left 06/18/2020   Procedure: LEFT KNEE ARTHROSCOPY WITH DEBRIDEMENT AND PARTIAL MEDIAL MENISCECTOMY;  Surgeon: Elner Hahn, MD;  Location: [facility];  Service: Orthopedics;  Laterality: Left;   KNEE ARTHROSCOPY WITH MENISCAL REPAIR Right 04/01/2016   Procedure: KNEE ARTHROSCOPY WITH PARTIAL MEDIAL  MENISCAL REPAIR;  Surgeon: Elner Hahn, MD;  Location: [facility];  Service: Orthopedics;  Laterality: Right;   TENDON TRANSFER Right 10/01/2021   Procedure: FDL TRANSFER; DEEP;  Surgeon: Pink Bridges, DPM;  Location: [facility];  Service: Podiatry;  Laterality: Right;   TENOTOMY / FLEXOR TENDON TRANSFER Right 10/01/2021   Procedure: FLEXOR TENDON REPAIR;  Surgeon: Pink Bridges, DPM;  Location: [facility];  Service: Podiatry;  Laterality: Right;   TUBAL LIGATION     TUMOR EXCISION  2016   BENIGN-HEAD    Medical History: Past Medical History:  Diagnosis  Date   Adopted    Arthritis    BOTH KNEES   Asthma    Benign tumor    Diabetes mellitus without complication (HCC)    Dyspnea    VERY RARE   Dysrhythmia    IRREGULAR PER PT   Headache    Hypertension    Myocardial infarction (HCC)    AGE 53   Sickle cell trait (HCC)     Family History: Family History  Adopted: Yes     Social History   Socioeconomic History   Marital status: Single    Spouse name: Not on file   Number of children: Not on file   Years of education: Not on file   Highest education level: Not on file  Occupational History   Not on file  Tobacco Use   Smoking status: Never   Smokeless tobacco: Never  Substance and Sexual Activity   Alcohol use: No   Drug use: No   Sexual activity: Not on file  Other Topics Concern   Not on file  Social History Narrative   Not on file   Social Drivers of Health   Financial Resource Strain: Low Risk  (06/08/2023)   Received from [facility]   Overall Financial Resource Strain (CARDIA)    Difficulty of Paying Living Expenses: Not hard at all  Food Insecurity: No Food Insecurity (06/08/2023)   Received from [facility]   Hunger Vital Sign    Worried About Running Out of Food in the Last Year: Never true    Ran Out of Food in the Last Year: Never true  Transportation Needs: No Transportation Needs (06/08/2023)   Received from [facility]    In the past 12 months, has lack of transportation kept you from medical appointments or from getting medications?: No    Lack of Transportation (Non-Medical): No  Physical Activity: Not on file  Stress: Not on file  Social Connections: Not on file  Intimate Partner Violence: Not on file      Review of Systems  Constitutional:  Negative for chills, fatigue and unexpected weight change.  HENT:  Positive for postnasal drip. Negative for congestion, rhinorrhea, sneezing and sore throat.   Eyes:  Negative for redness.  Respiratory:  Negative for cough, chest tightness and shortness of breath.   Cardiovascular:  Negative for chest pain and palpitations.  Gastrointestinal:  Positive for abdominal pain. Negative for constipation, diarrhea, nausea and vomiting.  Genitourinary:  Negative for dysuria and frequency.   Musculoskeletal:  Negative for arthralgias, back pain, joint swelling and neck pain.  Skin:  Negative for rash.  Neurological: Negative.  Negative for tremors and numbness.  Hematological:  Negative for adenopathy. Does not bruise/bleed easily.  Psychiatric/Behavioral:  Negative for behavioral problems (Depression), sleep disturbance and suicidal ideas. The patient is not nervous/anxious.     Vital Signs: BP 130/80   Pulse 80   Temp 97.8 F (36.6 C)   Resp 16   Ht 5' 8.5" (1.74 m)   Wt 278 lb (126.1 kg)   LMP 11/23/2018 (Approximate)   SpO2 94%   BMI 41.65 kg/m    Physical Exam Vitals and nursing note reviewed.  Constitutional:      Appearance: Normal appearance.  HENT:     Head: Normocephalic and atraumatic.  Eyes:     Extraocular Movements: Extraocular movements intact.  Cardiovascular:     Rate and Rhythm: Normal rate and  regular rhythm.  Pulmonary:     Effort: Pulmonary effort is normal.     Breath sounds: Normal breath sounds.  Skin:    General: Skin is warm and dry.  Neurological:     Mental Status: She is alert and oriented to person, place, and time.  Psychiatric:        Mood and Affect: Mood normal.        Behavior: Behavior normal.        Assessment/Plan: 1. Essential hypertension (Primary) Well controlled, continue current medications  2. Type 2 diabetes mellitus without complication, without long-term current use of insulin (HCC) Continue current medication  3. Periumbilical hernia Pt symptomatic and will refer to [facility] for further evaluation - Ambulatory referral to General Surgery  4. Umbilical hernia without obstruction and without gangrene Pt symptomatic and will refer to [facility] for further evaluation - Ambulatory referral to General Surgery   General Counseling: Emilina verbalizes understanding of the findings of todays visit and agrees with plan of treatment. I have discussed any further diagnostic evaluation that may be needed or ordered  today. We also reviewed her medications today. she has been encouraged to call the office with any questions or concerns that should arise related to todays visit.    Orders Placed This Encounter  Procedures   Ambulatory referral to General Surgery    No orders of the defined types were placed in this encounter.   This patient was seen by Taylor Favia, PA-C in collaboration with Dr. Verneta Gone as a part of collaborative care agreement.   Total time spent:30 Minutes Time spent includes review of chart, medications, test results, and follow up plan with the patient.      Dr Fozia M Khan Internal medicine

## 2023-07-26 DIAGNOSIS — H6983 Other specified disorders of Eustachian tube, bilateral: Secondary | ICD-10-CM | POA: Diagnosis not present

## 2023-07-26 DIAGNOSIS — H9012 Conductive hearing loss, unilateral, left ear, with unrestricted hearing on the contralateral side: Secondary | ICD-10-CM | POA: Diagnosis not present

## 2023-07-26 DIAGNOSIS — H9122 Sudden idiopathic hearing loss, left ear: Secondary | ICD-10-CM | POA: Diagnosis not present

## 2023-08-18 ENCOUNTER — Ambulatory Visit: Admitting: Anesthesiology

## 2023-08-18 ENCOUNTER — Ambulatory Visit
Admission: RE | Admit: 2023-08-18 | Discharge: 2023-08-18 | Disposition: A | Discharge status: Home / Self Care | Payer: 59 | Attending: Gastroenterology | Admitting: Gastroenterology

## 2023-08-18 ENCOUNTER — Encounter: Payer: Self-pay | Admitting: Gastroenterology

## 2023-08-18 ENCOUNTER — Encounter
Admission: RE | Disposition: A | Discharge status: Home / Self Care | Payer: Self-pay | Source: Home / Self Care | Attending: Gastroenterology

## 2023-08-18 DIAGNOSIS — I252 Old myocardial infarction: Secondary | ICD-10-CM | POA: Diagnosis not present

## 2023-08-18 DIAGNOSIS — G4730 Sleep apnea, unspecified: Secondary | ICD-10-CM | POA: Insufficient documentation

## 2023-08-18 DIAGNOSIS — E66813 Obesity, class 3: Secondary | ICD-10-CM | POA: Diagnosis not present

## 2023-08-18 DIAGNOSIS — K635 Polyp of colon: Secondary | ICD-10-CM

## 2023-08-18 DIAGNOSIS — D124 Benign neoplasm of descending colon: Secondary | ICD-10-CM | POA: Insufficient documentation

## 2023-08-18 DIAGNOSIS — J45909 Unspecified asthma, uncomplicated: Secondary | ICD-10-CM | POA: Diagnosis not present

## 2023-08-18 DIAGNOSIS — K921 Melena: Secondary | ICD-10-CM | POA: Insufficient documentation

## 2023-08-18 DIAGNOSIS — I10 Essential (primary) hypertension: Secondary | ICD-10-CM | POA: Diagnosis not present

## 2023-08-18 DIAGNOSIS — E119 Type 2 diabetes mellitus without complications: Secondary | ICD-10-CM | POA: Diagnosis not present

## 2023-08-18 DIAGNOSIS — Z794 Long term (current) use of insulin: Secondary | ICD-10-CM | POA: Diagnosis not present

## 2023-08-18 DIAGNOSIS — K641 Second degree hemorrhoids: Secondary | ICD-10-CM | POA: Diagnosis not present

## 2023-08-18 DIAGNOSIS — Z6841 Body Mass Index (BMI) 40.0 and over, adult: Secondary | ICD-10-CM | POA: Diagnosis not present

## 2023-08-18 DIAGNOSIS — Z79899 Other long term (current) drug therapy: Secondary | ICD-10-CM | POA: Diagnosis not present

## 2023-08-18 DIAGNOSIS — K625 Hemorrhage of anus and rectum: Secondary | ICD-10-CM

## 2023-08-18 HISTORY — DX: Sleep apnea, unspecified: G47.30

## 2023-08-18 HISTORY — PX: POLYPECTOMY: SHX149

## 2023-08-18 HISTORY — PX: COLONOSCOPY WITH PROPOFOL: SHX5780

## 2023-08-18 LAB — GLUCOSE, CAPILLARY: Glucose-Capillary: 108 mg/dL — ABNORMAL HIGH (ref 70–99)

## 2023-08-18 SURGERY — COLONOSCOPY WITH PROPOFOL
Anesthesia: General

## 2023-08-18 MED ORDER — PROPOFOL 10 MG/ML IV BOLUS
INTRAVENOUS | Status: DC | PRN
  Administered 2023-08-18 (×2): 30 mg via INTRAVENOUS
  Administered 2023-08-18: 40 mg via INTRAVENOUS

## 2023-08-18 MED ORDER — PROPOFOL 500 MG/50ML IV EMUL
INTRAVENOUS | Status: DC | PRN
  Administered 2023-08-18: 50 ug/kg/min via INTRAVENOUS

## 2023-08-18 MED ORDER — LIDOCAINE HCL (CARDIAC) PF 100 MG/5ML IV SOSY
PREFILLED_SYRINGE | INTRAVENOUS | Status: DC | PRN
  Administered 2023-08-18: 100 mg via INTRAVENOUS

## 2023-08-18 MED ORDER — SODIUM CHLORIDE 0.9 % IV SOLN: INTRAVENOUS | Status: DC

## 2023-08-18 MED ORDER — DEXMEDETOMIDINE HCL IN NACL 80 MCG/20ML IV SOLN
INTRAVENOUS | Status: DC | PRN
  Administered 2023-08-18: 20 ug via INTRAVENOUS

## 2023-08-18 NOTE — H&P (Signed)
 [name], MD [facility] [address], [address] [place], [place] [address] Phone:[phone] Fax : [phone]  Primary Care Physician:  Jacques Mattock, PA-C Primary Gastroenterologist:  Dr. Ole Berkeley  Pre-Procedure History & Physical: HPI:  Destiny Singh is a 53 y.o. female is here for an colonoscopy.   Past Medical History:  Diagnosis Date   Adopted    Arthritis    BOTH KNEES   Asthma    Benign tumor    Diabetes mellitus without complication (HCC)    Dyspnea    VERY RARE   Dysrhythmia    IRREGULAR PER PT   Headache    Hypertension    Myocardial infarction (HCC)    AGE 62   Sickle cell trait (HCC)    Sleep apnea     Past Surgical History:  Procedure Laterality Date   BREAST EXCISIONAL BIOPSY     in teens per pt   BUNIONECTOMY Right 10/01/2021   Procedure: BUNIONECTOMY - LAPIDUS - TYPE;  Surgeon: Pink Bridges, DPM;  Location: [facility];  Service: Podiatry;  Laterality: Right;   CALCANEAL OSTEOTOMY Right 10/01/2021   Procedure: EVANS/MCDO;  Surgeon: Pink Bridges, DPM;  Location: [facility];  Service: Podiatry;  Laterality: Right;   CHONDROPLASTY Right 04/01/2016   Procedure: CHONDROPLASTY -ABRASION OF FEMORAL TROCHLEA, LATERAL TIBIA Plateau;  Surgeon: Elner Hahn, MD;  Location: [facility];  Service: Orthopedics;  Laterality: Right;   GASTROC RECESSION EXTREMITY Right 10/01/2021   Procedure: GASTROC RECESSION;  Surgeon: Pink Bridges, DPM;  Location: [facility];  Service: Podiatry;  Laterality: Right;   KNEE ARTHROSCOPY Left 06/18/2020   Procedure: LEFT KNEE ARTHROSCOPY WITH DEBRIDEMENT AND PARTIAL MEDIAL MENISCECTOMY;  Surgeon: Elner Hahn, MD;  Location: [facility];  Service: Orthopedics;  Laterality: Left;   KNEE ARTHROSCOPY WITH MENISCAL REPAIR Right 04/01/2016   Procedure: KNEE ARTHROSCOPY WITH PARTIAL MEDIAL  MENISCAL REPAIR;  Surgeon: Elner Hahn, MD;  Location: [facility];  Service: Orthopedics;  Laterality: Right;   TENDON TRANSFER Right  10/01/2021   Procedure: FDL TRANSFER; DEEP;  Surgeon: Pink Bridges, DPM;  Location: [facility];  Service: Podiatry;  Laterality: Right;   TENOTOMY / FLEXOR TENDON TRANSFER Right 10/01/2021   Procedure: FLEXOR TENDON REPAIR;  Surgeon: Pink Bridges, DPM;  Location: [facility];  Service: Podiatry;  Laterality: Right;   TUBAL LIGATION     TUMOR EXCISION  2016   BENIGN-HEAD    Prior to Admission medications   Medication Sig Start Date End Date Taking? Authorizing Provider  ASHWAGANDHA 35 PO Take by mouth.    [provider]  cholecalciferol (VITAMIN D3) 25 MCG (1000 UNIT) tablet Take 1,000 Units by mouth daily.    [provider]  cyanocobalamin  (,VITAMIN B-12,) 1000 MCG/ML injection Inject 1,000 mcg into the muscle See admin instructions. Every 2 months    [provider]  Dulaglutide  (TRULICITY ) 4.5 MG/0.5ML SOAJ Inject 4.5 mg as directed once a week. 06/23/23   McDonough, Lauren K, PA-C  ERGOCALCIFEROL  PO Take by mouth. 04/27/22   [provider]  ibuprofen  (ADVIL ) 800 MG tablet Take 1 tablet (800 mg total) by mouth every 8 (eight) hours as needed for mild pain. 10/17/20   Khan, Fozia M, MD  metoprolol  tartrate (LOPRESSOR ) 50 MG tablet TAKE 1 TABLET BY MOUTH TWICE A Ullom 03/21/23   McDonough, Lauren K, PA-C  nortriptyline (PAMELOR) 50 MG capsule Take 50 mg by mouth at bedtime.    [provider]  Allergies as of 04/20/2023 - Review Complete 04/20/2023  Allergen Reaction Noted   Bee venom Anaphylaxis and Swelling 06/17/2015   Other Swelling and Anaphylaxis 07/28/2015   Tramadol Other (See Comments) 08/07/2021    Family History  Adopted: Yes    Social History   Socioeconomic History   Marital status: Single    Spouse name: Not on file   Number of children: Not on file   Years of education: Not on file   Highest education level: Not on file  Occupational History   Not on file  Tobacco Use   Smoking status: Never    Smokeless tobacco: Never  Vaping Use   Vaping status: Never Used  Substance and Sexual Activity   Alcohol use: No   Drug use: No   Sexual activity: Not on file  Other Topics Concern   Not on file  Social History Narrative   Not on file   Social Drivers of Health   Financial Resource Strain: Low Risk  (06/08/2023)   Received from [facility]   Overall Financial Resource Strain (CARDIA)    Difficulty of Paying Living Expenses: Not hard at all  Food Insecurity: No Food Insecurity (06/08/2023)   Received from [facility]   Hunger Vital Sign    Worried About Running Out of Food in the Last Year: Never true    Ran Out of Food in the Last Year: Never true  Transportation Needs: No Transportation Needs (06/08/2023)   Received from [facility] - Transportation    In the past 12 months, has lack of transportation kept you from medical appointments or from getting medications?: No    Lack of Transportation (Non-Medical): No  Physical Activity: Not on file  Stress: Not on file  Social Connections: Not on file  Intimate Partner Violence: Not on file    Review of Systems: See HPI, otherwise negative ROS  Physical Exam: Ht 5' 8.5 (1.74 m)   Wt 127 kg   LMP 11/23/2018 (Approximate)   BMI 41.95 kg/m  General:   Alert,  pleasant and cooperative in NAD Head:  Normocephalic and atraumatic. Neck:  Supple; no masses or thyromegaly. Lungs:  Clear throughout to auscultation.    Heart:  Regular rate and rhythm. Abdomen:  Soft, nontender and nondistended. Normal bowel sounds, without guarding, and without rebound.   Neurologic:  Alert and  oriented x4;  grossly normal neurologically.  Impression/Plan: Destiny Singh is here for an colonoscopy to be performed for rectal bleeding  Risks, benefits, limitations, and alternatives regarding  colonoscopy have been reviewed with the patient.  Questions have been answered.  All parties  agreeable.   [name], MD  08/18/2023, 9:37 AM

## 2023-08-18 NOTE — Anesthesia Preprocedure Evaluation (Addendum)
 Anesthesia Evaluation  Patient identified by MRN, date of birth, ID band Patient awake    Reviewed: Allergy & Precautions, H&P , NPO status , Patient's Chart, lab work & pertinent test results, reviewed documented beta blocker date and time   Airway Mallampati: III  TM Distance: >3 FB Neck ROM: full    Dental  (+) Teeth Intact   Pulmonary shortness of breath, asthma , sleep apnea and Continuous Positive Airway Pressure Ventilation    Pulmonary exam normal        Cardiovascular Exercise Tolerance: Good hypertension, On Medications + Past MI  Normal cardiovascular exam+ dysrhythmias  Rhythm:regular Rate:Normal     Neuro/Psych  Headaches  negative psych ROS   GI/Hepatic negative GI ROS, Neg liver ROS,,,  Endo/Other  diabetes, Well Controlled, Type 2, Oral Hypoglycemic Agents  Class 3 obesity  Renal/GU      Musculoskeletal   Abdominal   Peds  Hematology negative hematology ROS (+)   Anesthesia Other Findings Past Medical History: No date: Adopted No date: Arthritis     Comment:  BOTH KNEES No date: Asthma No date: Benign tumor No date: Diabetes mellitus without complication (HCC) No date: Dyspnea     Comment:  VERY RARE No date: Dysrhythmia     Comment:  IRREGULAR PER PT No date: Headache No date: Hypertension No date: Myocardial infarction (HCC)     Comment:  AGE 53 Past Surgical History: No date: BREAST SURGERY; Bilateral     Comment:  BENIGN TUMORS 04/01/2016: CHONDROPLASTY; Right     Comment:  Procedure: CHONDROPLASTY -ABRASION OF FEMORAL TROCHLEA,               LATERAL TIBIA Plateau;  Surgeon: Elner Hahn, MD;                Location: [facility];  Service: Orthopedics;  Laterality:               Right; 04/01/2016: KNEE ARTHROSCOPY WITH MENISCAL REPAIR; Right     Comment:  Procedure: KNEE ARTHROSCOPY WITH PARTIAL MEDIAL                MENISCAL REPAIR;  Surgeon: Elner Hahn, MD;  Location:                [facility];  Service: Orthopedics;  Laterality: Right; No date: TUBAL LIGATION 2016: TUMOR EXCISION     Comment:  BENIGN-HEAD   Reproductive/Obstetrics negative OB ROS                             Anesthesia Physical Anesthesia Plan  ASA: 3  Anesthesia Plan: General   Post-op Pain Management: Minimal or no pain anticipated   Induction: Intravenous  PONV Risk Score and Plan: 3 and Propofol  infusion, TIVA and Ondansetron   Airway Management Planned: Nasal Cannula  Additional Equipment: None  Intra-op Plan:   Post-operative Plan:   Informed Consent: I have reviewed the patients History and Physical, chart, labs and discussed the procedure including the risks, benefits and alternatives for the proposed anesthesia with the patient or authorized representative who has indicated his/her understanding and acceptance.     Dental advisory given  Plan Discussed with: CRNA and Surgeon  Anesthesia Plan Comments: (Discussed risks of anesthesia with patient, including possibility of difficulty with spontaneous ventilation under anesthesia necessitating airway intervention, PONV, and rare risks such as cardiac or respiratory or neurological events, and allergic reactions. Discussed the role of  CRNA in patient's perioperative care. Patient understands.)       Anesthesia Quick Evaluation

## 2023-08-18 NOTE — Anesthesia Postprocedure Evaluation (Signed)
 Anesthesia Post Note  Patient: Destiny Singh  Procedure(s) Performed: COLONOSCOPY WITH PROPOFOL  POLYPECTOMY, INTESTINE  Patient location during evaluation: Endoscopy Anesthesia Type: General Level of consciousness: awake and alert Pain management: pain level controlled Vital Signs Assessment: post-procedure vital signs reviewed and stable Respiratory status: spontaneous breathing, nonlabored ventilation, respiratory function stable and patient connected to nasal cannula oxygen Cardiovascular status: blood pressure returned to baseline and stable Postop Assessment: no apparent nausea or vomiting Anesthetic complications: no  No notable events documented.   Last Vitals:  Vitals:   08/18/23 1112 08/18/23 1122  BP: (!) 148/97 (!) 150/96  Pulse: 83 74  Resp: 19 19  Temp:    SpO2: 99% 99%    Last Pain:  Vitals:   08/18/23 1122  TempSrc:   PainSc: 0-No pain                 Enrique Harvest

## 2023-08-18 NOTE — Transfer of Care (Signed)
 Immediate Anesthesia Transfer of Care Note  Patient: Destiny Singh  Procedure(s) Performed: COLONOSCOPY WITH PROPOFOL  POLYPECTOMY, INTESTINE  Patient Location: PACU  Anesthesia Type:General  Level of Consciousness: sedated  Airway & Oxygen Therapy: Patient Spontanous Breathing  Post-op Assessment: Report given to RN and Post -op Vital signs reviewed and stable  Post vital signs: Reviewed and stable  Last Vitals:  Vitals Value Taken Time  BP 138/126 08/18/23 11:02  Temp    Pulse 89 08/18/23 11:04  Resp 20 08/18/23 11:04  SpO2 99 % 08/18/23 11:04    Last Pain:  Vitals:   08/18/23 1102  TempSrc: (P) Temporal  PainSc: (P) 0-No pain         Complications: No notable events documented.

## 2023-08-18 NOTE — Op Note (Signed)
 [facility] Gastroenterology Patient Name: Destiny Singh Procedure Date: 08/18/2023 10:35 AM MRN: [id number] Account #: [id number] Date of Birth: [birth year] Admit Type: Outpatient Age: 53 Room: [facility] ENDO ROOM 4 Gender: Female Note Status: Finalized Instrument Name: Charlyn Cooley [id number] Procedure:             Colonoscopy Indications:           Hematochezia Providers:             [name] MD, MD Referring MD:          Jacques Mattock (Referring MD) Medicines:             Propofol  per Anesthesia Complications:         No immediate complications. Procedure:             Pre-Anesthesia Assessment:                        - Prior to the procedure, a History and Physical was                         performed, and patient medications and allergies were                         reviewed. The patient's tolerance of previous                         anesthesia was also reviewed. The risks and benefits                         of the procedure and the sedation options and risks                         were discussed with the patient. All questions were                         answered, and informed consent was obtained. Prior                         Anticoagulants: The patient has taken no anticoagulant                         or antiplatelet agents. ASA Grade Assessment: II - A                         patient with mild systemic disease. After reviewing                         the risks and benefits, the patient was deemed in                         satisfactory condition to undergo the procedure.                        After obtaining informed consent, the colonoscope was                         passed under direct vision. Throughout the procedure,  the patient's blood pressure, pulse, and oxygen                         saturations were monitored continuously. The                         Colonoscope was introduced through the anus and                          advanced to the the cecum, identified by appendiceal                         orifice and ileocecal valve. The colonoscopy was                         performed without difficulty. The patient tolerated                         the procedure well. The quality of the bowel                         preparation was excellent. Findings:      The perianal and digital rectal examinations were normal.      A 6 mm polyp was found in the descending colon. The polyp was sessile.       The polyp was removed with a cold snare. Resection and retrieval were       complete.      Non-bleeding internal hemorrhoids were found during retroflexion. The       hemorrhoids were Grade II (internal hemorrhoids that prolapse but reduce       spontaneously). Impression:            - One 6 mm polyp in the descending colon, removed with                         a cold snare. Resected and retrieved.                        - Non-bleeding internal hemorrhoids. Recommendation:        - Discharge patient to home.                        - High fiber diet.                        - Continue present medications. Procedure Code(s):     --- Professional ---                        [phone], Colonoscopy, flexible; with removal of                         tumor(s), polyp(s), or other lesion(s) by snare                         technique Diagnosis Code(s):     --- Professional ---                        K92.1, Melena (includes Hematochezia)  D12.4, Benign neoplasm of descending colon CPT copyright 2022 [employer]. All rights reserved. The codes documented in this report are preliminary and upon coder review may  be revised to meet current compliance requirements. [name] MD, MD 08/18/2023 11:00:59 AM This report has been signed electronically. Number of Addenda: 0 Note Initiated On: 08/18/2023 10:35 AM Scope Withdrawal Time: 0 hours 7 minutes 41 seconds  Total Procedure Duration: 0 hours  12 minutes 36 seconds  Estimated Blood Loss:  Estimated blood loss: none.      [facility]

## 2023-08-19 LAB — SURGICAL PATHOLOGY

## 2023-08-22 ENCOUNTER — Ambulatory Visit: Payer: Self-pay | Admitting: Gastroenterology

## 2023-08-26 ENCOUNTER — Ambulatory Visit: Payer: Self-pay | Admitting: Surgery

## 2023-09-22 ENCOUNTER — Telehealth: Payer: Self-pay | Admitting: Physician Assistant

## 2023-09-22 NOTE — Telephone Encounter (Signed)
 Left vm and sent mychart message to confirm 09/29/23 appointment-Toni

## 2023-09-29 ENCOUNTER — Encounter: Payer: 59 | Admitting: Physician Assistant

## 2023-10-03 ENCOUNTER — Telehealth: Payer: Self-pay

## 2023-10-03 NOTE — Telephone Encounter (Signed)
 Patient called to request pain medication for her back. States she pulled a muscle at work. AA only one in office and completely booked, advised patient to go to urgent care if pain is severe. Also notified patient that she can call back to see if there has been any cancellations.

## 2023-11-17 ENCOUNTER — Telehealth: Payer: Self-pay | Admitting: Physician Assistant

## 2023-11-17 NOTE — Telephone Encounter (Signed)
 Left vm and sent mychart message to confirm 11/24/23 appointment-Toni

## 2023-11-24 ENCOUNTER — Other Ambulatory Visit: Payer: Self-pay

## 2023-11-24 ENCOUNTER — Encounter: Payer: Self-pay | Admitting: Emergency Medicine

## 2023-11-24 ENCOUNTER — Encounter: Payer: Self-pay | Admitting: Physician Assistant

## 2023-11-24 ENCOUNTER — Emergency Department

## 2023-11-24 ENCOUNTER — Emergency Department
Admission: EM | Admit: 2023-11-24 | Discharge: 2023-11-24 | Disposition: A | Discharge status: Home / Self Care | Attending: Emergency Medicine | Admitting: Emergency Medicine

## 2023-11-24 ENCOUNTER — Ambulatory Visit (INDEPENDENT_AMBULATORY_CARE_PROVIDER_SITE_OTHER): Admitting: Physician Assistant

## 2023-11-24 VITALS — BP 140/97 | HR 102 | Temp 97.8°F | Resp 16 | Ht 68.5 in | Wt 294.0 lb

## 2023-11-24 DIAGNOSIS — I10 Essential (primary) hypertension: Secondary | ICD-10-CM | POA: Diagnosis not present

## 2023-11-24 DIAGNOSIS — R0602 Shortness of breath: Secondary | ICD-10-CM | POA: Diagnosis not present

## 2023-11-24 DIAGNOSIS — Z86018 Personal history of other benign neoplasm: Secondary | ICD-10-CM | POA: Diagnosis not present

## 2023-11-24 DIAGNOSIS — R600 Localized edema: Secondary | ICD-10-CM | POA: Insufficient documentation

## 2023-11-24 DIAGNOSIS — R072 Precordial pain: Secondary | ICD-10-CM | POA: Diagnosis not present

## 2023-11-24 DIAGNOSIS — J209 Acute bronchitis, unspecified: Secondary | ICD-10-CM | POA: Insufficient documentation

## 2023-11-24 DIAGNOSIS — E119 Type 2 diabetes mellitus without complications: Secondary | ICD-10-CM | POA: Insufficient documentation

## 2023-11-24 DIAGNOSIS — R000 Tachycardia, unspecified: Secondary | ICD-10-CM | POA: Insufficient documentation

## 2023-11-24 DIAGNOSIS — R002 Palpitations: Secondary | ICD-10-CM | POA: Diagnosis not present

## 2023-11-24 LAB — TSH: TSH: 1.573 u[IU]/mL (ref 0.350–4.500)

## 2023-11-24 LAB — CBC
HCT: 43.4 % (ref 36.0–46.0)
Hemoglobin: 13.8 g/dL (ref 12.0–15.0)
MCH: 25.6 pg — ABNORMAL LOW (ref 26.0–34.0)
MCHC: 31.8 g/dL (ref 30.0–36.0)
MCV: 80.5 fL (ref 80.0–100.0)
Platelets: 370 K/uL (ref 150–400)
RBC: 5.39 MIL/uL — ABNORMAL HIGH (ref 3.87–5.11)
RDW: 15.4 % (ref 11.5–15.5)
WBC: 8.3 K/uL (ref 4.0–10.5)
nRBC: 0 % (ref 0.0–0.2)

## 2023-11-24 LAB — BASIC METABOLIC PANEL WITH GFR
Anion gap: 11 (ref 5–15)
BUN: 17 mg/dL (ref 6–20)
CO2: 26 mmol/L (ref 22–32)
Calcium: 9.5 mg/dL (ref 8.9–10.3)
Chloride: 101 mmol/L (ref 98–111)
Creatinine, Ser: 0.93 mg/dL (ref 0.44–1.00)
GFR, Estimated: >60 mL/min (ref >60)
Glucose, Bld: 171 mg/dL — ABNORMAL HIGH (ref 70–99)
Potassium: 3.9 mmol/L (ref 3.5–5.1)
Sodium: 138 mmol/L (ref 135–145)

## 2023-11-24 LAB — MAGNESIUM: Magnesium: 2.1 mg/dL (ref 1.7–2.4)

## 2023-11-24 LAB — TROPONIN I (HIGH SENSITIVITY)
Troponin I (High Sensitivity): 3 ng/L (ref <18)
Troponin I (High Sensitivity): 3 ng/L (ref <18)

## 2023-11-24 LAB — T4, FREE: Free T4: 2.82 ng/dL — ABNORMAL HIGH (ref 0.61–1.12)

## 2023-11-24 MED ORDER — PREDNISONE 20 MG PO TABS: 40.0000 mg | ORAL_TABLET | Freq: Every day | ORAL | 0 refills | Status: AC

## 2023-11-24 MED ORDER — AZITHROMYCIN 250 MG PO TABS: ORAL_TABLET | ORAL | 0 refills | Status: DC

## 2023-11-24 MED ORDER — IOHEXOL 350 MG/ML SOLN
75.0000 mL | Freq: Once | INTRAVENOUS | Status: AC | PRN
Start: 2023-11-24 — End: 2023-11-24
  Administered 2023-11-24: 75 mL via INTRAVENOUS

## 2023-11-24 MED ORDER — BENZONATATE 100 MG PO CAPS
100.0000 mg | ORAL_CAPSULE | Freq: Three times a day (TID) | ORAL | 0 refills | Status: AC | PRN
Start: 2023-11-24 — End: 2024-11-23

## 2023-11-24 MED ORDER — ALBUTEROL SULFATE HFA 108 (90 BASE) MCG/ACT IN AERS: 2.0000 | INHALATION_SPRAY | RESPIRATORY_TRACT | 0 refills | Status: DC | PRN

## 2023-11-24 NOTE — Progress Notes (Signed)
 [facility] [address] [place], [place] [address]  Internal MEDICINE  Office Visit Note  Patient Name: Destiny Singh  [id number]  [id number]  Date of Service: 11/24/2023  Chief Complaint  Patient presents with   Acute Visit   Shortness of Breath   Chest Pain    HPI Pt was initially here for physical, but converted to acute visit given symptoms -She had palpitations Tuesday and Wednesday then CP started after this and has been constant since yesterday -states she has had this before with some palpitations and then brief CP but will go away quickly and this is different and not going away. Some back pain. No neck or arm radiation. Substernal pain -Also can't take a deep breath, more SOB for the last 1.5 weeks. Oxygen at 93% in office -no sick contacts, no fever or chills -sweating during palpitations since Tuesday -took her metoprolol  BID and didn't help -her husband is with her and drove her and discussed going to ED for further work up  Current Medication: Outpatient Encounter Medications as of 11/24/2023  Medication Sig   ASHWAGANDHA 35 PO Take by mouth.   cholecalciferol (VITAMIN D3) 25 MCG (1000 UNIT) tablet Take 1,000 Units by mouth daily.   cyanocobalamin  (,VITAMIN B-12,) 1000 MCG/ML injection Inject 1,000 mcg into the muscle See admin instructions. Every 2 months   Dulaglutide  (TRULICITY ) 4.5 MG/0.5ML SOAJ Inject 4.5 mg as directed once a week.   ERGOCALCIFEROL  PO Take by mouth.   ibuprofen  (ADVIL ) 800 MG tablet Take 1 tablet (800 mg total) by mouth every 8 (eight) hours as needed for mild pain.   metoprolol  tartrate (LOPRESSOR ) 50 MG tablet TAKE 1 TABLET BY MOUTH TWICE A Su[place]am   nortriptyline (PAMELOR) 50 MG capsule Take 50 mg by mouth at bedtime.   pregabalin (LYRICA) 50 MG capsule Take 50 mg by mouth 2 (two) times daily.   No facility-administered encounter medications on file as of 11/24/2023.    Surgical History: Past Surgical History:  Procedure  Laterality Date   BREAST EXCISIONAL BIOPSY     in teens per pt   BUNIONECTOMY Right 10/01/2021   Procedure: BUNIONECTOMY - LAPIDUS - TYPE;  Surgeon: Lennie Barter, DPM;  Location: [facility];  Service: Podiatry;  Laterality: Right;   CALCANEAL OSTEOTOMY Right 10/01/2021   Procedure: EVANS/MCDO;  Surgeon: Lennie Barter, DPM;  Location: [facility];  Service: Podiatry;  Laterality: Right;   CHONDROPLASTY Right 04/01/2016   Procedure: CHONDROPLASTY -ABRASION OF FEMORAL TROCHLEA, LATERAL TIBIA Plateau;  Surgeon: Norleen JINNY Maltos, MD;  Location: [facility];  Service: Orthopedics;  Laterality: Right;   COLONOSCOPY WITH PROPOFOL  N/A 08/18/2023   Procedure: COLONOSCOPY WITH PROPOFOL ;  Surgeon: Jinny Carmine, MD;  Location: [facility];  Service: Endoscopy;  Laterality: N/A;   GASTROC RECESSION EXTREMITY Right 10/01/2021   Procedure: GASTROC RECESSION;  Surgeon: Lennie Barter, DPM;  Location: [facility];  Service: Podiatry;  Laterality: Right;   KNEE ARTHROSCOPY Left 06/18/2020   Procedure: LEFT KNEE ARTHROSCOPY WITH DEBRIDEMENT AND PARTIAL MEDIAL MENISCECTOMY;  Surgeon: Maltos Norleen JINNY, MD;  Location: [facility];  Service: Orthopedics;  Laterality: Left;   KNEE ARTHROSCOPY WITH MENISCAL REPAIR Right 04/01/2016   Procedure: KNEE ARTHROSCOPY WITH PARTIAL MEDIAL  MENISCAL REPAIR;  Surgeon: Norleen JINNY Maltos, MD;  Location: [facility];  Service: Orthopedics;  Laterality: Right;   POLYPECTOMY  08/18/2023   Procedure: POLYPECTOMY, INTESTINE;  Surgeon: Jinny Carmine, MD;  Location: [facility];  Service: Endoscopy;;   TENDON TRANSFER  Right 10/01/2021   Procedure: FDL TRANSFER; DEEP;  Surgeon: Lennie Barter, DPM;  Location: [facility];  Service: Podiatry;  Laterality: Right;   TENOTOMY / FLEXOR TENDON TRANSFER Right 10/01/2021   Procedure: FLEXOR TENDON REPAIR;  Surgeon: Lennie Barter, DPM;  Location: [facility];  Service: Podiatry;  Laterality: Right;   TUBAL LIGATION     TUMOR  EXCISION  2016   BENIGN-HEAD    Medical History: Past Medical History:  Diagnosis Date   Adopted    Arthritis    BOTH KNEES   Asthma    Benign tumor    Diabetes mellitus without complication (HCC)    Dyspnea    VERY RARE   Dysrhythmia    IRREGULAR PER PT   Headache    Hypertension    Myocardial infarction (HCC)    AGE 53   Sickle cell trait (HCC)    Sleep apnea     Family History: Family History  Adopted: Yes    Social History   Socioeconomic History   Marital status: Single    Spouse name: Not on file   Number of children: Not on file   Years of education: Not on file   Highest education level: Not on file  Occupational History   Not on file  Tobacco Use   Smoking status: Never   Smokeless tobacco: Never  Vaping Use   Vaping status: Never Used  Substance and Sexual Activity   Alcohol use: No   Drug use: No   Sexual activity: Not on file  Other Topics Concern   Not on file  Social History Narrative   Not on file   Social Drivers of Health   Financial Resource Strain: Low Risk  (06/08/2023)   Received from [facility]   Overall Financial Resource Strain (CARDIA)    Difficulty of Paying Living Expenses: Not hard at all  Food Insecurity: No Food Insecurity (06/08/2023)   Received from [facility]   Hunger Vital Sign    Within the past 12 months, you worried that your food would run out before you got the money to buy more.: Never true    Within the past 12 months, the food you bought just didn't last and you didn't have money to get more.: Never true  Transportation Needs: No Transportation Needs (06/08/2023)   Received from [facility] - Transportation    In the past 12 months, has lack of transportation kept you from medical appointments or from getting medications?: No    Lack of Transportation (Non-Medical): No  Physical Activity: Not on file  Stress: Not on file  Social Connections: Not  on file  Intimate Partner Violence: Not on file      Review of Systems  Constitutional:  Positive for diaphoresis. Negative for fever.  HENT:  Negative for congestion, mouth sores, postnasal drip and sinus pressure.   Respiratory:  Positive for shortness of breath.   Cardiovascular:  Positive for chest pain and palpitations.  Genitourinary:  Negative for flank pain.  Musculoskeletal:  Positive for back pain.  Psychiatric/Behavioral: Negative.      Vital Signs: BP (!) 140/97   Pulse (!) 102   Temp 97.8 F (36.6 C)   Resp 16   Ht 5' 8.5 (1.74 m)   Wt 294 lb (133.4 kg)   LMP 11/23/2018 (Approximate)   SpO2 93%   BMI 44.05 kg/m    Physical Exam Vitals and nursing  note reviewed.  Constitutional:      Appearance: Normal appearance. She is obese. She is ill-appearing and diaphoretic.  HENT:     Head: Normocephalic and atraumatic.  Eyes:     Extraocular Movements: Extraocular movements intact.  Cardiovascular:     Rate and Rhythm: Regular rhythm. Tachycardia present.  Pulmonary:     Effort: Pulmonary effort is normal.     Comments: Exam limited by pt unable to take deep breaths without coughing Skin:    General: Skin is warm.  Neurological:     Mental Status: She is alert and oriented to person, place, and time.  Psychiatric:        Thought Content: Thought content normal.        Assessment/Plan: 1. Substernal chest pain (Primary) Due to ongoing CP for over 12 hours without any relief and inability to take deep breaths with oxygen at 93% will send to ED for further work up. Her husband is with her and will drive her now. They decline EMS - EKG 12-Lead  2. Shortness of breath Due to ongoing CP for over 12 hours without any relief and inability to take deep breaths with oxygen at 93% will send to ED for further work up. Likely will need CTA to r/o PE. Her husband is with her and will drive her now. They decline EMS - EKG 12-Lead  3. Palpitations May need med  adjustment/cardiology evaluation given progression despite metoprolol  use   General Counseling: Destiny Singh verbalizes understanding of the findings of todays visit and agrees with plan of treatment. I have discussed any further diagnostic evaluation that may be needed or ordered today. We also reviewed her medications today. she has been encouraged to call the office with any questions or concerns that should arise related to todays visit.    Orders Placed This Encounter  Procedures   EKG 12-Lead    No orders of the defined types were placed in this encounter.   This patient was seen by Tinnie Pro, PA-C in collaboration with Dr. Sigrid Bathe as a part of collaborative care agreement.   Total time spent:45 Minutes Time spent includes review of chart, medications, test results, and follow up plan with the patient.      Dr Fozia M Khan Internal medicine

## 2023-11-24 NOTE — ED Provider Notes (Signed)
 [facility] Provider Note    Event Date/Time   First MD Initiated Contact with Patient 11/24/23 1045     (approximate)   History   Chief Complaint: Chest Pain   HPI  Destiny Singh is a 53 y.o. female with a history of diabetes, hypertension who comes ED complaining of shortness of breath and sharp chest pain with deep breathing.  Ongoing for the last 10 days.  Associated nonproductive cough.  No fever or chills.  Not exertional.  No orthopnea.  Denies history of DVT/PE, no recent travel trauma hospitalization or surgery.      Past Medical History:  Diagnosis Date   Adopted    Arthritis    BOTH KNEES   Asthma    Benign tumor    Diabetes mellitus without complication (HCC)    Dyspnea    VERY RARE   Dysrhythmia    IRREGULAR PER PT   Headache    Hypertension    Myocardial infarction (HCC)    AGE 16   Sickle cell trait [facility])    Sleep apnea     Current Outpatient Rx   Order #: [id number] Class: Normal   Order #: [id number] Class: Historical Med   Order #: [id number] Class: Normal   Order #: [id number] Class: Historical Med   Order #: [id number] Class: Historical Med   Order #: [id number] Class: Normal   Order #: [id number] Class: Historical Med   Order #: [id number] Class: Print   Order #: [id number] Class: Normal   Order #: [id number] Class: Historical Med   Order #: [id number] Class: Normal   Order #: [id number] Class: Historical Med    Past Surgical History:  Procedure Laterality Date   BREAST EXCISIONAL BIOPSY     in teens per pt   BUNIONECTOMY Right 10/01/2021   Procedure: ROMAYNE - LAPIDUS - TYPE;  Surgeon: Lennie Barter, DPM;  Location: [facility];  Service: Podiatry;  Laterality: Right;   CALCANEAL OSTEOTOMY Right 10/01/2021   Procedure: EVANS/MCDO;  Surgeon: Lennie Barter, DPM;  Location: [facility];  Service: Podiatry;  Laterality: Right;   CHONDROPLASTY Right 04/01/2016   Procedure: CHONDROPLASTY -ABRASION OF FEMORAL  TROCHLEA, LATERAL TIBIA Plateau;  Surgeon: Norleen JINNY Maltos, MD;  Location: [facility];  Service: Orthopedics;  Laterality: Right;   COLONOSCOPY WITH PROPOFOL  N/A 08/18/2023   Procedure: COLONOSCOPY WITH PROPOFOL ;  Surgeon: Jinny Carmine, MD;  Location: [facility];  Service: Endoscopy;  Laterality: N/A;   GASTROC RECESSION EXTREMITY Right 10/01/2021   Procedure: GASTROC RECESSION;  Surgeon: Lennie Barter, DPM;  Location: [facility];  Service: Podiatry;  Laterality: Right;   KNEE ARTHROSCOPY Left 06/18/2020   Procedure: LEFT KNEE ARTHROSCOPY WITH DEBRIDEMENT AND PARTIAL MEDIAL MENISCECTOMY;  Surgeon: Maltos Norleen JINNY, MD;  Location: [facility];  Service: Orthopedics;  Laterality: Left;   KNEE ARTHROSCOPY WITH MENISCAL REPAIR Right 04/01/2016   Procedure: KNEE ARTHROSCOPY WITH PARTIAL MEDIAL  MENISCAL REPAIR;  Surgeon: Norleen JINNY Maltos, MD;  Location: [facility];  Service: Orthopedics;  Laterality: Right;   POLYPECTOMY  08/18/2023   Procedure: POLYPECTOMY, INTESTINE;  Surgeon: Jinny Carmine, MD;  Location: [facility];  Service: Endoscopy;;   TENDON TRANSFER Right 10/01/2021   Procedure: FDL TRANSFER; DEEP;  Surgeon: Lennie Barter, DPM;  Location: [facility];  Service: Podiatry;  Laterality: Right;   TENOTOMY / FLEXOR TENDON TRANSFER Right 10/01/2021   Procedure: FLEXOR TENDON REPAIR;  Surgeon: Lennie Barter, DPM;  Location: [facility];  Service: Podiatry;  Laterality: Right;   TUBAL LIGATION  TUMOR EXCISION  2016   BENIGN-HEAD    Physical Exam   Triage Vital Signs: ED Triage Vitals  Encounter Vitals Group     BP 11/24/23 1018 (!) 156/102     Girls Systolic BP Percentile --      Girls Diastolic BP Percentile --      Boys Systolic BP Percentile --      Boys Diastolic BP Percentile --      Pulse Rate 11/24/23 1018 (!) 101     Resp 11/24/23 1018 18     Temp 11/24/23 1018 98.1 F (36.7 C)     Temp Source 11/24/23 1018 Oral     SpO2 11/24/23 1018 96 %     Weight 11/24/23  1017 290 lb (131.5 kg)     Height 11/24/23 1017 5' 8.5 (1.74 m)     Head Circumference --      Peak Flow --      Pain Score 11/24/23 1017 8     Pain Loc --      Pain Education --      Exclude from Growth Chart --     Most recent vital signs: Vitals:   11/24/23 1047 11/24/23 1048  BP:  (!) 145/83  Pulse:  98  Resp:  18  Temp:    SpO2: 95% 95%    General: Awake, no distress.  CV:  Good peripheral perfusion.  Tachycardia heart rate 100 Resp:  Normal effort.  Clear to auscultation bilaterally except for wheezing with cough in the left base. Abd:  No distention.  Soft nontender Other:  Trace peripheral edema, symmetric.  No calf tenderness.  Chronic per patient.   ED Results / Procedures / Treatments   Labs (all labs ordered are listed, but only abnormal results are displayed) Labs Reviewed  BASIC METABOLIC PANEL WITH GFR - Abnormal; Notable for the following components:      Result Value   Glucose, Bld 171 (*)    All other components within normal limits  CBC - Abnormal; Notable for the following components:   RBC 5.39 (*)    MCH 25.6 (*)    All other components within normal limits  T4, FREE - Abnormal; Notable for the following components:   Free T4 2.82 (*)    All other components within normal limits  TSH  MAGNESIUM  TROPONIN I (HIGH SENSITIVITY)  TROPONIN I (HIGH SENSITIVITY)     EKG Interpreted by me Sinus tachycardia rate 101.  Normal axis intervals QRS ST segments T waves   RADIOLOGY Chest x-ray interpreted by me, shows small opacity at the left base, radiology report reviewed   PROCEDURES:  Procedures   MEDICATIONS ORDERED IN ED: Medications  iohexol  (OMNIPAQUE ) 350 MG/ML injection 75 mL (75 mLs Intravenous Contrast Given 11/24/23 1407)     IMPRESSION / MDM / ASSESSMENT AND PLAN / ED COURSE  I reviewed the triage vital signs and the nursing notes.  DDx: Pneumonia, bronchitis, pulmonary embolism, electrolyte derangement, anemia, non-STEMI,  hypothyroidism  Patient's presentation is most consistent with acute presentation with potential threat to life or bodily function.  Patient presents with pleuritic chest pain, shortness of breath, palpitations.  Currently has mild tachycardia, did not sinus rhythm with otherwise reassuring vital signs.  Mild tachycardia, monitor shows sinus rhythm with otherwise reassuring vital signs.  Will need to workup for PE with CT chest.   ----------------------------------------- 2:46 PM on 11/24/2023 ----------------------------------------- CTA chest negative.  Will cover with prednisone , inhaler, Tessalon , follow-up with  primary care.      FINAL CLINICAL IMPRESSION(S) / ED DIAGNOSES   Final diagnoses:  Acute bronchitis, unspecified organism     Rx / DC Orders   ED Discharge Orders          Ordered    predniSONE  (DELTASONE ) 20 MG tablet  Daily with breakfast        11/24/23 1445    albuterol  (PROVENTIL  HFA) 108 (90 Base) MCG/ACT inhaler  Every 4 hours PRN        11/24/23 1445    benzonatate  (TESSALON  PERLES) 100 MG capsule  3 times daily PRN        11/24/23 1445             Note:  This document was prepared using Dragon voice recognition software and may include unintentional dictation errors.   Viviann Pastor, MD 11/24/23 [phone]

## 2023-11-24 NOTE — ED Notes (Signed)
 Warm blankets provided by This RN.

## 2023-11-24 NOTE — ED Triage Notes (Signed)
 Pt via POV from doctors appointment. Reports she has been having intermittent palpitations for the past year, states that she has pain when she takes a deep breath. Denies any sick contacts but also reports a cough. C/o L sided chest pain, non-radiating. Denies blood thinners. Pt is A&Ox4 and NAD

## 2023-11-25 ENCOUNTER — Telehealth: Payer: Self-pay | Admitting: Physician Assistant

## 2023-11-25 NOTE — Telephone Encounter (Signed)
 Lvm to schedule ED follow up & cpe-tw

## 2023-11-30 ENCOUNTER — Ambulatory Visit: Admitting: Nurse Practitioner

## 2023-12-02 ENCOUNTER — Encounter: Payer: Self-pay | Admitting: Nurse Practitioner

## 2023-12-02 ENCOUNTER — Ambulatory Visit: Admitting: Nurse Practitioner

## 2023-12-02 VITALS — BP 138/82 | HR 100 | Temp 97.5°F | Resp 16 | Ht 68.5 in | Wt 295.8 lb

## 2023-12-02 DIAGNOSIS — J209 Acute bronchitis, unspecified: Secondary | ICD-10-CM

## 2023-12-02 DIAGNOSIS — R7989 Other specified abnormal findings of blood chemistry: Secondary | ICD-10-CM

## 2023-12-02 DIAGNOSIS — I10 Essential (primary) hypertension: Secondary | ICD-10-CM | POA: Diagnosis not present

## 2023-12-02 NOTE — Progress Notes (Signed)
 [facility] [address] [place], [place] [address]  Internal MEDICINE  Office Visit Note  Patient Name: Destiny Singh  [id number]  [id number]  Date of Service: 12/02/2023  Chief Complaint  Patient presents with   Follow-up    ED f/u     HPI Teara presents for a follow-up visit for recent ED visit for bronchitis.  Recent ED visit -- diagnosed with bronchitis and given antibiotics, prednisone , inhaler and cough medication. She reports that she is feeling better now.  She did have an elevated free T4 but normal TSH so this will need to be reevaluated. No having any symptoms.   Elevated BP -- improved when rechecked   Current Medication: Outpatient Encounter Medications as of 12/02/2023  Medication Sig   albuterol  (PROVENTIL  HFA) 108 (90 Base) MCG/ACT inhaler Inhale 2 puffs into the lungs every 4 (four) hours as needed for wheezing or shortness of breath.   ASHWAGANDHA 35 PO Take by mouth.   azithromycin  (ZITHROMAX  Z-PAK) 250 MG tablet Take 2 tablets (500 mg) on  [place] 1,  followed by 1 tablet (250 mg) once daily on Days 2 through 5.   benzonatate  (TESSALON  PERLES) 100 MG capsule Take 1 capsule (100 mg total) by mouth 3 (three) times daily as needed for cough.   cholecalciferol (VITAMIN D3) 25 MCG (1000 UNIT) tablet Take 1,000 Units by mouth daily.   cyanocobalamin  (,VITAMIN B-12,) 1000 MCG/ML injection Inject 1,000 mcg into the muscle See admin instructions. Every 2 months   Dulaglutide  (TRULICITY ) 4.5 MG/0.5ML SOAJ Inject 4.5 mg as directed once a week.   ERGOCALCIFEROL  PO Take by mouth.   ibuprofen  (ADVIL ) 800 MG tablet Take 1 tablet (800 mg total) by mouth every 8 (eight) hours as needed for mild pain.   metoprolol  tartrate (LOPRESSOR ) 50 MG tablet TAKE 1 TABLET BY MOUTH TWICE A [place]   nortriptyline (PAMELOR) 50 MG capsule Take 50 mg by mouth at bedtime.   pregabalin (LYRICA) 50 MG capsule Take 50 mg by mouth 2 (two) times daily.   No facility-administered encounter  medications on file as of 12/02/2023.    Surgical History: Past Surgical History:  Procedure Laterality Date   BREAST EXCISIONAL BIOPSY     in teens per pt   BUNIONECTOMY Right 10/01/2021   Procedure: BUNIONECTOMY - LAPIDUS - TYPE;  Surgeon: Lennie Barter, DPM;  Location: [facility];  Service: Podiatry;  Laterality: Right;   CALCANEAL OSTEOTOMY Right 10/01/2021   Procedure: EVANS/MCDO;  Surgeon: Lennie Barter, DPM;  Location: [facility];  Service: Podiatry;  Laterality: Right;   CHONDROPLASTY Right 04/01/2016   Procedure: CHONDROPLASTY -ABRASION OF FEMORAL TROCHLEA, LATERAL TIBIA Plateau;  Surgeon: Norleen JINNY Maltos, MD;  Location: [facility] ORS;  Service: Orthopedics;  Laterality: Right;   COLONOSCOPY WITH PROPOFOL  N/A 08/18/2023   Procedure: COLONOSCOPY WITH PROPOFOL ;  Surgeon: Jinny Carmine, MD;  Location: [facility] ENDOSCOPY;  Service: Endoscopy;  Laterality: N/A;   GASTROC RECESSION EXTREMITY Right 10/01/2021   Procedure: GASTROC RECESSION;  Surgeon: Lennie Barter, DPM;  Location: [facility];  Service: Podiatry;  Laterality: Right;   KNEE ARTHROSCOPY Left 06/18/2020   Procedure: LEFT KNEE ARTHROSCOPY WITH DEBRIDEMENT AND PARTIAL MEDIAL MENISCECTOMY;  Surgeon: Maltos Norleen JINNY, MD;  Location: [facility] ORS;  Service: Orthopedics;  Laterality: Left;   KNEE ARTHROSCOPY WITH MENISCAL REPAIR Right 04/01/2016   Procedure: KNEE ARTHROSCOPY WITH PARTIAL MEDIAL  MENISCAL REPAIR;  Surgeon: Norleen JINNY Maltos, MD;  Location: [facility] ORS;  Service: Orthopedics;  Laterality: Right;  POLYPECTOMY  08/18/2023   Procedure: POLYPECTOMY, INTESTINE;  Surgeon: Jinny Carmine, MD;  Location: [facility] END[facility]COPY;  Service: Endoscopy;;   TENDON TRANSFER Right 10/01/2021   Procedure: FDL TRANSFER; DEEP;  Surgeon: Lennie Barter, DPM;  Location: [facility];  Service: Podiatry;  Laterality: Right;   TENOTOMY / FLEXOR TENDON TRANSFER Right 10/01/2021   Procedure: FLEXOR TENDON REPAIR;  Surgeon: Lennie Barter, DPM;   Location: [facility];  Service: Podiatry;  Laterality: Right;   TUBAL LIGATION     TUMOR EXCISION  2016   BENIGN-HEAD    Medical History: Past Medical History:  Diagnosis Date   Adopted    Arthritis    BOTH KNEES   Asthma    Benign tumor    Diabetes mellitus without complication (HCC)    Dyspnea    VERY RARE   Dysrhythmia    IRREGULAR PER PT   Headache    Hypertension    Myocardial infarction (HCC)    AGE 9   Sickle cell trait    Sleep apnea     Family History: Family History  Adopted: Yes    Social History   Socioeconomic History   Marital status: Single    Spouse name: Not on file   Number of children: Not on file   Years of education: Not on file   Highest education level: Not on file  Occupational History   Not on file  Tobacco Use   Smoking status: Never   Smokeless tobacco: Never  Vaping Use   Vaping status: Never Used  Substance and Sexual Activity   Alcohol use: No   Drug use: No   Sexual activity: Not on file  Other Topics Concern   Not on file  Social History Narrative   Not on file   Social Drivers of Health   Financial Resource Strain: Low Risk  (06/08/2023)   Received from [facility]   Overall Financial Resource Strain (CARDIA)    Difficulty of Paying Living Expenses: Not hard at all  Food Insecurity: No Food Insecurity (06/08/2023)   Received from [facility]   Hunger Vital Sign    Within the past 12 months, you worried that your food would run out before you got the money to buy more.: Never true    Within the past 12 months, the food you bought just didn't last and you didn't have money to get more.: Never true  Transportation Needs: No Transportation Needs (06/08/2023)   Received from [facility] - Transportation    In the past 12 months, has lack of transportation kept you from medical appointments or from getting medications?: No    Lack of Transportation  (Non-Medical): No  Physical Activity: Not on file  Stress: Not on file  Social Connections: Not on file  Intimate Partner Violence: Not on file      Review of Systems  Constitutional:  Positive for fatigue. Negative for chills and unexpected weight change.  HENT:  Positive for congestion and postnasal drip. Negative for rhinorrhea, sneezing and sore throat.   Eyes:  Negative for redness.  Respiratory:  Positive for cough, chest tightness, shortness of breath and wheezing.   Cardiovascular:  Negative for chest pain and palpitations.  Gastrointestinal:  Negative for abdominal pain, constipation, diarrhea, nausea and vomiting.  Genitourinary:  Negative for dysuria and frequency.  Musculoskeletal:  Negative for arthralgias, back pain, joint swelling and neck pain.  Skin:  Negative for  rash.  Neurological: Negative.  Negative for tremors and numbness.  Hematological:  Negative for adenopathy. Does not bruise/bleed easily.  Psychiatric/Behavioral:  Negative for behavioral problems (Depression), sleep disturbance and suicidal ideas. The patient is not nervous/anxious.     Vital Signs: BP 138/82 Comment: 140/96  Pulse 100   Temp (!) 97.5 F (36.4 C)   Resp 16   Ht 5' 8.5 (1.74 m)   Wt 295 lb 12.8 oz (134.2 kg)   LMP 11/23/2018 (Approximate)   SpO2 97%   BMI 44.32 kg/m    Physical Exam Vitals reviewed.  Constitutional:      General: She is not in acute distress.    Appearance: Normal appearance. She is obese. She is not ill-appearing.  HENT:     Head: Normocephalic and atraumatic.  Eyes:     Pupils: Pupils are equal, round, and reactive to light.  Cardiovascular:     Rate and Rhythm: Normal rate and regular rhythm.     Heart sounds: Normal heart sounds. No murmur heard. Pulmonary:     Effort: Pulmonary effort is normal. No respiratory distress.     Breath sounds: Normal breath sounds. No stridor. No wheezing, rhonchi or rales.  Chest:     Chest wall: No tenderness.   Skin:    Capillary Refill: Capillary refill takes less than 2 seconds.  Neurological:     Mental Status: She is alert and oriented to person, place, and time.  Psychiatric:        Mood and Affect: Mood normal.        Behavior: Behavior normal.        Assessment/Plan: 1. Acute bronchitis, unspecified organism (Primary) Improving, continue medications as prescribed by the ER.   2. Essential hypertension Stable, continue medications as prescribed.   3. Elevated serum free T4 level Elevated free T4 but asymptomatic. Will discuss with patient at her next visit to repeat labs and possbily order additional labs.    General Counseling: Destiny Singh verbalizes understanding of the findings of todays visit and agrees with plan of treatment. I have discussed any further diagnostic evaluation that may be needed or ordered today. We also reviewed her medications today. she has been encouraged to call the office with any questions or concerns that should arise related to todays visit.    No orders of the defined types were placed in this encounter.   No orders of the defined types were placed in this encounter.   Return for has appt with lauren in november. .   Total time spent:30 Minutes Time spent includes review of chart, medications, test results, and follow up plan with the patient.   [place] Controlled Substance Database was reviewed by me.  This patient was seen by Mardy Maxin, FNP-C in collaboration with Dr. Sigrid Bathe as a part of collaborative care agreement.   Henretter Piekarski R. Maxin, MSN, FNP-C Internal medicine

## 2024-01-05 ENCOUNTER — Telehealth: Payer: Self-pay | Admitting: Physician Assistant

## 2024-01-05 NOTE — Telephone Encounter (Signed)
 Left vm and sent mychart message to confirm 01/12/24 appointment-Toni

## 2024-01-10 ENCOUNTER — Other Ambulatory Visit: Payer: Self-pay

## 2024-01-10 MED ORDER — ALBUTEROL SULFATE HFA 108 (90 BASE) MCG/ACT IN AERS: 2.0000 | INHALATION_SPRAY | RESPIRATORY_TRACT | 0 refills | Status: AC | PRN

## 2024-01-12 ENCOUNTER — Encounter: Admitting: Physician Assistant

## 2024-02-09 ENCOUNTER — Encounter: Admitting: Physician Assistant

## 2024-02-17 ENCOUNTER — Encounter: Payer: Self-pay | Admitting: Physician Assistant

## 2024-02-17 ENCOUNTER — Ambulatory Visit: Admitting: Physician Assistant

## 2024-02-17 VITALS — BP 110/70 | HR 76 | Temp 98.0°F | Resp 16 | Ht 68.5 in | Wt 296.0 lb

## 2024-02-17 DIAGNOSIS — R300 Dysuria: Secondary | ICD-10-CM

## 2024-02-17 DIAGNOSIS — Z0001 Encounter for general adult medical examination with abnormal findings: Secondary | ICD-10-CM

## 2024-02-17 DIAGNOSIS — I10 Essential (primary) hypertension: Secondary | ICD-10-CM

## 2024-02-17 DIAGNOSIS — R002 Palpitations: Secondary | ICD-10-CM

## 2024-02-17 DIAGNOSIS — E119 Type 2 diabetes mellitus without complications: Secondary | ICD-10-CM

## 2024-02-17 MED ORDER — METOPROLOL TARTRATE 50 MG PO TABS: 50.0000 mg | ORAL_TABLET | Freq: Two times a day (BID) | ORAL | 2 refills | Status: AC

## 2024-02-17 MED ORDER — LOSARTAN POTASSIUM-HCTZ 100-25 MG PO TABS: 1.0000 | ORAL_TABLET | Freq: Every day | ORAL | 1 refills | Status: AC

## 2024-02-17 NOTE — Progress Notes (Unsigned)
 [facility] [address] [place], [place] [address]  Internal MEDICINE  Office Visit Note  Patient Name: Destiny Singh  [id number]  [id number]  Date of Service: 02/17/2024  Chief Complaint  Patient presents with   Annual Exam   Diabetes   Hypertension   Quality Metric Gaps    Eye Exam     HPI Pt is here for routine health maintenance examination -BP well controlled -Eye exam needed and she will schedule -insurance stopped and new policy doesn't start until Jan 1, states new policy will cover mounajro for future -did have some palpitations on Sat when looking for a resident who got loose, so her work did check an EKG but she declined to go to ED.  -Doing better today, no CP. Declines EKG today. -will need zio and echo once insurance back  Current Medication: Outpatient Encounter Medications as of 02/17/2024  Medication Sig   albuterol  (PROVENTIL  HFA) 108 (90 Base) MCG/ACT inhaler Inhale 2 puffs into the lungs every 4 (four) hours as needed for wheezing or shortness of breath.   ASHWAGANDHA 35 PO Take by mouth.   azithromycin  (ZITHROMAX  Z-PAK) 250 MG tablet Take 2 tablets (500 mg) on  [place] 1,  followed by 1 tablet (250 mg) once daily on Days 2 through 5.   benzonatate  (TESSALON  PERLES) 100 MG capsule Take 1 capsule (100 mg total) by mouth 3 (three) times daily as needed for cough.   cholecalciferol (VITAMIN D3) 25 MCG (1000 UNIT) tablet Take 1,000 Units by mouth daily.   cyanocobalamin  (,VITAMIN B-12,) 1000 MCG/ML injection Inject 1,000 mcg into the muscle See admin instructions. Every 2 months   Dulaglutide  (TRULICITY ) 4.5 MG/0.5ML SOAJ Inject 4.5 mg as directed once a week.   ERGOCALCIFEROL  PO Take by mouth.   ibuprofen  (ADVIL ) 800 MG tablet Take 1 tablet (800 mg total) by mouth every 8 (eight) hours as needed for mild pain.   metoprolol  tartrate (LOPRESSOR ) 50 MG tablet TAKE 1 TABLET BY MOUTH TWICE A [place]   Misc Natural Products (PUMPKIN SEED OIL) CAPS Take by  mouth in the morning, at noon, and at bedtime.   nortriptyline (PAMELOR) 50 MG capsule Take 50 mg by mouth at bedtime.   pregabalin (LYRICA) 50 MG capsule Take 50 mg by mouth 2 (two) times daily.   No facility-administered encounter medications on file as of 02/17/2024.    Surgical History: Past Surgical History:  Procedure Laterality Date   BREAST EXCISIONAL BIOPSY     in teens per pt   BUNIONECTOMY Right 10/01/2021   Procedure: BUNIONECTOMY - LAPIDUS - TYPE;  Surgeon: Lennie Barter, DPM;  Location: [facility];  Service: Podiatry;  Laterality: Right;   CALCANEAL OSTEOTOMY Right 10/01/2021   Procedure: EVANS/MCDO;  Surgeon: Lennie Barter, DPM;  Location: [facility];  Service: Podiatry;  Laterality: Right;   CHONDROPLASTY Right 04/01/2016   Procedure: CHONDROPLASTY -ABRASION OF FEMORAL TROCHLEA, LATERAL TIBIA Plateau;  Surgeon: Norleen JINNY Maltos, MD;  Location: [facility] ORS;  Service: Orthopedics;  Laterality: Right;   COLONOSCOPY WITH PROPOFOL  N/A 08/18/2023   Procedure: COLONOSCOPY WITH PROPOFOL ;  Surgeon: Jinny Carmine, MD;  Location: [facility] ENDOSCOPY;  Service: Endoscopy;  Laterality: N/A;   GASTROC RECESSION EXTREMITY Right 10/01/2021   Procedure: GASTROC RECESSION;  Surgeon: Lennie Barter, DPM;  Location: [facility];  Service: Podiatry;  Laterality: Right;   KNEE ARTHROSCOPY Left 06/18/2020   Procedure: LEFT KNEE ARTHROSCOPY WITH DEBRIDEMENT AND PARTIAL MEDIAL MENISCECTOMY;  Surgeon: Maltos Norleen JINNY, MD;  Location: [facility] OR[facility];  Service: Orthopedics;  Laterality: Left;   KNEE ARTHROSCOPY WITH MENISCAL REPAIR Right 04/01/2016   Procedure: KNEE ARTHROSCOPY WITH PARTIAL MEDIAL  MENISCAL REPAIR;  Surgeon: Norleen JINNY Maltos, MD;  Location: [facility] ORS;  Service: Orthopedics;  Laterality: Right;   POLYPECTOMY  08/18/2023   Procedure: POLYPECTOMY, INTESTINE;  Surgeon: Jinny Carmine, MD;  Location: [facility] ENDOSCOPY;  Service: Endoscopy;;   TENDON TRANSFER Right 10/01/2021   Procedure: FDL  TRANSFER; DEEP;  Surgeon: Lennie Barter, DPM;  Location: [facility];  Service: Podiatry;  Laterality: Right;   TENOTOMY / FLEXOR TENDON TRANSFER Right 10/01/2021   Procedure: FLEXOR TENDON REPAIR;  Surgeon: Lennie Barter, DPM;  Location: [facility];  Service: Podiatry;  Laterality: Right;   TUBAL LIGATION     TUMOR EXCISION  2016   BENIGN-HEAD    Medical History: Past Medical History:  Diagnosis Date   Adopted    Arthritis    BOTH KNEES   Asthma    Benign tumor    Diabetes mellitus without complication (HCC)    Dyspnea    VERY RARE   Dysrhythmia    IRREGULAR PER PT   Headache    Hypertension    Myocardial infarction (HCC)    AGE 53   Sickle cell trait    Sleep apnea     Family History: Family History  Adopted: Yes      Review of Systems   Vital Signs: BP 110/70   Pulse 76   Temp 98 F (36.7 C)   Resp 16   Ht 5' 8.5 (1.74 m)   Wt 296 lb (134.3 kg)   LMP 11/23/2018   SpO2 97%   BMI 44.35 kg/m    Physical Exam   LABS: Recent Results (from the past 2160 hours)  Basic metabolic panel     Status: Abnormal   Collection Time: 11/24/23 10:21 AM  Result Value Ref Range   Sodium 138 135 - 145 mmol/L   Potassium 3.9 3.5 - 5.1 mmol/L   Chloride 101 98 - 111 mmol/L   CO2 26 22 - 32 mmol/L   Glucose, Bld 171 (H) 70 - 99 mg/dL    Comment: Glucose reference range applies only to samples taken after fasting for at least 8 hours.   BUN 17 6 - 20 mg/dL   Creatinine, Ser 9.06 0.44 - 1.00 mg/dL   Calcium 9.5 8.9 - 89.6 mg/dL   GFR, Estimated >39 >39 mL/min    Comment: (NOTE) Calculated using the CKD-EPI Creatinine Equation (2021)    Anion gap 11 5 - 15    Comment: Performed at [facility], [address] [place], [place] [address]  CBC     Status: Abnormal   Collection Time: 11/24/23 10:21 AM  Result Value Ref Range   WBC 8.3 4.0 - 10.5 K/uL   RBC 5.39 (H) 3.87 - 5.11 MIL/uL   Hemoglobin 13.8 12.0 - 15.0 g/dL   HCT 56.5 63.9 -  53.9 %   MCV 80.5 80.0 - 100.0 fL   MCH 25.6 (L) 26.0 - 34.0 pg   MCHC 31.8 30.0 - 36.0 g/dL   RDW 84.5 88.4 - 84.4 %   Platelets 370 150 - 400 K/uL   nRBC 0.0 0.0 - 0.2 %    Comment: Performed at [facility], [address], [place], [place] [address]  Troponin I (High Sensitivity)     Status: None   Collection Time: 11/24/23 10:21 AM  Result  Value Ref Range   Troponin I (High Sensitivity) 3 <18 ng/L    Comment: (NOTE) Elevated high sensitivity troponin I (hsTnI) values and significant  changes across serial measurements may suggest ACS but many other  chronic and acute conditions are known to elevate hsTnI results.  Refer to the Links section for chest pain algorithms and additional  guidance. Performed at [facility], [address] [place], [place] [address]   TSH     Status: None   Collection Time: 11/24/23 10:21 AM  Result Value Ref Range   TSH 1.573 0.350 - 4.500 uIU/mL    Comment: Performed by a 3rd Generation assay with a functional sensitivity of <=0.01 uIU/mL. Performed at [facility], [address] [place], [place] [address]   T4, free     Status: Abnormal   Collection Time: 11/24/23 10:21 AM  Result Value Ref Range   Free T4 2.82 (H) 0.61 - 1.12 ng/dL    Comment: (NOTE) Biotin ingestion may interfere with free T4 tests. If the results are inconsistent with the TSH level, previous test results, or the clinical presentation, then consider biotin interference. If needed, order repeat testing after stopping biotin. Performed at [facility], [address] [place], [place] [address]   Magnesium     Status: None   Collection Time: 11/24/23 10:21 AM  Result Value Ref Range   Magnesium 2.1 1.7 - 2.4 mg/dL    Comment: Performed at [facility], [address] [place], [place] [address]  Troponin I (High Sensitivity)     Status: None   Collection Time: 11/24/23 12:20 PM  Result Value Ref Range   Troponin I  (High Sensitivity) 3 <18 ng/L    Comment: (NOTE) Elevated high sensitivity troponin I (hsTnI) values and significant  changes across serial measurements may suggest ACS but many other  chronic and acute conditions are known to elevate hsTnI results.  Refer to the Links section for chest pain algorithms and additional  guidance. Performed at [facility], [address], [place], [place] [address]     .MAMM    Assessment/Plan:   General Counseling: Destiny Singh verbalizes understanding of the findings of todays visit and agrees with plan of treatment. I have discussed any further diagnostic evaluation that may be needed or ordered today. We also reviewed her medications today. she has been encouraged to call the office with any questions or concerns that should arise related to todays visit.    Counseling:    Orders Placed This Encounter  Procedures   UA/M w/rflx Culture, Routine   Urine Microalbumin w/creat. ratio    No orders of the defined types were placed in this encounter.   This patient was seen by Tinnie Pro, PA-C in collaboration with Dr. Sigrid Bathe as a part of collaborative care agreement.  Total time spent:*** Minutes  Time spent includes review of chart, medications, test results, and follow up plan with the patient.     Sigrid CHRISTELLA Bathe, MD  Internal Medicine

## 2024-02-21 LAB — UA/M W/RFLX CULTURE, ROUTINE
Bilirubin, UA: NEGATIVE
Glucose, UA: NEGATIVE
Nitrite, UA: NEGATIVE
RBC, UA: NEGATIVE
Specific Gravity, UA: 1.023 (ref 1.005–1.030)
Urobilinogen, Ur: 1 mg/dL (ref 0.2–1.0)
pH, UA: 5.5 (ref 5.0–7.5)

## 2024-02-21 LAB — URINE CULTURE, REFLEX

## 2024-02-21 LAB — MICROSCOPIC EXAMINATION
Bacteria, UA: NONE SEEN
RBC, Urine: NONE SEEN /HPF (ref 0–2)

## 2024-02-21 LAB — MICROALBUMIN / CREATININE URINE RATIO
Creatinine, Urine: 308.2 mg/dL
Microalb/Creat Ratio: 15 mg/g{creat} (ref 0–29)
Microalbumin, Urine: 44.9 ug/mL

## 2024-03-16 ENCOUNTER — Ambulatory Visit: Admitting: Physician Assistant

## 2024-03-16 ENCOUNTER — Encounter: Payer: Self-pay | Admitting: Physician Assistant

## 2024-03-16 VITALS — BP 132/80 | HR 88 | Temp 97.8°F | Resp 16 | Ht 68.5 in | Wt 295.0 lb

## 2024-03-16 DIAGNOSIS — M79661 Pain in right lower leg: Secondary | ICD-10-CM | POA: Diagnosis not present

## 2024-03-16 DIAGNOSIS — E782 Mixed hyperlipidemia: Secondary | ICD-10-CM | POA: Diagnosis not present

## 2024-03-16 DIAGNOSIS — R079 Chest pain, unspecified: Secondary | ICD-10-CM | POA: Diagnosis not present

## 2024-03-16 DIAGNOSIS — R002 Palpitations: Secondary | ICD-10-CM

## 2024-03-16 DIAGNOSIS — E538 Deficiency of other specified B group vitamins: Secondary | ICD-10-CM | POA: Diagnosis not present

## 2024-03-16 DIAGNOSIS — R5383 Other fatigue: Secondary | ICD-10-CM

## 2024-03-16 DIAGNOSIS — M7989 Other specified soft tissue disorders: Secondary | ICD-10-CM | POA: Diagnosis not present

## 2024-03-16 DIAGNOSIS — R7989 Other specified abnormal findings of blood chemistry: Secondary | ICD-10-CM | POA: Diagnosis not present

## 2024-03-16 DIAGNOSIS — E119 Type 2 diabetes mellitus without complications: Secondary | ICD-10-CM

## 2024-03-16 DIAGNOSIS — E559 Vitamin D deficiency, unspecified: Secondary | ICD-10-CM | POA: Diagnosis not present

## 2024-03-16 DIAGNOSIS — J4520 Mild intermittent asthma, uncomplicated: Secondary | ICD-10-CM | POA: Diagnosis not present

## 2024-03-16 DIAGNOSIS — R0602 Shortness of breath: Secondary | ICD-10-CM

## 2024-03-16 LAB — POCT GLYCOSYLATED HEMOGLOBIN (HGB A1C): Hemoglobin A1C: 7.4 % — AB (ref 4.0–5.6)

## 2024-03-16 MED ORDER — TIRZEPATIDE 2.5 MG/0.5ML ~~LOC~~ SOAJ: 2.5000 mg | SUBCUTANEOUS | 2 refills | Status: AC

## 2024-03-16 NOTE — Progress Notes (Signed)
 [facility] [address] [place], [place] [address]  Internal MEDICINE  Office Visit Note  Patient Name: Destiny Singh  [id number]  [id number]  Date of Service: 03/16/2024  Chief Complaint  Patient presents with   Follow-up   Quality Metric Gaps    Eye exam    HPI Pt is here for routine follow up -BP stable -would like to restart mounjaro  with new insurance active now -still can't take deep breaths since sept when dx with bronchitis. CTA done at the start of this and was negative. Has been a little better, but this has still lingered -Still having palpitations, still has intermittent substernal CP that is dull -she is ready to move forward with zio and echo now -will recheck labs, advised to hold biotin before lab draw -reports right knee pain and swelling for the last 3 weeks. No known injury. On further discussion she states it hurts behind the knee.  -will schedule eye exam  Current Medication: Outpatient Encounter Medications as of 03/16/2024  Medication Sig   albuterol  (PROVENTIL  HFA) 108 (90 Base) MCG/ACT inhaler Inhale 2 puffs into the lungs every 4 (four) hours as needed for wheezing or shortness of breath.   ASHWAGANDHA 35 PO Take by mouth.   benzonatate  (TESSALON  PERLES) 100 MG capsule Take 1 capsule (100 mg total) by mouth 3 (three) times daily as needed for cough.   cholecalciferol (VITAMIN D3) 25 MCG (1000 UNIT) tablet Take 1,000 Units by mouth daily.   cyanocobalamin  (,VITAMIN B-12,) 1000 MCG/ML injection Inject 1,000 mcg into the muscle See admin instructions. Every 2 months   ERGOCALCIFEROL  PO Take by mouth.   ibuprofen  (ADVIL ) 800 MG tablet Take 1 tablet (800 mg total) by mouth every 8 (eight) hours as needed for mild pain.   losartan -hydrochlorothiazide  (HYZAAR) 100-25 MG tablet Take 1 tablet by mouth daily.   metoprolol  tartrate (LOPRESSOR ) 50 MG tablet Take 1 tablet (50 mg total) by mouth 2 (two) times daily.   Misc Natural Products (PUMPKIN SEED  OIL) CAPS Take by mouth in the morning, at noon, and at bedtime.   nortriptyline (PAMELOR) 50 MG capsule Take 50 mg by mouth at bedtime.   pregabalin (LYRICA) 50 MG capsule Take 50 mg by mouth 2 (two) times daily.   tirzepatide  (MOUNJARO ) 2.5 MG/0.5ML Pen Inject 2.5 mg into the skin once a week.   [DISCONTINUED] azithromycin  (ZITHROMAX  Z-PAK) 250 MG tablet Take 2 tablets (500 mg) on  Tregoning 1,  followed by 1 tablet (250 mg) once daily on Days 2 through 5.   No facility-administered encounter medications on file as of 03/16/2024.    Surgical History: Past Surgical History:  Procedure Laterality Date   BREAST EXCISIONAL BIOPSY     in teens per pt   BUNIONECTOMY Right 10/01/2021   Procedure: BUNIONECTOMY - LAPIDUS - TYPE;  Surgeon: Lennie Barter, DPM;  Location: [facility];  Service: Podiatry;  Laterality: Right;   CALCANEAL OSTEOTOMY Right 10/01/2021   Procedure: EVANS/MCDO;  Surgeon: Lennie Barter, DPM;  Location: [facility];  Service: Podiatry;  Laterality: Right;   CHONDROPLASTY Right 04/01/2016   Procedure: CHONDROPLASTY -ABRASION OF FEMORAL TROCHLEA, LATERAL TIBIA Plateau;  Surgeon: Norleen JINNY Maltos, MD;  Location: [facility];  Service: Orthopedics;  Laterality: Right;   COLONOSCOPY WITH PROPOFOL  N/A 08/18/2023   Procedure: COLONOSCOPY WITH PROPOFOL ;  Surgeon: Jinny Carmine, MD;  Location: [facility];  Service: Endoscopy;  Laterality: N/A;   GASTROC RECESSION EXTREMITY Right 10/01/2021   Procedure: GASTROC  RECESSION;  Surgeon: Lennie Barter, DPM;  Location: [facility];  Service: Podiatry;  Laterality: Right;   KNEE ARTHROSCOPY Left 06/18/2020   Procedure: LEFT KNEE ARTHROSCOPY WITH DEBRIDEMENT AND PARTIAL MEDIAL MENISCECTOMY;  Surgeon: Edie Norleen PARAS, MD;  Location: [facility];  Service: Orthopedics;  Laterality: Left;   KNEE ARTHROSCOPY WITH MENISCAL REPAIR Right 04/01/2016   Procedure: KNEE ARTHROSCOPY WITH PARTIAL MEDIAL  MENISCAL REPAIR;  Surgeon: Norleen PARAS Edie, MD;   Location: [facility];  Service: Orthopedics;  Laterality: Right;   POLYPECTOMY  08/18/2023   Procedure: POLYPECTOMY, INTESTINE;  Surgeon: Jinny Carmine, MD;  Location: [facility];  Service: Endoscopy;;   TENDON TRANSFER Right 10/01/2021   Procedure: FDL TRANSFER; DEEP;  Surgeon: Lennie Barter, DPM;  Location: [facility];  Service: Podiatry;  Laterality: Right;   TENOTOMY / FLEXOR TENDON TRANSFER Right 10/01/2021   Procedure: FLEXOR TENDON REPAIR;  Surgeon: Lennie Barter, DPM;  Location: [facility];  Service: Podiatry;  Laterality: Right;   TUBAL LIGATION     TUMOR EXCISION  2016   BENIGN-HEAD    Medical History: Past Medical History:  Diagnosis Date   Adopted    Arthritis    BOTH KNEES   Asthma    Benign tumor    Diabetes mellitus without complication (HCC)    Dyspnea    VERY RARE   Dysrhythmia    IRREGULAR PER PT   Headache    Hypertension    Myocardial infarction (HCC)    AGE 89   Sickle cell trait    Sleep apnea     Family History: Family History  Adopted: Yes    Social History   Socioeconomic History   Marital status: Single    Spouse name: Not on file   Number of children: Not on file   Years of education: Not on file   Highest education level: Not on file  Occupational History   Not on file  Tobacco Use   Smoking status: Never   Smokeless tobacco: Never  Vaping Use   Vaping status: Never Used  Substance and Sexual Activity   Alcohol use: No   Drug use: No   Sexual activity: Not on file  Other Topics Concern   Not on file  Social History Narrative   Not on file   Social Drivers of Health   Tobacco Use: Low Risk (03/16/2024)   Patient History    Smoking Tobacco Use: Never    Smokeless Tobacco Use: Never    Passive Exposure: Not on file  Financial Resource Strain: Low Risk  (06/08/2023)   Received from [facility]   Overall Financial Resource Strain (CARDIA)    Difficulty of Paying Living Expenses: Not hard at  all  Food Insecurity: No Food Insecurity (06/08/2023)   Received from [facility]   Epic    Within the past 12 months, you worried that your food would run out before you got the money to buy more.: Never true    Within the past 12 months, the food you bought just didn't last and you didn't have money to get more.: Never true  Transportation Needs: No Transportation Needs (06/08/2023)   Received from [facility]    In the past 12 months, has lack of transportation kept you from medical appointments or from getting medications?: No    Lack of Transportation (Non-Medical): No  Physical Activity: Not on file  Stress: Not on  file  Social Connections: Not on file  Intimate Partner Violence: Not on file  Depression [phone]): Low Risk (03/16/2024)   Depression (PHQ2-9)    PHQ-2 Score: 0  Alcohol Screen: Not on file  Housing: Low Risk  (06/08/2023)   Received from [facility]   Epic    In the last 12 months, was there a time when you were not able to pay the mortgage or rent on time?: No    In the past 12 months, how many times have you moved where you were living?: 0    At any time in the past 12 months, were you homeless or living in a shelter (including now)?: No  Utilities: Not At Risk (06/08/2023)   Received from [facility]ities    Threatened with loss of utilities: No  Health Literacy: Not on file      Review of Systems  Constitutional:  Negative for chills, fatigue and unexpected weight change.  HENT:  Positive for postnasal drip. Negative for congestion, rhinorrhea, sneezing and sore throat.   Eyes:  Negative for redness.  Respiratory:  Negative for cough and chest tightness.        SOBOE and trouble taking deep breaths  Cardiovascular:  Positive for palpitations and leg swelling.       Intermittent CP  Gastrointestinal:  Negative for constipation, diarrhea, nausea and  vomiting.  Genitourinary:  Negative for dysuria and frequency.  Musculoskeletal:  Negative for arthralgias, back pain, joint swelling and neck pain.  Skin:  Negative for color change, pallor and rash.  Neurological: Negative.  Negative for tremors and numbness.  Hematological:  Negative for adenopathy. Does not bruise/bleed easily.  Psychiatric/Behavioral:  Negative for behavioral problems (Depression), sleep disturbance and suicidal ideas. The patient is not nervous/anxious.     Vital Signs: BP 132/80   Pulse 88   Temp 97.8 F (36.6 C)   Resp 16   Ht 5' 8.5 (1.74 m)   Wt 295 lb (133.8 kg)   LMP 11/23/2018   SpO2 95%   BMI 44.20 kg/m    Physical Exam Vitals and nursing note reviewed.  Constitutional:      Appearance: Normal appearance.  HENT:     Head: Normocephalic and atraumatic.  Eyes:     Extraocular Movements: Extraocular movements intact.  Cardiovascular:     Rate and Rhythm: Normal rate and regular rhythm.  Pulmonary:     Effort: Pulmonary effort is normal.     Breath sounds: Normal breath sounds. No wheezing.  Musculoskeletal:        General: Swelling and tenderness present.     Comments: Pain and swelling behind right knee  Skin:    General: Skin is warm and dry.  Neurological:     Mental Status: She is alert and oriented to person, place, and time.  Psychiatric:        Mood and Affect: Mood normal.        Behavior: Behavior normal.        Assessment/Plan: 1. Pain and swelling of lower leg, right (Primary) Will order stat US  to r/o DVT, advised to go to ED if new or worsening symptoms.  - VAS US  LOWER EXTREMITY VENOUS (DVT); Future  2. Type 2 diabetes mellitus without complication, without long-term current use of insulin (HCC) - POCT HgB A1C is 7.4, will plan to restart mounjaro  pending labs - tirzepatide  (MOUNJARO ) 2.5 MG/0.5ML Pen; Inject 2.5 mg into the skin  once a week.  Dispense: 2 mL; Refill: 2  3. Palpitations Will order zio monitor and  echo, may need cardiology in future. Advised to go to ED if new or worsening symptoms - LONG TERM MONITOR XT (3-14 DAYS); Future - ECHOCARDIOGRAM COMPLETE; Future - LONG TERM MONITOR XT (3-14 DAYS)  4. Intermittent chest pain Will order zio monitor and echo, may need cardiology in future. Advised to go to ED if new or worsening symptoms  5. SOB (shortness of breath) Ongoing since bronchitis dx in Sept. Will check Chest xray given ongoing SOB as well as PFT and echo. CTA negative in Sept when this first started however given leg pain and swelling will be having US  for possible DVT and may need updated CT as well. Go to ED if new or worsening sypmtoms - Pulmonary function test; Future - ECHOCARDIOGRAM COMPLETE; Future - DG Chest 2 View; Future  6. Mild intermittent asthma without complication - Pulmonary function test; Future - DG Chest 2 View; Future  7. Elevated serum free T4 level Will recheck labs after holding biotin supplement - TSH + free T4  8. B12 deficiency - B12 and Folate Panel  9. Mixed hyperlipidemia - Lipid Panel With LDL/HDL Ratio  10. Vitamin D  deficiency - VITAMIN D  25 Hydroxy (Vit-D Deficiency, Fractures)  11. Other fatigue - CBC w/Diff/Platelet - Comprehensive metabolic panel with GFR - TSH + free T4 - Lipid Panel With LDL/HDL Ratio - B12 and Folate Panel - VITAMIN D  25 Hydroxy (Vit-D Deficiency, Fractures) - Fe+TIBC+Fer   General Counseling: Sanayah verbalizes understanding of the findings of todays visit and agrees with plan of treatment. I have discussed any further diagnostic evaluation that may be needed or ordered today. We also reviewed her medications today. she has been encouraged to call the office with any questions or concerns that should arise related to todays visit.    Orders Placed This Encounter  Procedures   DG Chest 2 View   CBC w/Diff/Platelet   Comprehensive metabolic panel with GFR   TSH + free T4   Lipid Panel With LDL/HDL  Ratio   B12 and Folate Panel   VITAMIN D  25 Hydroxy (Vit-D Deficiency, Fractures)   Fe+TIBC+Fer   LONG TERM MONITOR XT (3-14 DAYS)   POCT HgB A1C   ECHOCARDIOGRAM COMPLETE   Pulmonary function test   VAS US  LOWER EXTREMITY VENOUS (DVT)    Meds ordered this encounter  Medications   tirzepatide  (MOUNJARO ) 2.5 MG/0.5ML Pen    Sig: Inject 2.5 mg into the skin once a week.    Dispense:  2 mL    Refill:  2    This patient was seen by Tinnie Pro, PA-C in collaboration with Dr. Sigrid Bathe as a part of collaborative care agreement.   Total time spent:45 Minutes Time spent includes review of chart, medications, test results, and follow up plan with the patient.      Dr Fozia M Khan Internal medicine

## 2024-03-22 ENCOUNTER — Other Ambulatory Visit (INDEPENDENT_AMBULATORY_CARE_PROVIDER_SITE_OTHER)

## 2024-03-22 ENCOUNTER — Ambulatory Visit
Admission: RE | Admit: 2024-03-22 | Discharge: 2024-03-22 | Disposition: A | Discharge status: Home / Self Care | Source: Ambulatory Visit | Attending: Physician Assistant | Admitting: Physician Assistant

## 2024-03-22 ENCOUNTER — Ambulatory Visit
Admission: RE | Admit: 2024-03-22 | Discharge: 2024-03-22 | Disposition: A | Source: Ambulatory Visit | Attending: Physician Assistant | Admitting: Physician Assistant

## 2024-03-22 DIAGNOSIS — J4520 Mild intermittent asthma, uncomplicated: Secondary | ICD-10-CM | POA: Diagnosis present

## 2024-03-22 DIAGNOSIS — R0602 Shortness of breath: Secondary | ICD-10-CM

## 2024-03-22 DIAGNOSIS — M79661 Pain in right lower leg: Secondary | ICD-10-CM | POA: Diagnosis not present

## 2024-03-22 DIAGNOSIS — M7989 Other specified soft tissue disorders: Secondary | ICD-10-CM

## 2024-03-22 LAB — B12 AND FOLATE PANEL
Folate: 17.3 ng/mL
Vitamin B-12: 408 pg/mL (ref 232–1245)

## 2024-03-22 LAB — COMPREHENSIVE METABOLIC PANEL WITH GFR
ALT: 16 IU/L (ref 0–32)
AST: 15 IU/L (ref 0–40)
Albumin: 4.1 g/dL (ref 3.8–4.9)
Alkaline Phosphatase: 108 IU/L (ref 49–135)
BUN/Creatinine Ratio: 12 (ref 9–23)
BUN: 11 mg/dL (ref 6–24)
Bilirubin Total: 0.4 mg/dL (ref 0.0–1.2)
CO2: 24 mmol/L (ref 20–29)
Calcium: 9.6 mg/dL (ref 8.7–10.2)
Chloride: 104 mmol/L (ref 96–106)
Creatinine, Ser: 0.9 mg/dL (ref 0.57–1.00)
Globulin, Total: 2.9 g/dL (ref 1.5–4.5)
Glucose: 117 mg/dL — ABNORMAL HIGH (ref 70–99)
Potassium: 4.4 mmol/L (ref 3.5–5.2)
Sodium: 141 mmol/L (ref 134–144)
Total Protein: 7 g/dL (ref 6.0–8.5)
eGFR: 75 mL/min/1.73

## 2024-03-22 LAB — CBC WITH DIFFERENTIAL/PLATELET
Basophils Absolute: 0 x10E3/uL (ref 0.0–0.2)
Basos: 1 %
EOS (ABSOLUTE): 0.2 x10E3/uL (ref 0.0–0.4)
Eos: 3 %
Hematocrit: 43.2 % (ref 34.0–46.6)
Hemoglobin: 13.4 g/dL (ref 11.1–15.9)
Immature Grans (Abs): 0 x10E3/uL (ref 0.0–0.1)
Immature Granulocytes: 0 %
Lymphocytes Absolute: 2.7 x10E3/uL (ref 0.7–3.1)
Lymphs: 35 %
MCH: 25 pg — ABNORMAL LOW (ref 26.6–33.0)
MCHC: 31 g/dL — ABNORMAL LOW (ref 31.5–35.7)
MCV: 80 fL (ref 79–97)
Monocytes Absolute: 0.5 x10E3/uL (ref 0.1–0.9)
Monocytes: 6 %
Neutrophils Absolute: 4.4 x10E3/uL (ref 1.4–7.0)
Neutrophils: 55 %
Platelets: 406 x10E3/uL (ref 150–450)
RBC: 5.37 x10E6/uL — ABNORMAL HIGH (ref 3.77–5.28)
RDW: 15.7 % — ABNORMAL HIGH (ref 11.7–15.4)
WBC: 7.8 x10E3/uL (ref 3.4–10.8)

## 2024-03-22 LAB — IRON,TIBC AND FERRITIN PANEL
Ferritin: 79 ng/mL (ref 15–150)
Iron Saturation: 14 % — ABNORMAL LOW (ref 15–55)
Iron: 44 ug/dL (ref 27–159)
Total Iron Binding Capacity: 312 ug/dL (ref 250–450)
UIBC: 268 ug/dL (ref 131–425)

## 2024-03-22 LAB — LIPID PANEL WITH LDL/HDL RATIO
Cholesterol, Total: 112 mg/dL (ref 100–199)
HDL: 56 mg/dL
LDL Chol Calc (NIH): 39 mg/dL (ref 0–99)
LDL/HDL Ratio: 0.7 ratio (ref 0.0–3.2)
Triglycerides: 85 mg/dL (ref 0–149)
VLDL Cholesterol Cal: 17 mg/dL (ref 5–40)

## 2024-03-22 LAB — TSH+FREE T4
Free T4: 1.18 ng/dL (ref 0.82–1.77)
TSH: 1.06 u[IU]/mL (ref 0.450–4.500)

## 2024-03-22 LAB — VITAMIN D 25 HYDROXY (VIT D DEFICIENCY, FRACTURES): Vit D, 25-Hydroxy: 61.1 ng/mL (ref 30.0–100.0)

## 2024-03-27 ENCOUNTER — Ambulatory Visit: Payer: Self-pay | Admitting: Physician Assistant

## 2024-03-28 NOTE — Telephone Encounter (Signed)
 LVM regarding lab results/US  results.

## 2024-03-28 NOTE — Telephone Encounter (Signed)
-----   Message from Tinnie MARLA Pro sent at 03/27/2024 12:40 PM EST ----- Please let her know that iron levels are a little low and should supplement otherwise labs stable. US  was negative for DVT

## 2024-03-28 NOTE — Progress Notes (Signed)
Please let her know chest x-ray negative.

## 2024-03-29 ENCOUNTER — Ambulatory Visit: Admission: RE | Admit: 2024-03-29 | Source: Ambulatory Visit

## 2024-04-05 ENCOUNTER — Telehealth: Payer: Self-pay

## 2024-04-05 NOTE — Telephone Encounter (Signed)
Sent appeal for patient's Mounjaro.

## 2024-04-11 ENCOUNTER — Encounter: Admitting: Internal Medicine

## 2024-04-12 ENCOUNTER — Encounter: Payer: Self-pay | Admitting: Physician Assistant

## 2024-04-12 ENCOUNTER — Ambulatory Visit: Admitting: Physician Assistant

## 2024-04-12 VITALS — BP 140/95 | HR 89 | Temp 98.0°F | Resp 16 | Ht 68.5 in | Wt 299.0 lb

## 2024-04-12 DIAGNOSIS — E611 Iron deficiency: Secondary | ICD-10-CM

## 2024-04-12 DIAGNOSIS — M545 Low back pain, unspecified: Secondary | ICD-10-CM

## 2024-04-12 MED ORDER — CYCLOBENZAPRINE HCL 10 MG PO TABS: 10.0000 mg | ORAL_TABLET | Freq: Three times a day (TID) | ORAL | 1 refills | Status: AC | PRN

## 2024-04-12 NOTE — Progress Notes (Signed)
 " [facility] [address] [place], [place] [address]  Internal MEDICINE  Office Visit Note  Patient Name: Destiny Singh  [id number]  [id number]  Date of Service: 04/12/2024  Chief Complaint  Patient presents with   Acute Visit   Muscle Pain    Pulled muscle in back     HPI Pt is here for a sick visit. -about a week ago started to feel pain in left mid-low back. 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 Started to become more noticeable once she started to relax after work -has been using icyhot and tylenol , but not helping anymore -pain localized, does not radiate into leg or around to front/groin, tender to touch, no bruising, rash or skin changes -no falls, did have a slip in snow, but held on to husband and did not fall or injure herself during this -urinating normally--no odor, burning, or pelvic pain, BM normal -has not gotten mounjaro  and is under appeal, she is contacting insurance -last [place] of zio, though states it did fall off twice, but was able to put it back on -she is starting on iron supplement now since labs showed this low and she thinks this may be contributing to her fatigue, SOB  Current Medication:  Outpatient Encounter Medications as of 04/12/2024  Medication Sig   albuterol  (PROVENTIL  HFA) 108 (90 Base) MCG/ACT inhaler Inhale 2 puffs into the lungs every 4 (four) hours as needed for wheezing or shortness of breath.   ASHWAGANDHA 35 PO Take by mouth.   benzonatate  (TESSALON  PERLES) 100 MG capsule Take 1 capsule (100 mg total) by mouth 3 (three) times daily as needed for cough.   cholecalciferol (VITAMIN D3) 25 MCG (1000 UNIT) tablet Take 1,000 Units by mouth daily.   cyanocobalamin  (,VITAMIN B-12,) 1000 MCG/ML injection Inject 1,000 mcg into the muscle See admin instructions. Every 2 months   cyclobenzaprine  (FLEXERIL ) 10 MG tablet Take 1 tablet (10 mg total) by mouth 3 (three) times daily as needed for muscle  spasms.   ERGOCALCIFEROL  PO Take by mouth.   ibuprofen  (ADVIL ) 800 MG tablet Take 1 tablet (800 mg total) by mouth every 8 (eight) hours as needed for mild pain.   losartan -hydrochlorothiazide  (HYZAAR) 100-25 MG tablet Take 1 tablet by mouth daily.   metoprolol  tartrate (LOPRESSOR ) 50 MG tablet Take 1 tablet (50 mg total) by mouth 2 (two) times daily.   Misc Natural Products (PUMPKIN SEED OIL) CAPS Take by mouth in the morning, at noon, and at bedtime.   nortriptyline (PAMELOR) 50 MG capsule Take 50 mg by mouth at bedtime.   pregabalin (LYRICA) 50 MG capsule Take 50 mg by mouth 2 (two) times daily.   tirzepatide  (MOUNJARO ) 2.5 MG/0.5ML Pen Inject 2.5 mg into the skin once a week.   No facility-administered encounter medications on file as of 04/12/2024.      Medical History: Past Medical History:  Diagnosis Date   Adopted    Arthritis    BOTH KNEES   Asthma    Benign tumor    Diabetes mellitus without complication (HCC)    Dyspnea    VERY RARE   Dysrhythmia    IRREGULAR PER PT   Headache    Hypertension    Myocardial infarction (HCC)    AGE 26   Sickle cell trait    Sleep apnea      Vital Signs: BP (!) 140/95   Pulse 89   Temp 98 F (  36.7 C)   Resp 16   Ht 5' 8.5 (1.74 m)   Wt 299 lb (135.6 kg)   LMP 11/23/2018   SpO2 97%   BMI 44.80 kg/m    Review of Systems  Constitutional:  Positive for fatigue. Negative for fever.  HENT:  Negative for congestion, mouth sores and postnasal drip.   Respiratory:  Negative for wheezing.   Cardiovascular:  Negative for chest pain.  Gastrointestinal:  Negative for abdominal pain, blood in stool, constipation and diarrhea.  Genitourinary:  Negative for decreased urine volume, difficulty urinating, dysuria, frequency, pelvic pain and urgency.  Musculoskeletal:  Positive for back pain and myalgias.  Skin:  Negative for color change and rash.  Psychiatric/Behavioral: Negative.      Physical Exam Vitals and nursing note  reviewed.  Constitutional:      Appearance: Normal appearance.  HENT:     Head: Normocephalic and atraumatic.  Eyes:     Extraocular Movements: Extraocular movements intact.  Cardiovascular:     Rate and Rhythm: Normal rate and regular rhythm.     Comments: Zio monitor in place Pulmonary:     Effort: Pulmonary effort is normal.     Breath sounds: Normal breath sounds.  Musculoskeletal:        General: Tenderness present.     Comments: Tenderness to palpation over left mid-low back  Skin:    General: Skin is warm and dry.     Findings: No bruising or rash.  Neurological:     Mental Status: She is alert and oriented to person, place, and time.  Psychiatric:        Mood and Affect: Mood normal.        Behavior: Behavior normal.       Assessment/Plan: 1. Acute left-sided low back pain without sciatica (Primary) Will start on flexeril  as needed, cautioned on S/E including grogginess. Discussed possible prednisone , however pt would rather hold given diabetes and pt effort to lose weight. May consider if not improving though. Call if any new or worsening symptoms arise. - cyclobenzaprine  (FLEXERIL ) 10 MG tablet; Take 1 tablet (10 mg total) by mouth 3 (three) times daily as needed for muscle spasms.  Dispense: 30 tablet; Refill: 1  2. Low iron Will start on iron supplement    General Counseling: Kinzy verbalizes understanding of the findings of todays visit and agrees with plan of treatment. I have discussed any further diagnostic evaluation that may be needed or ordered today. We also reviewed her medications today. she has been encouraged to call the office with any questions or concerns that should arise related to todays visit.    Counseling:    No orders of the defined types were placed in this encounter.   Meds ordered this encounter  Medications   cyclobenzaprine  (FLEXERIL ) 10 MG tablet    Sig: Take 1 tablet (10 mg total) by mouth 3 (three) times daily as needed  for muscle spasms.    Dispense:  30 tablet    Refill:  1    Time spent:30 Minutes "

## 2024-04-13 ENCOUNTER — Inpatient Hospital Stay: Admission: RE | Admit: 2024-04-13

## 2024-04-13 DIAGNOSIS — R0602 Shortness of breath: Secondary | ICD-10-CM

## 2024-04-13 DIAGNOSIS — I10 Essential (primary) hypertension: Secondary | ICD-10-CM

## 2024-04-13 DIAGNOSIS — R002 Palpitations: Secondary | ICD-10-CM

## 2024-04-13 DIAGNOSIS — R0600 Dyspnea, unspecified: Secondary | ICD-10-CM

## 2024-04-13 LAB — ECHOCARDIOGRAM COMPLETE
AR max vel: 2.54 cm2
AV Area VTI: 2.69 cm2
AV Area mean vel: 2.58 cm2
AV Mean grad: 4 mmHg
AV Peak grad: 7.3 mmHg
Ao pk vel: 1.36 m/s
Area-P 1/2: 4.86 cm2
MV VTI: 2.84 cm2
S' Lateral: 3.2 cm

## 2024-04-13 NOTE — Progress Notes (Signed)
*  PRELIMINARY RESULTS* Echocardiogram 2D Echocardiogram has been performed.  Destiny Singh 04/13/2024, 9:58 AM

## 2024-04-23 ENCOUNTER — Ambulatory Visit: Admitting: Internal Medicine

## 2024-05-10 ENCOUNTER — Ambulatory Visit: Admitting: Physician Assistant

## 2024-05-23 ENCOUNTER — Encounter: Admitting: Internal Medicine

## 2024-06-04 ENCOUNTER — Ambulatory Visit: Admitting: Internal Medicine

## 2024-11-26 ENCOUNTER — Encounter: Admitting: Physician Assistant

## 2025-02-20 ENCOUNTER — Encounter: Admitting: Physician Assistant
